# Patient Record
Sex: Female | Born: 1935 | Race: White | Hispanic: No | Smoking: Former smoker
Health system: Southern US, Community
[De-identification: ages and names within clinical notes are randomized; demographics above are authoritative.]

## PROBLEM LIST (undated history)

## (undated) DIAGNOSIS — K219 Gastro-esophageal reflux disease without esophagitis: Secondary | ICD-10-CM

## (undated) DIAGNOSIS — R269 Unspecified abnormalities of gait and mobility: Secondary | ICD-10-CM

## (undated) DIAGNOSIS — S8263XA Displaced fracture of lateral malleolus of unspecified fibula, initial encounter for closed fracture: Secondary | ICD-10-CM

## (undated) DIAGNOSIS — E538 Deficiency of other specified B group vitamins: Secondary | ICD-10-CM

## (undated) DIAGNOSIS — C4490 Unspecified malignant neoplasm of skin, unspecified: Secondary | ICD-10-CM

## (undated) DIAGNOSIS — E079 Disorder of thyroid, unspecified: Secondary | ICD-10-CM

## (undated) DIAGNOSIS — G3184 Mild cognitive impairment, so stated: Secondary | ICD-10-CM

## (undated) DIAGNOSIS — K602 Anal fissure, unspecified: Secondary | ICD-10-CM

## (undated) DIAGNOSIS — F329 Major depressive disorder, single episode, unspecified: Secondary | ICD-10-CM

## (undated) DIAGNOSIS — E785 Hyperlipidemia, unspecified: Secondary | ICD-10-CM

## (undated) DIAGNOSIS — E119 Type 2 diabetes mellitus without complications: Secondary | ICD-10-CM

## (undated) DIAGNOSIS — H269 Unspecified cataract: Secondary | ICD-10-CM

## (undated) DIAGNOSIS — K439 Ventral hernia without obstruction or gangrene: Secondary | ICD-10-CM

## (undated) DIAGNOSIS — R42 Dizziness and giddiness: Secondary | ICD-10-CM

## (undated) DIAGNOSIS — K5900 Constipation, unspecified: Secondary | ICD-10-CM

## (undated) DIAGNOSIS — L280 Lichen simplex chronicus: Secondary | ICD-10-CM

## (undated) DIAGNOSIS — I10 Essential (primary) hypertension: Secondary | ICD-10-CM

## (undated) DIAGNOSIS — F32A Depression, unspecified: Secondary | ICD-10-CM

## (undated) DIAGNOSIS — K5730 Diverticulosis of large intestine without perforation or abscess without bleeding: Secondary | ICD-10-CM

## (undated) DIAGNOSIS — M1990 Unspecified osteoarthritis, unspecified site: Secondary | ICD-10-CM

## (undated) DIAGNOSIS — R5383 Other fatigue: Secondary | ICD-10-CM

## (undated) DIAGNOSIS — L408 Other psoriasis: Secondary | ICD-10-CM

## (undated) DIAGNOSIS — R5381 Other malaise: Secondary | ICD-10-CM

## (undated) DIAGNOSIS — R1314 Dysphagia, pharyngoesophageal phase: Secondary | ICD-10-CM

## (undated) DIAGNOSIS — N6019 Diffuse cystic mastopathy of unspecified breast: Secondary | ICD-10-CM

## (undated) DIAGNOSIS — D7589 Other specified diseases of blood and blood-forming organs: Secondary | ICD-10-CM

## (undated) DIAGNOSIS — N824 Other female intestinal-genital tract fistulae: Secondary | ICD-10-CM

## (undated) DIAGNOSIS — M545 Low back pain: Secondary | ICD-10-CM

## (undated) DIAGNOSIS — G4700 Insomnia, unspecified: Secondary | ICD-10-CM

## (undated) DIAGNOSIS — L639 Alopecia areata, unspecified: Secondary | ICD-10-CM

## (undated) HISTORY — DX: Major depressive disorder, single episode, unspecified: F32.9

## (undated) HISTORY — DX: Ventral hernia without obstruction or gangrene: K43.9

## (undated) HISTORY — DX: Anal fissure, unspecified: K60.2

## (undated) HISTORY — DX: Alopecia areata, unspecified: L63.9

## (undated) HISTORY — DX: Other malaise: R53.81

## (undated) HISTORY — DX: Other fatigue: R53.83

## (undated) HISTORY — DX: Mild cognitive impairment, so stated: G31.84

## (undated) HISTORY — DX: Type 2 diabetes mellitus without complications: E11.9

## (undated) HISTORY — PX: HERNIA REPAIR: SHX51

## (undated) HISTORY — DX: Displaced fracture of lateral malleolus of unspecified fibula, initial encounter for closed fracture: S82.63XA

## (undated) HISTORY — DX: Insomnia, unspecified: G47.00

## (undated) HISTORY — DX: Depression, unspecified: F32.A

## (undated) HISTORY — DX: Lichen simplex chronicus: L28.0

## (undated) HISTORY — DX: Gastro-esophageal reflux disease without esophagitis: K21.9

## (undated) HISTORY — DX: Constipation, unspecified: K59.00

## (undated) HISTORY — DX: Dysphagia, pharyngoesophageal phase: R13.14

## (undated) HISTORY — DX: Diffuse cystic mastopathy of unspecified breast: N60.19

## (undated) HISTORY — DX: Other psoriasis: L40.8

## (undated) HISTORY — DX: Hyperlipidemia, unspecified: E78.5

## (undated) HISTORY — DX: Low back pain: M54.5

## (undated) HISTORY — DX: Dizziness and giddiness: R42

## (undated) HISTORY — DX: Other specified diseases of blood and blood-forming organs: D75.89

## (undated) HISTORY — DX: Other female intestinal-genital tract fistulae: N82.4

## (undated) HISTORY — DX: Deficiency of other specified B group vitamins: E53.8

## (undated) HISTORY — DX: Diverticulosis of large intestine without perforation or abscess without bleeding: K57.30

## (undated) HISTORY — DX: Unspecified abnormalities of gait and mobility: R26.9

## (undated) HISTORY — PX: OTHER SURGICAL HISTORY: SHX169

---
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
## 1973-09-03 HISTORY — PX: BUNIONECTOMY: SHX129

## 1988-09-03 HISTORY — PX: CATARACT EXTRACTION W/ INTRAOCULAR LENS  IMPLANT, BILATERAL: SHX1307

## 1995-09-04 DIAGNOSIS — N6019 Diffuse cystic mastopathy of unspecified breast: Secondary | ICD-10-CM

## 1995-09-04 HISTORY — DX: Diffuse cystic mastopathy of unspecified breast: N60.19

## 1995-09-04 HISTORY — PX: BREAST BIOPSY: SHX20

## 1995-09-04 HISTORY — PX: BREAST LUMPECTOMY: SHX2

## 1996-09-03 HISTORY — PX: VAGINAL HYSTERECTOMY: SUR661

## 1999-01-03 LAB — HM DEXA SCAN

## 1999-09-04 DIAGNOSIS — M545 Low back pain, unspecified: Secondary | ICD-10-CM

## 1999-09-04 HISTORY — DX: Low back pain, unspecified: M54.50

## 1999-11-17 ENCOUNTER — Encounter: Admission: RE | Admit: 1999-11-17 | Discharge: 1999-11-17 | Payer: Self-pay | Admitting: *Deleted

## 1999-11-17 ENCOUNTER — Encounter: Payer: Self-pay | Admitting: *Deleted

## 2000-03-14 ENCOUNTER — Encounter: Admission: RE | Admit: 2000-03-14 | Discharge: 2000-03-14 | Payer: Self-pay | Admitting: Internal Medicine

## 2000-03-14 ENCOUNTER — Encounter: Payer: Self-pay | Admitting: Internal Medicine

## 2000-11-18 ENCOUNTER — Encounter: Payer: Self-pay | Admitting: *Deleted

## 2000-11-18 ENCOUNTER — Encounter: Admission: RE | Admit: 2000-11-18 | Discharge: 2000-11-18 | Payer: Self-pay | Admitting: *Deleted

## 2001-02-28 ENCOUNTER — Encounter: Admission: RE | Admit: 2001-02-28 | Discharge: 2001-02-28 | Payer: Self-pay | Admitting: Internal Medicine

## 2001-02-28 ENCOUNTER — Encounter: Payer: Self-pay | Admitting: Internal Medicine

## 2001-11-21 ENCOUNTER — Encounter: Payer: Self-pay | Admitting: Internal Medicine

## 2001-11-21 ENCOUNTER — Encounter: Admission: RE | Admit: 2001-11-21 | Discharge: 2001-11-21 | Payer: Self-pay | Admitting: Internal Medicine

## 2002-11-23 ENCOUNTER — Encounter: Admission: RE | Admit: 2002-11-23 | Discharge: 2002-11-23 | Payer: Self-pay | Admitting: Obstetrics and Gynecology

## 2002-11-23 ENCOUNTER — Encounter: Payer: Self-pay | Admitting: Obstetrics and Gynecology

## 2003-11-29 ENCOUNTER — Encounter: Admission: RE | Admit: 2003-11-29 | Discharge: 2003-11-29 | Payer: Self-pay | Admitting: Surgery

## 2004-08-14 ENCOUNTER — Encounter (INDEPENDENT_AMBULATORY_CARE_PROVIDER_SITE_OTHER): Payer: Self-pay | Admitting: *Deleted

## 2004-08-14 ENCOUNTER — Ambulatory Visit (HOSPITAL_COMMUNITY): Admission: RE | Admit: 2004-08-14 | Discharge: 2004-08-14 | Payer: Self-pay | Admitting: Gastroenterology

## 2004-12-05 ENCOUNTER — Encounter: Admission: RE | Admit: 2004-12-05 | Discharge: 2004-12-05 | Payer: Self-pay | Admitting: Surgery

## 2005-12-10 ENCOUNTER — Ambulatory Visit (HOSPITAL_COMMUNITY): Admission: RE | Admit: 2005-12-10 | Discharge: 2005-12-10 | Payer: Self-pay | Admitting: Internal Medicine

## 2005-12-11 ENCOUNTER — Encounter: Admission: RE | Admit: 2005-12-11 | Discharge: 2005-12-11 | Payer: Self-pay | Admitting: Surgery

## 2006-09-03 DIAGNOSIS — R269 Unspecified abnormalities of gait and mobility: Secondary | ICD-10-CM

## 2006-09-03 HISTORY — DX: Unspecified abnormalities of gait and mobility: R26.9

## 2007-01-28 ENCOUNTER — Encounter: Admission: RE | Admit: 2007-01-28 | Discharge: 2007-01-28 | Payer: Self-pay | Admitting: Surgery

## 2007-08-14 ENCOUNTER — Ambulatory Visit (HOSPITAL_COMMUNITY): Admission: RE | Admit: 2007-08-14 | Discharge: 2007-08-14 | Payer: Self-pay | Admitting: Internal Medicine

## 2007-09-09 ENCOUNTER — Encounter: Admission: RE | Admit: 2007-09-09 | Discharge: 2007-09-09 | Payer: Self-pay | Admitting: Gastroenterology

## 2007-09-23 ENCOUNTER — Ambulatory Visit (HOSPITAL_COMMUNITY): Admission: RE | Admit: 2007-09-23 | Discharge: 2007-09-23 | Payer: Self-pay | Admitting: Obstetrics and Gynecology

## 2007-10-02 ENCOUNTER — Ambulatory Visit (HOSPITAL_COMMUNITY): Admission: RE | Admit: 2007-10-02 | Discharge: 2007-10-02 | Payer: Self-pay | Admitting: Obstetrics and Gynecology

## 2007-10-15 ENCOUNTER — Ambulatory Visit (HOSPITAL_COMMUNITY): Admission: RE | Admit: 2007-10-15 | Discharge: 2007-10-15 | Payer: Self-pay | Admitting: Gastroenterology

## 2007-11-02 HISTORY — PX: COLECTOMY: SHX59

## 2007-11-06 ENCOUNTER — Inpatient Hospital Stay (HOSPITAL_COMMUNITY): Admission: RE | Admit: 2007-11-06 | Discharge: 2007-11-11 | Payer: Self-pay | Admitting: Surgery

## 2007-11-06 ENCOUNTER — Encounter (INDEPENDENT_AMBULATORY_CARE_PROVIDER_SITE_OTHER): Payer: Self-pay | Admitting: Surgery

## 2008-02-02 ENCOUNTER — Encounter: Admission: RE | Admit: 2008-02-02 | Discharge: 2008-02-02 | Payer: Self-pay | Admitting: Internal Medicine

## 2008-09-03 DIAGNOSIS — L280 Lichen simplex chronicus: Secondary | ICD-10-CM

## 2008-09-03 HISTORY — DX: Lichen simplex chronicus: L28.0

## 2009-02-03 ENCOUNTER — Encounter: Admission: RE | Admit: 2009-02-03 | Discharge: 2009-02-03 | Payer: Self-pay | Admitting: Surgery

## 2009-09-01 IMAGING — CT CT PELVIS W/ CM
3 of 5 series · 13 of 32 positions shown, 18 images · IV contrast ([ID] READICAT & [ID] OMNIP 300%)
Comparison: Noncontrast pelvic CT 09/23/07.

CLINICAL DATA: Colovaginal fistula.  Diverticulitis.  Follow-up.
ABDOMEN CT WITH CONTRAST:
TECHNIQUE: Multidetector CT imaging of the abdomen was performed following the standard protocol during bolus administration of intravenous contrast.
Contrast:  100 cc Omnipaque 300.  Oral contrast was given.
TECHNIQUE: Multidetector CT imaging of the pelvis was performed following the standard protocol during bolus administration of intravenous contrast.

[Series 2: abd pelvis · axial · 0.70mm/px · z∈[-369,-109]mm · 4 of 88 slices shown, 9 images]
[im 18/88  soft-tissue]
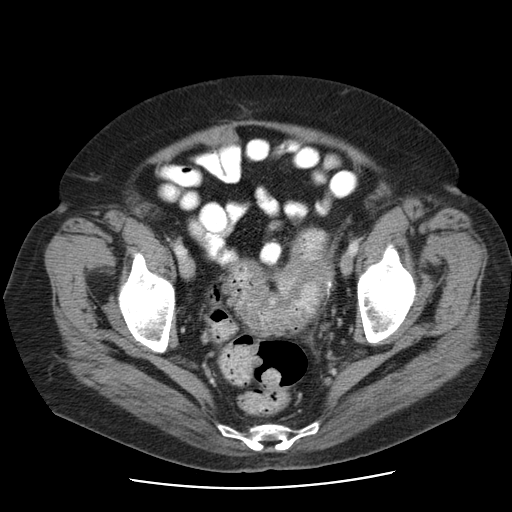
[im 18/88  lung]
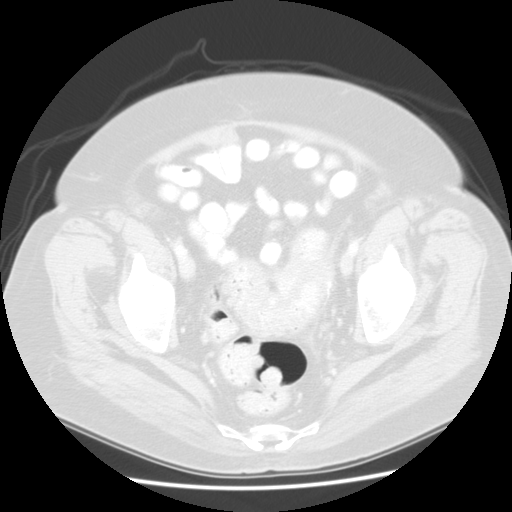
[im 18/88  bone]
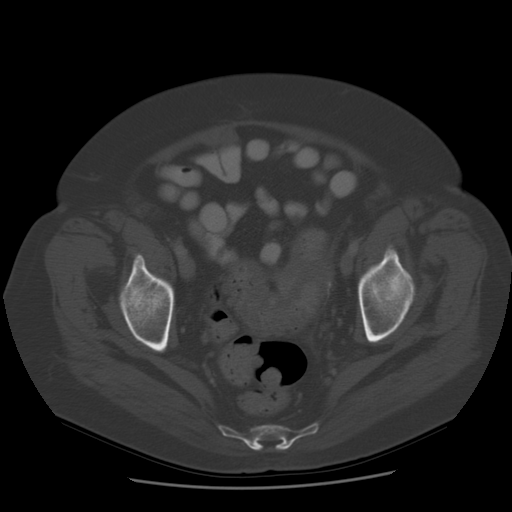
[im 35/88  soft-tissue]
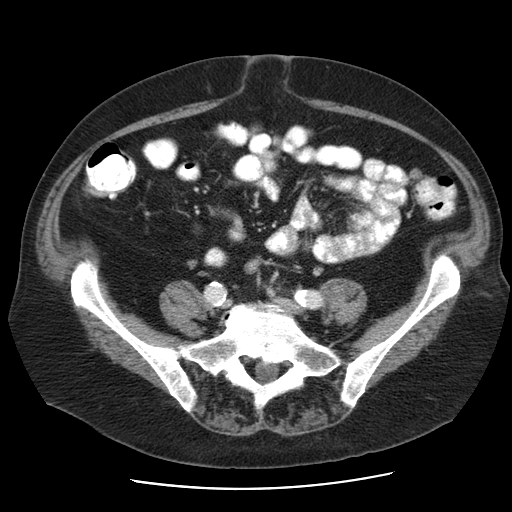
[im 35/88  lung]
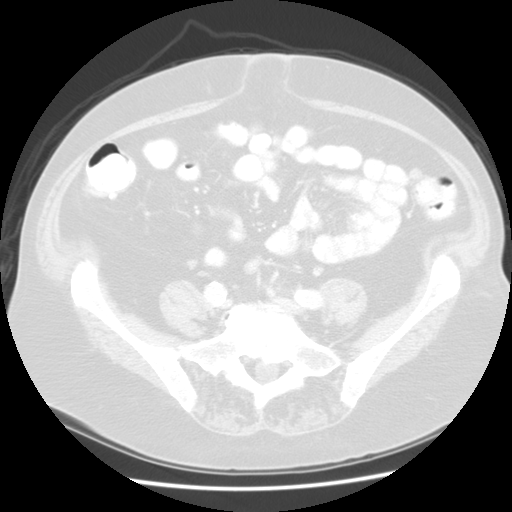
[im 53/88  soft-tissue]
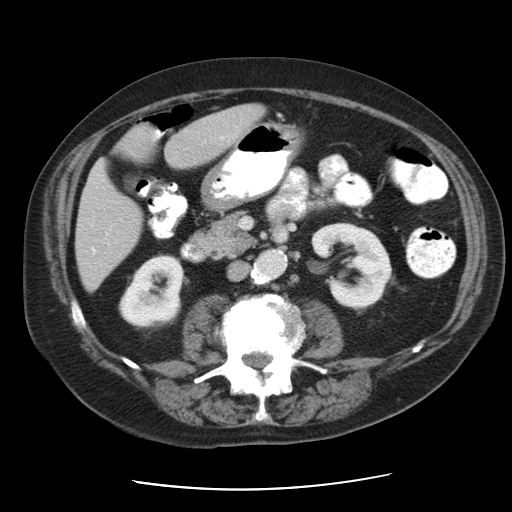
[im 53/88  lung]
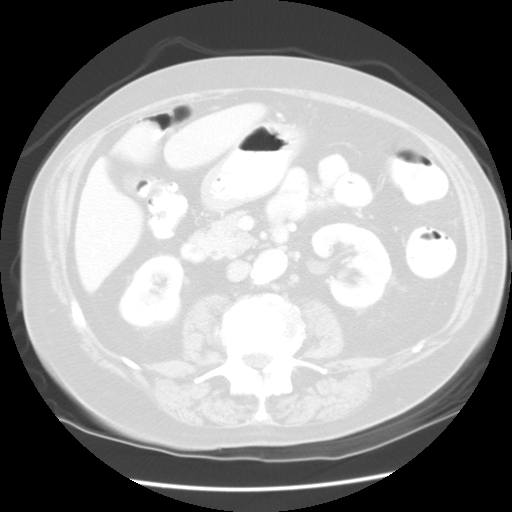
[im 70/88  soft-tissue]
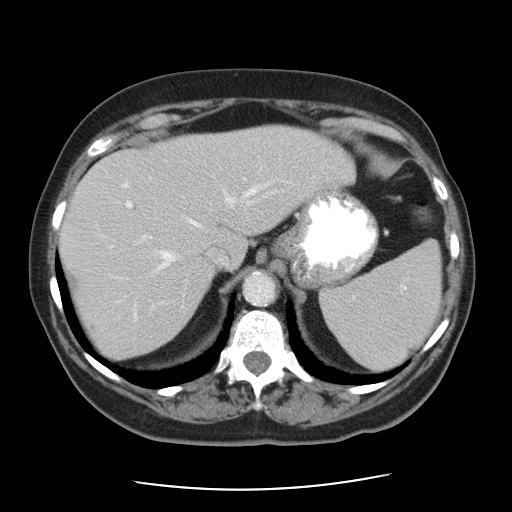
[im 70/88  lung]
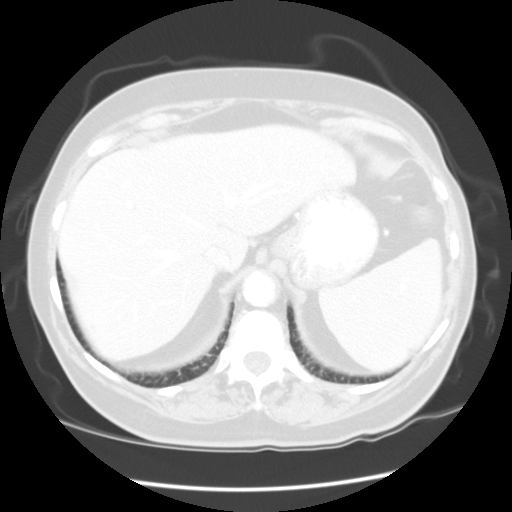

[Series 5: renal delay · axial · delayed · 0.70mm/px · z∈[-344,-234]mm · 2 of 68 slices shown]
[im 23/68  soft-tissue]
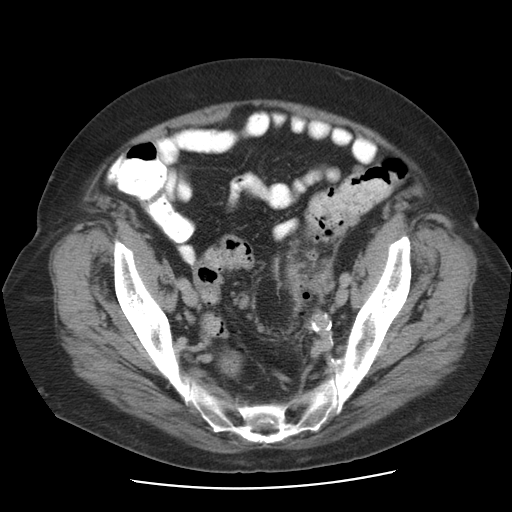
[im 45/68  soft-tissue]
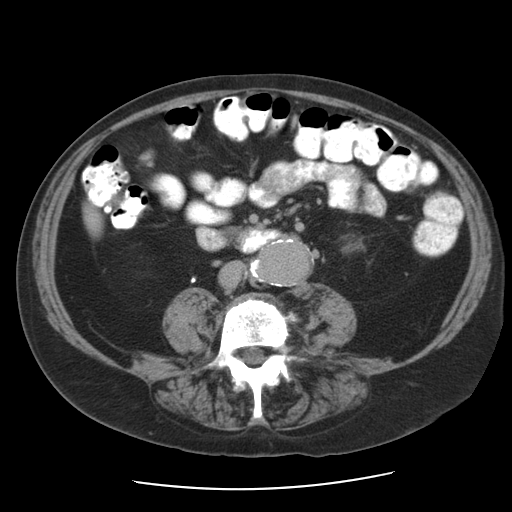

[Series 401: reformatted · sagittal · 0.87mm/px · 7 of 162 slices shown]
[im 17/162  soft-tissue]
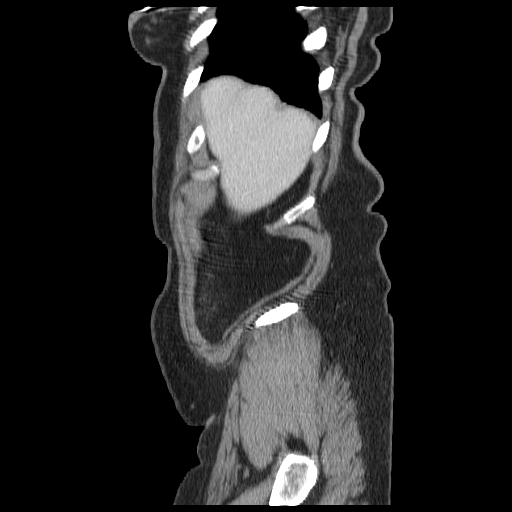
[im 33/162  soft-tissue]
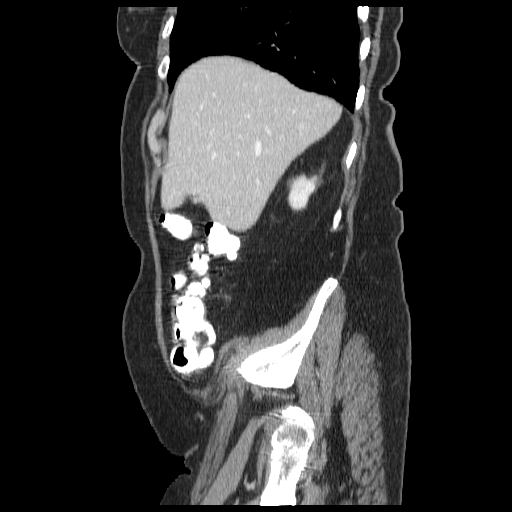
[im 49/162  soft-tissue]
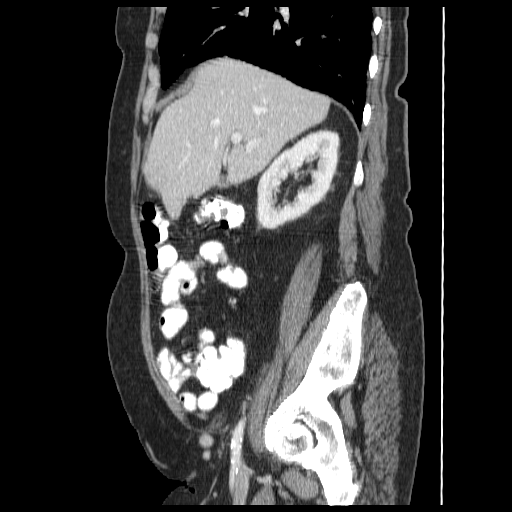
[im 65/162  soft-tissue]
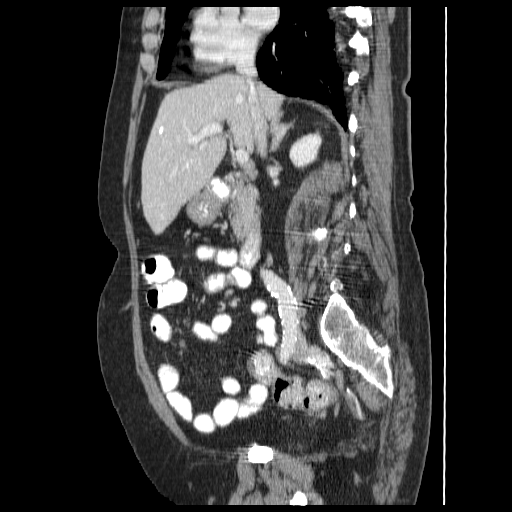
[im 97/162  soft-tissue]
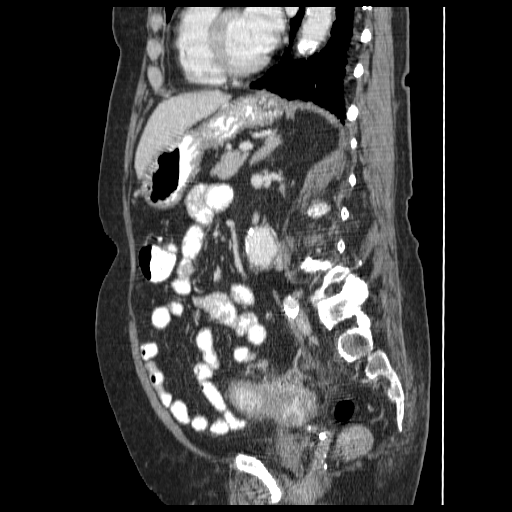
[im 113/162  soft-tissue]
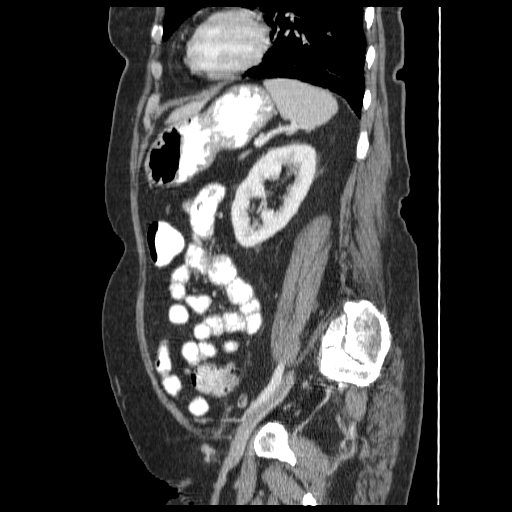
[im 129/162  soft-tissue]
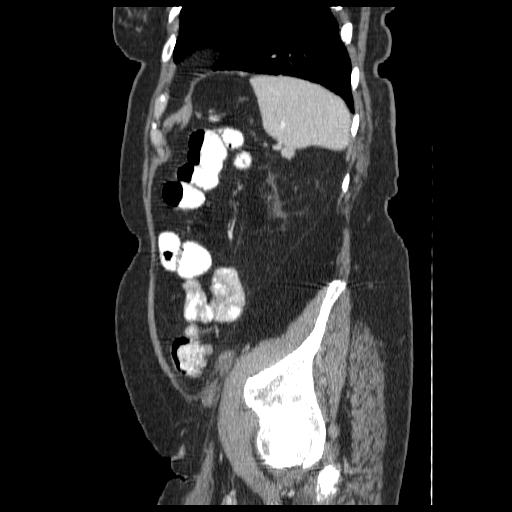

[13 of 32 positions shown; findings below may reference images not displayed]

FINDINGS: Mild fibrotic changes are present at the lung bases, asymmetric to the right.  No mass, infiltrate or pleural effusion is demonstrated.  There is diffuse atherosclerosis.  The distal aorta demonstrates fusiform dilatation with a distal aneurysm measuring up to 3.2 cm AP and 3.7 cm transverse.  
There is a subcentimeter cyst posteriorly in the right hepatic lobe on image 20.  The liver otherwise appears unremarkable.  The gallbladder is contracted with otherwise appears normal.  The spleen, pancreas, kidneys and adrenal glands appear normal.  No intraabdominal inflammatory changes or extraluminal fluid collections are present.
IMPRESSION: 1.  No acute abdominal findings demonstrated.  There is no evidence of intraabdominal inflammation or fluid collection.
2.  Aortic atherosclerosis with infrarenal abdominal aortic aneurysm.
3.  Small right hepatic cysts and basilar pulmonary fibrosis are noted incidentally.
PELVIS CT WITH CONTRAST:
FINDINGS: Diffuse sigmoid colon diverticular changes are again noted, not grossly changed from the prior examination of 9 days ago.  No extraluminal air or fluid collection is demonstrated in the surrounding fat.  There is still some air within the vagina but no contrast material is demonstrated on the current examination.  There is no air in the bladder.  Multiple pelvic phleboliths are present.  The uterus is surgically absent.  There is a small periumbilical hernia containing only fat and diffuse facet degenerative changes of the lumbar spine.  There is also extensive multilevel degenerative disc disease of the lumbar spine.
IMPRESSION: 1.  Stable to mildly improved sigmoid diverticulitis.  There is no evidence of peridiverticular abscess.
2.  Contrast material is not noted in the vagina on the current examination.  Comparison with the prior study is limited, as rectal contrast was administered previously and not for the current examination.  A persistent colovaginal fistula is not excluded, but persistent air in the vagina is not specific.

## 2010-03-02 ENCOUNTER — Encounter: Admission: RE | Admit: 2010-03-02 | Discharge: 2010-03-02 | Payer: Self-pay | Admitting: Surgery

## 2010-09-24 ENCOUNTER — Encounter: Payer: Self-pay | Admitting: Surgery

## 2011-01-16 NOTE — Op Note (Signed)
[name], [name]                 ACCOUNT NO.:  [id number]   MEDICAL RECORD NO.:  [id number]          PATIENT TYPE:  INP   LOCATION:  0004                         FACILITY:  [facility]   PHYSICIAN:  Currie Paris, M.D.DATE OF BIRTH:  Apr 07, 1927   DATE OF PROCEDURE:  11/06/2007  DATE OF DISCHARGE:                               OPERATIVE REPORT   OFFICE MEDICAL RECORD NUMBER [id number].   PREOPERATIVE DIAGNOSES:  1. Chronic sigmoid diverticulitis with colovaginal fistula.  2. Umbilical hernia.   POSTOPERATIVE DIAGNOSES:  1. Chronic sigmoid diverticulitis with colovaginal fistula.  2. Umbilical hernia.   OPERATION:  1. Sigmoid colectomy with closure of colovaginal fistula.  2. Repair, umbilical hernia.   SURGEON:  Currie Paris, M.D.   ASSISTANT:  Blima Rich, RNFA.   ANESTHESIA:  General.   CLINICAL HISTORY:  This is a 77 year old lady who has recently presented  with a colovaginal fistula that appeared to be diverticular disease.  After discussion with the patient and her daughter both in the office  and again today, we proceeded to surgery.  All their questions have been  answered.  She understood the risks and complications including  infection, bleeding, anastomotic leaks, breakdowns and needs for  colostomies.   The patient was taken to the operating room and after satisfactory  general anesthesia had been obtained, a catheter was placed and the  patient was placed in lithotomy.  The abdomen and perineal areas were  prepped and draped as a sterile field.  The time-out was performed.   I made a midline incision from the umbilicus to the distal of the  symphysis, entering the 2.5-3-cm umbilical defect.  There were virtually  no intra-abdominal abnormalities with the exception of an inflammatory  mass stuck to the left side of the vaginal cuff that appeared to be mid  sigmoid.   A wound protector and self-retaining retractors were placed and the  small bowel packed  out of the way.  I opened the peritoneum from the  descending colon and mobilized that and then came down along the  descending colon to free this up until I could get to where the  inflammatory mass was.  It appeared to really be in the mid sigmoid with  the distal sigmoid and rectum being free and mobile.  This was a very  dense inflammatory area that covered an area of probably 10 cm.  Using a  combination of cautery and blunt dissection over a fair length of time,  I was then able to free this off of the vaginal cuff, taking care to  avoid the bladder and staying really right on the colon.  I could  identify the ureter fairly far posteriorly.  As I started to this, I  would be well away from it and it did not appear to be tethered up into  any of this inflammatory tissue, which was fairly anterior.  Once I had  this completely freed up, I inspected the area of the vaginal cuff and  could see where the fistula had been, although this was all still  chronic inflammatory tissue.   I took some 2-0 Vicryls and put figure-of-eights in here to close over  the vaginal cuff area and then put some Tisseel on this to see if we  could seal this.   Attention was then turned back to the colon.  I had the distal colon  freed up somewhat and divided the colon at the very proximal end of the  sigmoid colon so that we had good, healthy colon.  I divided the  mesentery straight down to the base and then came along that so that I  could stay well away from the ureter but get the blood supply fairly  close to the base until I got beyond the area of inflammatory tissue,  then came back up to the colon.  Bowel clamps were placed here and that  was divided and the specimen handed off.   I made sure everything was dry.  I then did an anastomosis, putting a  back row of 3-0 silk Lembert sutures in followed by running chromic with  a lock in the back row and Connell for the front row, followed by an   anterior row of Lembert sutures.  I had a nice, widely patent  anastomosis that appeared to be completely healthy with no tension and  good blood supply.  This lay nicely on the left side of the pelvis.  The  small defect in the mesentery was closed with some silks as well.   I changed gloves, instruments, and removed the wound protector.  I spent  few minutes irrigating to make sure everything was dry.  I pulled the  transverse colon down and left the omentum so that it hung down to the  pelvis and tried get that so that it would seal off between where the  fistula was and the descending colon, but the anastomosis appeared to be  well away from the area of the closure of the fistula.   The abdomen was then closed with a running 0 PDS on the posterior sheath  and then running #1 looped PDS on the anterior sheath and midline at the  umbilicus, closing the defect.  The wound was then irrigated and the  skin closed with staples.   The patient tolerated the procedure well and there were no  complications.  All counts were correct.      Currie Paris, M.D.  Electronically Signed     CJS/MEDQ  D:  11/06/2007  T:  11/06/2007  Job:  [id number]   cc:   Lenon Curt. Chilton Si, M.D.  Fax: [phone]   S. Kyra Manges, M.D.  Fax: [phone]   Danise Edge, M.D.  Fax: [phone]

## 2011-01-16 NOTE — Op Note (Signed)
NAMEELISABEL, HANOVER NO.:  [id number]   MEDICAL RECORD NO.:  [id number]          PATIENT TYPE:  AMB   LOCATION:  ENDO                         FACILITY:  [facility]   PHYSICIAN:  Danise Edge, M.D.   DATE OF BIRTH:  1927/05/11   DATE OF PROCEDURE:  10/15/2007  DATE OF DISCHARGE:                               OPERATIVE REPORT   PROCEDURE:  Esophagogastroduodenoscopy   INDICATIONS:  Ms. Kripa Foskey is a 77 year old female born June 29, 1927.  Ms. Panning underwent a barium swallow x-ray which suggested high-  grade obstruction at the distal esophagus.  Ms. Fedorko reports absolutely  no difficulty swallowing.   CHRONIC MEDICATIONS:  Inderal, Claritin, 81 mg aspirin, multivitamin,  MiraLax, Silvadene external cream, hydrochlorothiazide, Climara  transdermal patch, Synthroid, Pravachol, Nexium, Caltrate, vitamin B12  injections and  potassium chloride.   PAST MEDICAL/SURGICAL HISTORY:  1. Hypothyroidism.  2. Type 2 diabetes mellitus.  3. Vitamin B12 deficiency.  4. Hyperlipidemia.  5. Hearing loss.  6. Hypertension.  7. Gastroesophageal reflux disease.  8. Left colonic diverticulosis complicated by a colovaginal fistula.  9. Fibrocystic breast disease.  10.Psoriasis.  11.Alopecia areata.  12.Lumbar radiculopathy.  13.Benign positional vertigo.  14.Insomnia.  15.Gait disorder.  16.Tonsillectomy.  17.Brachial cleft cystectomy.  18.Ovarian cystectomy.  19.Bunionectomy.  20.Bilateral cataract surgery.  21.Right breast biopsy, benign.  22.Hysterectomy.   HABITS:  Ms. Fromer smoked cigarettes for 50 years.  She stopped smoking  cigarettes in 1986.  She consumes alcohol in moderation.   ALLERGIES:  ERYTHROMYCIN.   ENDOSCOPIST:  Danise Edge, M.D.   PREMEDICATION:  Fentanyl 25 mcg and Versed 4 mg.   PROCEDURE:  After obtaining informed consent, Ms. Bleecker was placed in  the left lateral decubitus position.  I administered intravenous  fentanyl and  intravenous Versed to achieve conscious sedation for the  procedure.  The patient's blood pressure, oxygen saturation and cardiac  rhythm were monitored throughout the procedure and documented in the  medical record.   The Pentax gastroscope was passed through the posterior hypopharynx into  the proximal esophagus without difficulty.  The hypopharynx and larynx  appeared normal.  I did not visualize the vocal cords.   Esophagoscopy:  The proximal mid and lower segments of the esophageal  mucosa appeared normal.  The esophagogastric junction and distal  esophagus are widely patent.  There may be a hint of mucosal scar tissue  but certainly no high-grade obstruction.  The esophagogastric junction  and squamocolumnar junction are noted at approximately 40 cm from the  incisor teeth.  There is no endoscopic evidence for the presence of  Barrett's esophagus, erosive esophagitis, esophageal obstruction or  esophageal tumor.   Gastroscopy:  Retroflex view of the gastric cardia and fundus was  normal.  The gastric body, antrum and pylorus appeared normal.   Duodenoscopy:  The duodenal bulb and descending duodenum appeared  normal.   ASSESSMENT:  Normal esophagogastroduodenoscopy.  Savary dilation was not  performed because the patient was not experiencing any difficulty  swallowing and I did not detect any significant obstruction of the  esophagus.  ______________________________  Danise Edge, M.D.     MJ/MEDQ  D:  10/15/2007  T:  10/16/2007  Job:  [id number]

## 2011-01-16 NOTE — Discharge Summary (Signed)
Lorraine [name], Lorraine [name]                 ACCOUNT NO.:  [id number]   MEDICAL RECORD NO.:  [id number]          PATIENT TYPE:  INP   LOCATION:  1523                         FACILITY:  [facility]   PHYSICIAN:  Currie Paris, M.D.DATE OF BIRTH:  1926-09-29   DATE OF ADMISSION:  11/06/2007  DATE OF DISCHARGE:  11/11/2007                               DISCHARGE SUMMARY   FINAL DIAGNOSES:  1. Sigmoid diverticulitis with perforation and peri-intestinal abscess      formation.  2. Colovaginal fistula secondary to #1.  3. Abdominal aortic aneurysm.  4. History of vitamin B12 deficiency.  5. Hypertension.  6. Umbilical hernia.   HOSPITAL COURSE:  The patient was admitted for an elective surgical  intervention for her colovesical fistula.   The patient was taken to the operating room on March 5 where sigmoid  colectomy with closure of her fistula was performed.  She had an  umbilical hernia which was repaired during the closure of the abdomen.  A primary anastomosis was performed.   The patient tolerated the procedure well.  She had a very benign  postoperative course.  She was able to begin on some liquids a couple of  days after surgery and advance to solids.  By March 9, she was gaining  strength and by March 10 ready to be discharged to rehab at [facility].  At that point, she had bowel movements.  She was having no pain, taking  only a little bit of Advil for some minor discomfort.  Her incision  appeared to be healing nicely with no evidence of infection.  She was  tolerating solid diet.  Vital Signs had been stable, and she had no  fever.   Pathology report confirmed diverticulitis as noted in the final  diagnosis.   Laboratory studies this admission included hemoglobin 13 with a white  count of 4,700.  Urinalysis was essentially normal.   The patient is discharged to resume all of her usual home medications as  noted on the med reconciliation sheet.  She is to be followed in my  office next week.  She is okay to shower.  The patient's staples should  be removed next week when she is in the office.      Currie Paris, M.D.  Electronically Signed     CJS/MEDQ  D:  11/11/2007  T:  11/11/2007  Job:  [id number]   cc:   S. Kyra Manges, M.D.  Fax: [phone]   Lenon Curt. Chilton Si, M.D.  Fax: [phone]   Danise Edge, M.D.  Fax: [phone]

## 2011-01-19 NOTE — Op Note (Signed)
NAMEYUNUEN, Lorraine [name]                 ACCOUNT NO.:  [id number]   MEDICAL RECORD NO.:  [id number]          PATIENT TYPE:  AMB   LOCATION:  ENDO                         FACILITY:  [facility]   PHYSICIAN:  Danise Edge, M.D.   DATE OF BIRTH:  01/13/1927   DATE OF PROCEDURE:  DATE OF DISCHARGE:  08/14/2004                                 OPERATIVE REPORT   OPERATION/PROCEDURE:  Incomplete colonoscopy with sigmoid polypectomy.   INDICATIONS:  Mrs. Lorraine [name] is a 77 year old female born June 29, 1927.  In 48 Lorraine [name] underwent a colonoscopy.  Three small adenomatous  polyps were removed from her sigmoid colon.  On February 25, 1991, she underwent  a flexible proctosigmoidoscopy followed by air contrast barium enema which  did not reveal polyps.  May 31, 1999, Lorraine [name] underwent  flexible  sigmoidoscopy followed by air contrast barium enema which revealed left  colonic diverticulosis but no colorectal neoplasia.  Lorraine [name] is due for a  surveillance colonoscopy with polypectomy to prevent colon cancer.  I have  prepped her for a colonoscopy in hopes of performing a complete colonoscopy.  Due to sigmoid colonic loop formation and extensive colonic diverticulosis,  I was again unable to complete a full colonoscopy and was only able to  perform a flexible proctosigmoidoscopy to 45 cm.   ENDOSCOPIST:  Danise Edge, M.D.   PREMEDICATION:  Demerol 70 mg, Versed 7.5 mg.   DESCRIPTION OF PROCEDURE:  After obtaining informed consent, Lorraine [name] was  placed in the left lateral decubitus position.  I administered intravenous  Demerol and intravenous Versed to achieve conscious sedation for the  procedure.  The patient's blood pressure, oxygen saturation and cardiac  rhythm were monitored throughout the procedure and documented in the medical  record.   Anal inspection and digital rectal exam were normal.  The Olympus adjustable  pediatric colonoscope was introduced into the rectum and  advanced only to 45  cm from the anal verge. Despite applying external abdominal pressure and  repositioning the patient from the left lateral decubitus position to the  supine position, and finally the right lateral decubitus position, I was  unable to advance the colonoscope more proximally.  Colonic preparation for  the exam today was excellent.   Endoscopic appearance of the rectum and sigmoid colon to 45 cm from the anal  verge revealed extensive left colonic diverticulosis.  At 30 cm from the  anal verge, a 1 mm sessile polyp was removed with cold biopsy forceps.   RECOMMENDATIONS:  If Lorraine [name]'s polyp is non-neoplastic, I will not  schedule her for a further exam of her colon.  If the polyp is neoplastic,  she will require either an air contrast barium enema or virtual colonoscopy.      MJ/MEDQ  D:  08/14/2004  T:  08/14/2004  Job:  [id number]   cc:   Lenon Curt. Chilton Si, M.D.  [address]  [place]  [place] [address]  Fax: [phone]

## 2011-04-02 ENCOUNTER — Other Ambulatory Visit (INDEPENDENT_AMBULATORY_CARE_PROVIDER_SITE_OTHER): Payer: Self-pay | Admitting: Surgery

## 2011-04-02 DIAGNOSIS — Z1231 Encounter for screening mammogram for malignant neoplasm of breast: Secondary | ICD-10-CM

## 2011-04-10 ENCOUNTER — Ambulatory Visit: Payer: Self-pay

## 2011-04-18 ENCOUNTER — Ambulatory Visit
Admission: RE | Admit: 2011-04-18 | Discharge: 2011-04-18 | Disposition: A | Discharge status: Home / Self Care | Payer: Medicare Other | Source: Ambulatory Visit | Attending: Surgery | Admitting: Surgery

## 2011-04-18 DIAGNOSIS — Z1231 Encounter for screening mammogram for malignant neoplasm of breast: Secondary | ICD-10-CM

## 2011-05-28 LAB — DIFFERENTIAL
Basophils Relative: 1
Eosinophils Absolute: 0.1
Eosinophils Relative: 3
Lymphocytes Relative: 19
Monocytes Absolute: 0.3
Monocytes Relative: 7
Neutro Abs: 3.4
Neutrophils Relative %: 71

## 2011-05-28 LAB — CBC
HCT: 29.7 — ABNORMAL LOW
Hemoglobin: 10.4 — ABNORMAL LOW
MCHC: 35
MCHC: 35.5
MCV: 100.5 — ABNORMAL HIGH
MCV: 101 — ABNORMAL HIGH
MCV: 99.7
Platelets: 130 — ABNORMAL LOW
Platelets: 134 — ABNORMAL LOW
RBC: 2.94 — ABNORMAL LOW
RBC: 3.74 — ABNORMAL LOW
RDW: 13.3
RDW: 13.8
WBC: 4.7
WBC: 5.4

## 2011-05-28 LAB — COMPREHENSIVE METABOLIC PANEL
AST: 93 — ABNORMAL HIGH
Albumin: 3.5
Alkaline Phosphatase: 61
Chloride: 96
Creatinine, Ser: 0.73
GFR calc Af Amer: >60
GFR calc non Af Amer: >60
Total Bilirubin: 1
Total Protein: 7.2

## 2011-05-28 LAB — IRON: Iron: 55

## 2011-05-28 LAB — BASIC METABOLIC PANEL
CO2: 27
Chloride: 103
Sodium: 134 — ABNORMAL LOW

## 2011-05-28 LAB — URINALYSIS, ROUTINE W REFLEX MICROSCOPIC
Bilirubin Urine: NEGATIVE
Urobilinogen, UA: 1
pH: 6

## 2011-05-28 LAB — URINE MICROSCOPIC-ADD ON

## 2011-05-28 LAB — CREATININE, SERUM: GFR calc Af Amer: >60

## 2011-09-13 DIAGNOSIS — Z79899 Other long term (current) drug therapy: Secondary | ICD-10-CM | POA: Diagnosis not present

## 2011-09-13 DIAGNOSIS — E039 Hypothyroidism, unspecified: Secondary | ICD-10-CM | POA: Diagnosis not present

## 2011-09-13 DIAGNOSIS — E785 Hyperlipidemia, unspecified: Secondary | ICD-10-CM | POA: Diagnosis not present

## 2011-09-13 DIAGNOSIS — I10 Essential (primary) hypertension: Secondary | ICD-10-CM | POA: Diagnosis not present

## 2011-09-19 DIAGNOSIS — M545 Low back pain: Secondary | ICD-10-CM | POA: Diagnosis not present

## 2011-09-19 DIAGNOSIS — E785 Hyperlipidemia, unspecified: Secondary | ICD-10-CM | POA: Diagnosis not present

## 2011-09-19 DIAGNOSIS — I10 Essential (primary) hypertension: Secondary | ICD-10-CM | POA: Diagnosis not present

## 2011-09-19 DIAGNOSIS — E538 Deficiency of other specified B group vitamins: Secondary | ICD-10-CM | POA: Diagnosis not present

## 2011-09-19 DIAGNOSIS — D649 Anemia, unspecified: Secondary | ICD-10-CM | POA: Diagnosis not present

## 2011-09-20 ENCOUNTER — Emergency Department (HOSPITAL_COMMUNITY): Payer: Medicare Other

## 2011-09-20 ENCOUNTER — Emergency Department (HOSPITAL_COMMUNITY)
Admission: EM | Admit: 2011-09-20 | Discharge: 2011-09-21 | Disposition: A | Discharge status: Rehabilitation Facility | Payer: Medicare Other | Attending: Emergency Medicine | Admitting: Emergency Medicine

## 2011-09-20 ENCOUNTER — Encounter (HOSPITAL_COMMUNITY): Payer: Self-pay | Admitting: Family Medicine

## 2011-09-20 DIAGNOSIS — R404 Transient alteration of awareness: Secondary | ICD-10-CM | POA: Diagnosis not present

## 2011-09-20 DIAGNOSIS — S0003XA Contusion of scalp, initial encounter: Secondary | ICD-10-CM | POA: Diagnosis not present

## 2011-09-20 DIAGNOSIS — R51 Headache: Secondary | ICD-10-CM | POA: Diagnosis not present

## 2011-09-20 DIAGNOSIS — M129 Arthropathy, unspecified: Secondary | ICD-10-CM | POA: Diagnosis not present

## 2011-09-20 DIAGNOSIS — T1490XA Injury, unspecified, initial encounter: Secondary | ICD-10-CM | POA: Diagnosis not present

## 2011-09-20 DIAGNOSIS — Z85828 Personal history of other malignant neoplasm of skin: Secondary | ICD-10-CM | POA: Insufficient documentation

## 2011-09-20 DIAGNOSIS — M545 Low back pain: Secondary | ICD-10-CM | POA: Diagnosis not present

## 2011-09-20 DIAGNOSIS — E079 Disorder of thyroid, unspecified: Secondary | ICD-10-CM | POA: Insufficient documentation

## 2011-09-20 DIAGNOSIS — H269 Unspecified cataract: Secondary | ICD-10-CM | POA: Insufficient documentation

## 2011-09-20 DIAGNOSIS — R079 Chest pain, unspecified: Secondary | ICD-10-CM | POA: Diagnosis not present

## 2011-09-20 DIAGNOSIS — I10 Essential (primary) hypertension: Secondary | ICD-10-CM | POA: Diagnosis not present

## 2011-09-20 DIAGNOSIS — T148XXA Other injury of unspecified body region, initial encounter: Secondary | ICD-10-CM

## 2011-09-20 DIAGNOSIS — W19XXXA Unspecified fall, initial encounter: Secondary | ICD-10-CM | POA: Insufficient documentation

## 2011-09-20 DIAGNOSIS — S0083XA Contusion of other part of head, initial encounter: Secondary | ICD-10-CM | POA: Diagnosis not present

## 2011-09-20 DIAGNOSIS — S1093XA Contusion of unspecified part of neck, initial encounter: Secondary | ICD-10-CM | POA: Insufficient documentation

## 2011-09-20 HISTORY — DX: Essential (primary) hypertension: I10

## 2011-09-20 HISTORY — DX: Unspecified cataract: H26.9

## 2011-09-20 HISTORY — DX: Unspecified malignant neoplasm of skin, unspecified: C44.90

## 2011-09-20 HISTORY — DX: Unspecified osteoarthritis, unspecified site: M19.90

## 2011-09-20 HISTORY — DX: Disorder of thyroid, unspecified: E07.9

## 2011-09-20 NOTE — ED Notes (Signed)
Palumbo, MD at bedside. 

## 2011-09-20 NOTE — ED Notes (Signed)
Patient transported to CT 

## 2011-09-20 NOTE — ED Provider Notes (Signed)
History     CSN: [id number]  Arrival date & time 09/20/11  2231   First MD Initiated Contact with Patient 09/20/11 2259      Chief Complaint  Patient presents with  . Fall    (Consider location/radiation/quality/duration/timing/severity/associated sxs/prior treatment) Patient is a 78 y.o. female presenting with fall. The history is limited by the condition of the patient. No language interpreter was used.  Fall The accident occurred 3 to 5 hours ago. The fall occurred while walking. She fell from a height of 1 to 2 ft. She landed on a hard floor. There was no blood loss. The point of impact was the head. The pain is present in the head. The pain is at a severity of 4/10. The pain is moderate. She was ambulatory at the scene. There was alcohol use involved in the accident. Pertinent negatives include no visual change. Exacerbated by: nothing. Prehospitalization: none. She has tried nothing for the symptoms. The treatment provided no relief.    Past Medical History  Diagnosis Date  . Hypertension   . Cataracts, bilateral   . Pneumonia   . Thyroid disease   . Arthritis   . Skin cancer     Past Surgical History  Procedure Date  . Appendectomy   . Breast lumpectomy   . Hernia repair   . Colectomy   . Abdominal hysterectomy   . Tonsillectomy and adenoidectomy     No family history on file.  History  Substance Use Topics  . Smoking status: Former Games developer  . Smokeless tobacco: Never Used  . Alcohol Use: Yes     2 drinks/night    OB History    Grav Para Term Preterm Abortions TAB SAB Ect Mult Living                  Review of Systems  Unable to perform ROS   Allergies  Review of patient's allergies indicates not on file.  Home Medications  No current outpatient prescriptions on file.  BP 114/49  Pulse 80  Temp(Src) 98.5 F (36.9 C) (Oral)  Resp 16  SpO2 97%  Physical Exam  Constitutional: She appears well-developed and well-nourished.  HENT:  Right  Ear: No hemotympanum.  Left Ear: No hemotympanum.  Mouth/Throat: Oropharynx is clear and moist. No oropharyngeal exudate.  Eyes: Conjunctivae are normal. Pupils are equal, round, and reactive to light.  Neck: Normal range of motion. Neck supple. No tracheal deviation present.  Cardiovascular: Normal rate and regular rhythm.   Pulmonary/Chest: Effort normal and breath sounds normal. She has no wheezes.  Abdominal: Soft. Bowel sounds are normal.  Musculoskeletal: Normal range of motion. She exhibits no edema and no tenderness.  Neurological: She is alert. She has normal reflexes.       L5/s1 intact intact perineal sensation  Skin: Skin is warm and dry.  Psychiatric: Thought content normal.    ED Course  Procedures (including critical care time)  Labs Reviewed - No data to display No results found.   No diagnosis found.    MDM  Return for worsening symptoms        Zacheriah Stumpe K Kemya Shed-Rasch, MD 09/21/11 [id number]

## 2011-09-20 NOTE — ED Notes (Signed)
JYN:[id number]<AO> Expected date:09/20/11<BR> Expected time:10:13 PM<BR> Means of arrival:Ambulance<BR> Comments:<BR> EMS 15 GC, 84 yof fall

## 2011-09-20 NOTE — ED Notes (Signed)
Per EMS, patient from [facility]. Sent for evaluation of fall with hematoma to back of head. Staff at facility reported that patient did not remember the fall and had a change in ambulating after the fall. EMS further reports that patient had a few mixed drinks prior to fall.

## 2011-09-21 DIAGNOSIS — I119 Hypertensive heart disease without heart failure: Secondary | ICD-10-CM | POA: Diagnosis not present

## 2011-09-21 DIAGNOSIS — G40909 Epilepsy, unspecified, not intractable, without status epilepticus: Secondary | ICD-10-CM | POA: Diagnosis not present

## 2011-09-21 DIAGNOSIS — M25559 Pain in unspecified hip: Secondary | ICD-10-CM | POA: Diagnosis not present

## 2011-09-21 NOTE — ED Notes (Signed)
Communications contacted for transportation back to [facility].

## 2011-10-01 DIAGNOSIS — H43819 Vitreous degeneration, unspecified eye: Secondary | ICD-10-CM | POA: Diagnosis not present

## 2011-10-01 DIAGNOSIS — H4720 Unspecified optic atrophy: Secondary | ICD-10-CM | POA: Diagnosis not present

## 2011-10-01 DIAGNOSIS — H538 Other visual disturbances: Secondary | ICD-10-CM | POA: Diagnosis not present

## 2011-10-01 DIAGNOSIS — H04129 Dry eye syndrome of unspecified lacrimal gland: Secondary | ICD-10-CM | POA: Diagnosis not present

## 2011-10-03 DIAGNOSIS — M25559 Pain in unspecified hip: Secondary | ICD-10-CM | POA: Diagnosis not present

## 2011-10-03 DIAGNOSIS — I10 Essential (primary) hypertension: Secondary | ICD-10-CM | POA: Diagnosis not present

## 2011-10-03 DIAGNOSIS — R269 Unspecified abnormalities of gait and mobility: Secondary | ICD-10-CM | POA: Diagnosis not present

## 2011-11-21 DIAGNOSIS — L940 Localized scleroderma [morphea]: Secondary | ICD-10-CM | POA: Diagnosis not present

## 2012-03-13 DIAGNOSIS — D649 Anemia, unspecified: Secondary | ICD-10-CM | POA: Diagnosis not present

## 2012-04-14 ENCOUNTER — Other Ambulatory Visit (INDEPENDENT_AMBULATORY_CARE_PROVIDER_SITE_OTHER): Payer: Self-pay | Admitting: Surgery

## 2012-04-14 DIAGNOSIS — Z1231 Encounter for screening mammogram for malignant neoplasm of breast: Secondary | ICD-10-CM

## 2012-05-06 ENCOUNTER — Ambulatory Visit: Payer: Medicare Other

## 2012-05-12 ENCOUNTER — Ambulatory Visit
Admission: RE | Admit: 2012-05-12 | Discharge: 2012-05-12 | Disposition: A | Discharge status: Home / Self Care | Payer: Medicare Other | Source: Ambulatory Visit | Attending: Surgery | Admitting: Surgery

## 2012-05-12 DIAGNOSIS — Z1231 Encounter for screening mammogram for malignant neoplasm of breast: Secondary | ICD-10-CM | POA: Diagnosis not present

## 2012-05-20 DIAGNOSIS — L940 Localized scleroderma [morphea]: Secondary | ICD-10-CM | POA: Diagnosis not present

## 2012-06-20 DIAGNOSIS — Z23 Encounter for immunization: Secondary | ICD-10-CM | POA: Diagnosis not present

## 2012-07-08 ENCOUNTER — Emergency Department (HOSPITAL_COMMUNITY): Payer: Medicare Other

## 2012-07-08 ENCOUNTER — Inpatient Hospital Stay (HOSPITAL_COMMUNITY)
Admission: EM | Admit: 2012-07-08 | Discharge: 2012-07-09 | DRG: 069 | Disposition: A | Discharge status: Home / Self Care | Payer: Medicare Other | Attending: Internal Medicine | Admitting: Internal Medicine

## 2012-07-08 ENCOUNTER — Other Ambulatory Visit: Payer: Self-pay

## 2012-07-08 ENCOUNTER — Encounter (HOSPITAL_COMMUNITY): Payer: Self-pay | Admitting: Emergency Medicine

## 2012-07-08 DIAGNOSIS — R079 Chest pain, unspecified: Secondary | ICD-10-CM | POA: Diagnosis not present

## 2012-07-08 DIAGNOSIS — J9600 Acute respiratory failure, unspecified whether with hypoxia or hypercapnia: Secondary | ICD-10-CM | POA: Diagnosis not present

## 2012-07-08 DIAGNOSIS — N179 Acute kidney failure, unspecified: Secondary | ICD-10-CM | POA: Diagnosis present

## 2012-07-08 DIAGNOSIS — R4701 Aphasia: Secondary | ICD-10-CM | POA: Diagnosis not present

## 2012-07-08 DIAGNOSIS — I359 Nonrheumatic aortic valve disorder, unspecified: Secondary | ICD-10-CM | POA: Diagnosis not present

## 2012-07-08 DIAGNOSIS — Z66 Do not resuscitate: Secondary | ICD-10-CM | POA: Diagnosis present

## 2012-07-08 DIAGNOSIS — R4789 Other speech disturbances: Secondary | ICD-10-CM | POA: Diagnosis not present

## 2012-07-08 DIAGNOSIS — G459 Transient cerebral ischemic attack, unspecified: Principal | ICD-10-CM | POA: Diagnosis present

## 2012-07-08 DIAGNOSIS — N390 Urinary tract infection, site not specified: Secondary | ICD-10-CM

## 2012-07-08 DIAGNOSIS — I10 Essential (primary) hypertension: Secondary | ICD-10-CM | POA: Diagnosis present

## 2012-07-08 DIAGNOSIS — G40909 Epilepsy, unspecified, not intractable, without status epilepticus: Secondary | ICD-10-CM | POA: Diagnosis not present

## 2012-07-08 DIAGNOSIS — E860 Dehydration: Secondary | ICD-10-CM | POA: Diagnosis not present

## 2012-07-08 DIAGNOSIS — F29 Unspecified psychosis not due to a substance or known physiological condition: Secondary | ICD-10-CM | POA: Diagnosis not present

## 2012-07-08 DIAGNOSIS — R634 Abnormal weight loss: Secondary | ICD-10-CM | POA: Diagnosis present

## 2012-07-08 DIAGNOSIS — D649 Anemia, unspecified: Secondary | ICD-10-CM | POA: Diagnosis not present

## 2012-07-08 DIAGNOSIS — E039 Hypothyroidism, unspecified: Secondary | ICD-10-CM | POA: Diagnosis present

## 2012-07-08 LAB — LACTIC ACID, PLASMA: Lactic Acid, Venous: 0.9 mmol/L (ref 0.5–2.2)

## 2012-07-08 LAB — BASIC METABOLIC PANEL
BUN: 36 mg/dL — ABNORMAL HIGH (ref 6–23)
CO2: 20 mEq/L (ref 19–32)
Glucose, Bld: 139 mg/dL — ABNORMAL HIGH (ref 70–99)
Potassium: 3.8 mEq/L (ref 3.5–5.1)
Sodium: 135 mEq/L (ref 135–145)

## 2012-07-08 LAB — URINALYSIS, ROUTINE W REFLEX MICROSCOPIC
Ketones, ur: NEGATIVE mg/dL
Nitrite: NEGATIVE
Protein, ur: NEGATIVE mg/dL
Urobilinogen, UA: 1 mg/dL (ref 0.0–1.0)

## 2012-07-08 LAB — CBC
HCT: 34.3 % — ABNORMAL LOW (ref 36.0–46.0)
Hemoglobin: 11.8 g/dL — ABNORMAL LOW (ref 12.0–15.0)
MCHC: 34.4 g/dL (ref 30.0–36.0)
RBC: 3.48 MIL/uL — ABNORMAL LOW (ref 3.87–5.11)

## 2012-07-08 LAB — URINE MICROSCOPIC-ADD ON

## 2012-07-08 LAB — GLUCOSE, CAPILLARY: Glucose-Capillary: 150 mg/dL — ABNORMAL HIGH (ref 70–99)

## 2012-07-08 MED ORDER — SODIUM CHLORIDE 0.9 % IV SOLN
Freq: Once | INTRAVENOUS | Status: AC
  Administered 2012-07-08 (×2): via INTRAVENOUS

## 2012-07-08 MED ORDER — SODIUM CHLORIDE 0.9 % IV SOLN: Freq: Once | INTRAVENOUS | Status: DC

## 2012-07-08 MED ORDER — SODIUM CHLORIDE 0.9 % IV SOLN
INTRAVENOUS | Status: AC
  Administered 2012-07-08: via INTRAVENOUS

## 2012-07-08 MED ORDER — ASPIRIN 81 MG PO CHEW
324.0000 mg | CHEWABLE_TABLET | Freq: Once | ORAL | Status: AC
  Administered 2012-07-08: 324 mg via ORAL
  Filled 2012-07-08: qty 4

## 2012-07-08 NOTE — ED Provider Notes (Signed)
History     CSN: [id number]  Arrival date & time 07/08/12  2028   First MD Initiated Contact with Patient 07/08/12 2049      Chief Complaint  Patient presents with  . Visual Field Change  . Aphasia    HPI  The patient presents after an episode of garbled speech, visual field deficiency.  She notes that she is eating dinner, when her symptoms began suddenly.  Symptoms lasted approximately 5 minutes.  On resolution there's nothing residual.  On arrival the patient has no complaints.  She states that prior to the onset of symptoms she was in her usual state of health.  She notes only one similar episode, though of shorter duration of less than the symptoms.  This occurs days ago.  Past Medical History  Diagnosis Date  . Hypertension   . Cataracts, bilateral   . Pneumonia   . Thyroid disease   . Arthritis   . Skin cancer     Past Surgical History  Procedure Date  . Appendectomy   . Breast lumpectomy   . Hernia repair   . Colectomy   . Abdominal hysterectomy   . Tonsillectomy and adenoidectomy     History reviewed. No pertinent family history.  History  Substance Use Topics  . Smoking status: Former Games developer  . Smokeless tobacco: Never Used  . Alcohol Use: Yes     Comment: 2 drinks/night    OB History    Grav Para Term Preterm Abortions TAB SAB Ect Mult Living                  Review of Systems  Constitutional:       Per HPI, otherwise negative  HENT:       Per HPI, otherwise negative  Eyes: Negative.   Respiratory:       Per HPI, otherwise negative  Cardiovascular:       Per HPI, otherwise negative  Gastrointestinal: Negative for nausea and vomiting.  Genitourinary: Negative.   Musculoskeletal:       Per HPI, otherwise negative  Skin: Negative.   Neurological:       Hpi     Allergies  Codeine and Erythromycin  Home Medications   Current Outpatient Rx  Name  Route  Sig  Dispense  Refill  . ASPIRIN EC 81 MG PO TBEC   Oral   Take 81 mg by  mouth daily.         Marland Kitchen CALCIUM CARBONATE-VITAMIN D 600-400 MG-UNIT PO TABS   Oral   Take 1 tablet by mouth daily.         Marland Kitchen CLOBETASOL PROPIONATE 0.05 % EX OINT   Topical   Apply 1 application topically 3 (three) times a week.         Marland Kitchen VITAMIN B-12 IJ   Injection   Inject 1,000 mg as directed every 30 (thirty) days.         Marland Kitchen ESTRADIOL 0.075 MG/24HR TD PTWK   Transdermal   Place 1 patch onto the skin once a week.         Marland Kitchen LEVOTHYROXINE SODIUM 50 MCG PO TABS   Oral   Take 50 mcg by mouth daily.         Marland Kitchen LORATADINE 10 MG PO TABS   Oral   Take 10 mg by mouth daily.         Marland Kitchen LOSARTAN POTASSIUM 50 MG PO TABS   Oral   Take 50  mg by mouth daily.         Marland Kitchen METOPROLOL SUCCINATE ER 25 MG PO TB24   Oral   Take 25 mg by mouth daily.         . CENTRUM PO CHEW   Oral   Chew 1 tablet by mouth daily.         Marland Kitchen OMEPRAZOLE 20 MG PO CPDR   Oral   Take 20 mg by mouth daily.         Marland Kitchen POLYETHYLENE GLYCOL 3350 PO POWD   Oral   Take 17 g by mouth daily.           BP 111/45  Temp 97.8 F (36.6 C) (Oral)  Resp 14  SpO2 94%  Physical Exam  Nursing note and vitals reviewed. Constitutional: She is oriented to person, place, and time. She appears well-developed and well-nourished. No distress.  HENT:  Head: Normocephalic and atraumatic.  Eyes: Conjunctivae normal and EOM are normal.  Cardiovascular: Normal rate and regular rhythm.   Pulmonary/Chest: Effort normal and breath sounds normal. No stridor. No respiratory distress.  Abdominal: She exhibits no distension.  Musculoskeletal: She exhibits no edema.  Neurological: She is alert and oriented to person, place, and time. She has normal strength. She displays no atrophy and no tremor. No cranial nerve deficit or sensory deficit. She exhibits normal muscle tone. She displays no seizure activity. Coordination normal.       +dismetria w l hand, no drift.  Symmetric strength  Skin: Skin is warm and dry.    Psychiatric: She has a normal mood and affect.    ED Course  Procedures (including critical care time)  Labs Reviewed  CBC - Abnormal; Notable for the following:    RBC 3.48 (*)     Hemoglobin 11.8 (*)     HCT 34.3 (*)     All other components within normal limits  BASIC METABOLIC PANEL - Abnormal; Notable for the following:    Glucose, Bld 139 (*)     BUN 36 (*)     Creatinine, Ser 1.56 (*)     Calcium 11.7 (*)     GFR calc non Af Amer 30 (*)     GFR calc Af Amer 34 (*)     All other components within normal limits  GLUCOSE, CAPILLARY - Abnormal; Notable for the following:    Glucose-Capillary 150 (*)     All other components within normal limits  TROPONIN I  LACTIC ACID, PLASMA  URINALYSIS, ROUTINE W REFLEX MICROSCOPIC   Ct Head Wo Contrast  07/08/2012  *RADIOLOGY REPORT*  Clinical Data: Visual field changes; aphasia.  CT HEAD WITHOUT CONTRAST  Technique:  Contiguous axial images were obtained from the base of the skull through the vertex without contrast.  Comparison: CT of the head performed 09/20/2011  Findings: There is no evidence of acute infarction, mass lesion, or intra- or extra-axial hemorrhage on CT.  Prominence of the ventricles and sulci reflects moderate cortical volume loss.  Cerebellar atrophy is noted.  Scattered periventricular and subcortical white matter change likely reflects small vessel ischemic microangiopathy.  The brainstem and fourth ventricle are within normal limits.  The basal ganglia are unremarkable in appearance.  The cerebral hemispheres demonstrate grossly normal gray-white differentiation. No mass effect or midline shift is seen.  There is no evidence of fracture; visualized osseous structures are unremarkable in appearance.  The visualized portions of the orbits are within normal limits.  There is minimal  partial opacification of the right maxillary sinus; the remaining paranasal sinuses and mastoid air cells are well-aerated.  No significant soft  tissue abnormalities are seen.  IMPRESSION:  1.  No acute intracranial pathology seen on CT. 2.  Moderate cortical volume loss and scattered small vessel ischemic microangiopathy. 3.  Minimal partial opacification of the right maxillary sinus.   Original Report Authenticated By: Tonia Ghent, M.D.      No diagnosis found.  O2- 99%ra, normal  Cardiac: 73sr, ivcd, abnormal   Date: 07/08/2012  Rate: 723  Rhythm: normal sinus rhythm  QRS Axis: left  Intervals: normal  ST/T Wave abnormalities: normal  Conduction Disutrbances:nonspecific intraventricular conduction delay  Narrative Interpretation:   Old EKG Reviewed: changes noted Increased ivcd, abnormal  11:10 PM I discussed the patient's case with our neurologist, states that the patient can stay at this facility for the remainder of her evaluation for her altered mental status. MDM  This pleasant elderly female presents after an episode of transient neurologic changes, now asymptomatic.  On exam she has full recall of the event, and no notable neurologic deficiencies.  Vital signs are unremarkable.  Given her description of to that event, as well as her endorsement of one similar episode, there suspicion of TIA.  Patient's labs are notable for elevated creatinine, though this is in comparison to labs from several years ago, and may simply reflect dehydration.  I discussed the patient's case with our neurologist, as well as the hospitalist, who will admit the patient for further evaluation and management      Gerhard Munch, MD 07/08/12 2311

## 2012-07-08 NOTE — ED Notes (Signed)
Pt was not able to urinate at the time

## 2012-07-08 NOTE — ED Notes (Signed)
Brought in by EMS from [facility] from her independent living apartment with c/o sudden difficulty speaking and sudden visual change with "head discomfort"-- symptoms lasted for approximately 5 minutes.  Per EMS, pt was within her norms upon their arrival--- no head discomfort, speech clear, vision okay.

## 2012-07-09 ENCOUNTER — Observation Stay (HOSPITAL_COMMUNITY): Payer: Medicare Other

## 2012-07-09 DIAGNOSIS — N179 Acute kidney failure, unspecified: Secondary | ICD-10-CM | POA: Diagnosis not present

## 2012-07-09 DIAGNOSIS — E039 Hypothyroidism, unspecified: Secondary | ICD-10-CM | POA: Diagnosis present

## 2012-07-09 DIAGNOSIS — F29 Unspecified psychosis not due to a substance or known physiological condition: Secondary | ICD-10-CM | POA: Diagnosis not present

## 2012-07-09 DIAGNOSIS — D649 Anemia, unspecified: Secondary | ICD-10-CM | POA: Diagnosis present

## 2012-07-09 DIAGNOSIS — I359 Nonrheumatic aortic valve disorder, unspecified: Secondary | ICD-10-CM

## 2012-07-09 DIAGNOSIS — E860 Dehydration: Secondary | ICD-10-CM | POA: Diagnosis not present

## 2012-07-09 DIAGNOSIS — G459 Transient cerebral ischemic attack, unspecified: Secondary | ICD-10-CM | POA: Diagnosis not present

## 2012-07-09 DIAGNOSIS — I10 Essential (primary) hypertension: Secondary | ICD-10-CM | POA: Diagnosis present

## 2012-07-09 DIAGNOSIS — R634 Abnormal weight loss: Secondary | ICD-10-CM | POA: Diagnosis present

## 2012-07-09 LAB — RAPID URINE DRUG SCREEN, HOSP PERFORMED
Benzodiazepines: NOT DETECTED
Cocaine: NOT DETECTED

## 2012-07-09 LAB — BASIC METABOLIC PANEL
BUN: 29 mg/dL — ABNORMAL HIGH (ref 6–23)
Chloride: 107 mEq/L (ref 96–112)
Creatinine, Ser: 1.37 mg/dL — ABNORMAL HIGH (ref 0.50–1.10)
GFR calc Af Amer: 40 mL/min — ABNORMAL LOW (ref >90)
Glucose, Bld: 136 mg/dL — ABNORMAL HIGH (ref 70–99)

## 2012-07-09 LAB — LIPID PANEL
Cholesterol: 161 mg/dL (ref 0–200)
HDL: 46 mg/dL (ref >39)
Total CHOL/HDL Ratio: 3.5 RATIO
VLDL: 32 mg/dL (ref 0–40)

## 2012-07-09 LAB — HEMOGLOBIN A1C
Hgb A1c MFr Bld: 5.1 % (ref <5.7)
Mean Plasma Glucose: 100 mg/dL (ref <117)

## 2012-07-09 MED ORDER — ASPIRIN 325 MG PO TABS: 325.0000 mg | ORAL_TABLET | Freq: Every day | ORAL | Status: DC

## 2012-07-09 MED ORDER — CLOBETASOL PROPIONATE 0.05 % EX OINT
1.0000 "application " | TOPICAL_OINTMENT | CUTANEOUS | Status: DC
  Filled 2012-07-09: qty 15

## 2012-07-09 MED ORDER — FOLIC ACID 1 MG PO TABS
1.0000 mg | ORAL_TABLET | Freq: Every day | ORAL | Status: DC
  Administered 2012-07-09: 1 mg via ORAL
  Filled 2012-07-09: qty 1

## 2012-07-09 MED ORDER — THIAMINE HCL 100 MG/ML IJ SOLN
100.0000 mg | Freq: Every day | INTRAMUSCULAR | Status: DC
  Filled 2012-07-09: qty 1

## 2012-07-09 MED ORDER — ADULT MULTIVITAMIN W/MINERALS CH
1.0000 | ORAL_TABLET | Freq: Every day | ORAL | Status: DC
  Administered 2012-07-09: 1 via ORAL
  Filled 2012-07-09: qty 1

## 2012-07-09 MED ORDER — METOPROLOL SUCCINATE ER 25 MG PO TB24
25.0000 mg | ORAL_TABLET | Freq: Every day | ORAL | Status: DC
  Administered 2012-07-09: 25 mg via ORAL
  Filled 2012-07-09: qty 1

## 2012-07-09 MED ORDER — LEVOTHYROXINE SODIUM 50 MCG PO TABS
50.0000 ug | ORAL_TABLET | Freq: Every day | ORAL | Status: DC
  Administered 2012-07-09: 50 ug via ORAL
  Filled 2012-07-09: qty 1

## 2012-07-09 MED ORDER — LORATADINE 10 MG PO TABS
10.0000 mg | ORAL_TABLET | Freq: Every day | ORAL | Status: DC
  Administered 2012-07-09: 10 mg via ORAL
  Filled 2012-07-09: qty 1

## 2012-07-09 MED ORDER — ACETAMINOPHEN 325 MG PO TABS: 650.0000 mg | ORAL_TABLET | ORAL | Status: DC | PRN

## 2012-07-09 MED ORDER — ENOXAPARIN SODIUM 30 MG/0.3ML ~~LOC~~ SOLN
30.0000 mg | SUBCUTANEOUS | Status: DC
  Administered 2012-07-09: 30 mg via SUBCUTANEOUS
  Filled 2012-07-09: qty 0.3

## 2012-07-09 MED ORDER — LORAZEPAM 2 MG/ML IJ SOLN: 1.0000 mg | Freq: Four times a day (QID) | INTRAMUSCULAR | Status: DC | PRN

## 2012-07-09 MED ORDER — VITAMIN B-1 100 MG PO TABS
100.0000 mg | ORAL_TABLET | Freq: Every day | ORAL | Status: DC
  Administered 2012-07-09: 100 mg via ORAL
  Filled 2012-07-09: qty 1

## 2012-07-09 MED ORDER — CENTRUM PO CHEW: 1.0000 | CHEWABLE_TABLET | Freq: Every day | ORAL | Status: DC

## 2012-07-09 MED ORDER — POLYETHYLENE GLYCOL 3350 17 GM/SCOOP PO POWD
17.0000 g | Freq: Every day | ORAL | Status: DC
  Filled 2012-07-09: qty 255

## 2012-07-09 MED ORDER — PANTOPRAZOLE SODIUM 40 MG PO TBEC
40.0000 mg | DELAYED_RELEASE_TABLET | Freq: Every day | ORAL | Status: DC
  Administered 2012-07-09: 40 mg via ORAL
  Filled 2012-07-09: qty 1

## 2012-07-09 MED ORDER — LOSARTAN POTASSIUM 50 MG PO TABS
50.0000 mg | ORAL_TABLET | Freq: Every day | ORAL | Status: DC
  Administered 2012-07-09: 50 mg via ORAL
  Filled 2012-07-09: qty 1

## 2012-07-09 MED ORDER — LORAZEPAM 1 MG PO TABS: 1.0000 mg | ORAL_TABLET | Freq: Four times a day (QID) | ORAL | Status: DC | PRN

## 2012-07-09 MED ORDER — POLYETHYLENE GLYCOL 3350 17 G PO PACK
17.0000 g | PACK | Freq: Every day | ORAL | Status: DC
  Administered 2012-07-09: 17 g via ORAL
  Filled 2012-07-09: qty 1

## 2012-07-09 MED ORDER — ESTRADIOL 0.075 MG/24HR TD PTWK
0.0750 mg | MEDICATED_PATCH | TRANSDERMAL | Status: DC
  Administered 2012-07-09: 0.075 mg via TRANSDERMAL
  Filled 2012-07-09: qty 1

## 2012-07-09 NOTE — Progress Notes (Signed)
   CARE MANAGEMENT NOTE 07/09/2012  Patient:  Lorraine [name], Lorraine [name]   Account Number:  [id number]  Date Initiated:  07/09/2012  Documentation initiated by:  Jiles Crocker  Subjective/Objective Assessment:   ADMITTED WITH TIA     Action/Plan:   PATIENT RESIDES IN AN INDEPENDENT LIVING FACILITY; SOC WORKER REFERRAL PLACED   Anticipated DC Date:  07/10/2012   Anticipated DC Plan:  HOME/SELF CARE  In-house referral  Clinical Social Worker      DC Planning Services  CM consult       Status of service:  In process, will continue to follow Medicare Important Message given?  NA - LOS <3 / Initial given by admissions (If response is "NO", the following Medicare IM given date fields will be blank)  Per UR Regulation:  Reviewed for med. necessity/level of care/duration of stay  Comments:  07/09/2012- B Iley Breeden RN,BSN,MHA

## 2012-07-09 NOTE — Progress Notes (Signed)
*  PRELIMINARY RESULTS* Vascular Ultrasound Carotid Duplex (Doppler) has been completed.  Preliminary findings: Bilateral:  No evidence of hemodynamically significant internal carotid artery stenosis.   Vertebral artery flow is antegrade.      Farrel Demark, RDMS, RVT 07/09/2012, 9:12 AM

## 2012-07-09 NOTE — H&P (Addendum)
[facility] History and Physical  Lorraine [name] AVW:[id number] DOB: 1927-03-17 DOA: 07/08/2012  Referring physician: Dr. Gerhard Munch. PCP: Kimber Relic, MD  Specialists: None  Chief Complaint: Transient speech difficulty.  HPI: Lorraine [name] is a 78 y.o. female with history of HTN, hypothyroidism, a resident of independent living at [facility], presents to the emergency department secondary to transient speech difficulty. She was in her usual state of health until approximately 6:30 PM on 11/5. She was having dinner with her girlfriend. She suddenly felt "funny in her head" and noticed speech abnormality-garbled speech. Her vision felt different but she denied any deficits, diplopia or blurred vision. She indicates that her friends couldn't understand her speech. Denies headache, facial numbness or asymmetry, numbness or weakness of her limbs. No loss of consciousness. No chest pain or palpitations. Symptoms apparently lasted less than 5 minutes and resolved spontaneously. In the ED CT head was negative for acute findings, creatinine was elevated at 1.5. EDP and discussed with no hospitalist on-call and was advised to admit the [facility] for further evaluation of presumed TIA. Currently patient denies any symptoms.   Review of Systems: All systems reviewed and apart from history of presenting illness is pertinent for weight loss. Patient indicates that she has lost about 20 pounds since January this year. Some of it she says is voluntary however she thinks that she has lost more weight than she expected. She complains of poor appetite but no dysphagia or change in bowel habits or rectal bleeding. She claims she had a colonoscopy within 2 years which was normal. She also does not drink enough fluids. Other than this review of systems are negative.  Past Medical History  Diagnosis Date  . Hypertension   . Cataracts, bilateral   . Pneumonia   . Thyroid disease   . Arthritis    . Skin cancer    Past Surgical History  Procedure Date  . Appendectomy   . Breast lumpectomy   . Hernia repair   . Colectomy   . Abdominal hysterectomy   . Tonsillectomy and adenoidectomy    Social History:  reports that she has quit smoking. She has never used smokeless tobacco. She reports that she drinks alcohol. She reports that she does not use illicit drugs. Patient is married and lives at independent living part of [facility] nursing facility. She ambulates with the help of a walker.   Allergies  Allergen Reactions  . Codeine     Unknown   . Erythromycin     Unknown     History reviewed. No pertinent family history. patient denies any family history.  Prior to Admission medications   Medication Sig Start Date End Date Taking? Authorizing Provider  aspirin EC 81 MG tablet Take 81 mg by mouth daily.   Yes Historical Provider, MD  Calcium Carbonate-Vitamin D (CALTRATE 600+D) 600-400 MG-UNIT per tablet Take 1 tablet by mouth daily.   Yes Historical Provider, MD  clobetasol ointment (TEMOVATE) 0.05 % Apply 1 application topically 3 (three) times a week.   Yes Historical Provider, MD  Cyanocobalamin (VITAMIN B-12 IJ) Inject 1,000 mg as directed every 30 (thirty) days.   Yes Historical Provider, MD  estradiol (CLIMARA - DOSED IN MG/24 HR) 0.075 mg/24hr Place 1 patch onto the skin once a week.   Yes Historical Provider, MD  levothyroxine (SYNTHROID, LEVOTHROID) 50 MCG tablet Take 50 mcg by mouth daily.   Yes Historical Provider, MD  loratadine (CLARITIN) 10 MG tablet Take  10 mg by mouth daily.   Yes Historical Provider, MD  losartan (COZAAR) 50 MG tablet Take 50 mg by mouth daily.   Yes Historical Provider, MD  metoprolol succinate (TOPROL-XL) 25 MG 24 hr tablet Take 25 mg by mouth daily.   Yes Historical Provider, MD  multivitamin-iron-minerals-folic acid (CENTRUM) chewable tablet Chew 1 tablet by mouth daily.   Yes Historical Provider, MD  omeprazole (PRILOSEC) 20 MG capsule  Take 20 mg by mouth daily.   Yes Historical Provider, MD  polyethylene glycol powder (GLYCOLAX/MIRALAX) powder Take 17 g by mouth daily.   Yes Historical Provider, MD   Physical Exam: Filed Vitals:   07/08/12 2046 07/08/12 2242 07/08/12 2311  BP: 111/45 145/68   Temp: 97.8 F (36.6 C)  97.7 F (36.5 C)  TempSrc: Oral    Resp: 14 17   SpO2: 94% 98%     General exam: Moderately built and nourished female patient, lying comfortably on the gurney and is in no obvious distress.  Head, eyes and ENT: Nontraumatic and normocephalic. Pupils equally reacting to light and accommodation. Oral mucosa is mildly dry.  Neck: Supple. No JVD, carotid bruit or thyromegaly.  Lymphatics: No lymphadenopathy.  Respiratory system: Clear to auscultation. No increased work of breathing.  Cardiovascular system: S1 and S2 heard, regular rate and rhythm. No JVD, murmurs or gallops. No pedal edema.  Gastrointestinal system: Abdomen is nondistended, soft and nontender. No organomegaly or masses appreciated. Normal bowel sounds heard. Small, reducible and uncomplicated umbilical hernia.  Central nervous system: Alert and oriented. No focal neurological deficits. No pronator drift. Extremities: Symmetric 5 x 5 power. Peripheral pulses symmetrically felt.  Skin: Without any acute findings.  Musculoskeletal system: Exam is negative.  Psychiatric: Pleasant and cooperative.  Labs on Admission:  Basic Metabolic Panel:  Lab 07/08/12 1610  NA 135  K 3.8  CL 103  CO2 20  GLUCOSE 139*  BUN 36*  CREATININE 1.56*  CALCIUM 11.7*  MG --  PHOS --   Liver Function Tests: No results found for this basename: AST:5,ALT:5,ALKPHOS:5,BILITOT:5,PROT:5,ALBUMIN:5 in the last 168 hours No results found for this basename: LIPASE:5,AMYLASE:5 in the last 168 hours No results found for this basename: AMMONIA:5 in the last 168 hours CBC:  Lab 07/08/12 2045  WBC 5.3  NEUTROABS --  HGB 11.8*  HCT 34.3*  MCV 98.6  PLT 158    Cardiac Enzymes:  Lab 07/08/12 2045  CKTOTAL --  CKMB --  CKMBINDEX --  TROPONINI <0.30    BNP (last 3 results) No results found for this basename: PROBNP:3 in the last 8760 hours CBG:  Lab 07/08/12 2045  GLUCAP 150*    Radiological Exams on Admission: Dg Chest 2 View  07/08/2012  *RADIOLOGY REPORT*  Clinical Data: Chest pain.  Generalized weakness.  CHEST - 2 VIEW  Comparison: Two-view chest x-ray 09/20/2011, 11/03/2007, 12/24/1006.  Findings: Cardiac silhouette upper normal in size, accentuated by the AP technique and the degree of inspiration.  Thoracic aorta tortuous atherosclerotic, unchanged.  Hilar and mediastinal contours otherwise unremarkable.  Lungs clear.  Bronchovascular markings normal.  Pulmonary vascularity normal.  No pneumothorax. No pleural effusions.  Degenerative changes involving the thoracic spine.  IMPRESSION: No acute cardiopulmonary disease.   Original Report Authenticated By: Hulan Saas, M.D.    Ct Head Wo Contrast  07/08/2012  *RADIOLOGY REPORT*  Clinical Data: Visual field changes; aphasia.  CT HEAD WITHOUT CONTRAST  Technique:  Contiguous axial images were obtained from the base of the skull through  the vertex without contrast.  Comparison: CT of the head performed 09/20/2011  Findings: There is no evidence of acute infarction, mass lesion, or intra- or extra-axial hemorrhage on CT.  Prominence of the ventricles and sulci reflects moderate cortical volume loss.  Cerebellar atrophy is noted.  Scattered periventricular and subcortical white matter change likely reflects small vessel ischemic microangiopathy.  The brainstem and fourth ventricle are within normal limits.  The basal ganglia are unremarkable in appearance.  The cerebral hemispheres demonstrate grossly normal gray-white differentiation. No mass effect or midline shift is seen.  There is no evidence of fracture; visualized osseous structures are unremarkable in appearance.  The visualized portions  of the orbits are within normal limits.  There is minimal partial opacification of the right maxillary sinus; the remaining paranasal sinuses and mastoid air cells are well-aerated.  No significant soft tissue abnormalities are seen.  IMPRESSION:  1.  No acute intracranial pathology seen on CT. 2.  Moderate cortical volume loss and scattered small vessel ischemic microangiopathy. 3.  Minimal partial opacification of the right maxillary sinus.   Original Report Authenticated By: Tonia Ghent, M.D.     EKG: Independently reviewed. Normal sinus rhythm, incomplete right bundle branch block. No acute changes.  Assessment/Plan Principal Problem:  *TIA (transient ischemic attack) Active Problems:  Renal failure, acute  Dehydration  Hypercalcemia  Anemia  HTN (hypertension)  Hypothyroid  Excessive weight loss  UTI (lower urinary tract infection)   1. Possible TIA: Resolved symptoms. Admit for 23 hour observation. Complete TIA workup-MRI and MRA brain, 2-D echo, carotid Dopplers, hemoglobin A1c and fasting lipids. Change aspirin to 325 mg daily. PT evaluation given that patient ambulates with the help of a walker. 2. Dehydration: Due to poor oral intake. Brief IV fluids. Encourage by mouth fluid intake. 3. Acute renal failure: Possibly secondary to dehydration and ARB's. Brief IV fluids. Followup BMP in a.m. 4. Mild hypercalcemia: Secondary to problem #2. Hold calcium supplements. Consider further workup if not resolved after hydration. 5. Hypertension: Continue beta blockers and ARB's. 6. Hypothyroid: Continue Synthroid. 7. Anemia: Probably chronic. Followup CBC in a.m. 8. Profound weight loss: Unclear etiology. Will need further evaluation as outpatient-defer to PCP. 9. Alcohol use/? Abuse: Monitor for withdrawal-place on Ativan protocol.  Although UA shows large WBC and many bacteria. Nitrites are negative and there are many squamous epithelial cells. Patient does not give history of dysuria.  No fever or leukocytosis. Will await urine culture results but antibiotics were not started at this time.    Code Status: DO NOT RESUSCITATE Family Communication: Discussed directly with patient. Disposition Plan: Return to independent living at [facility] nursing facility, possibly on 11/6.  Time spent: 50 minutes  [facility] Pager [phone]  If 7PM-7AM, please contact night-coverage [email or web address] Password [email or web address] 07/09/2012, 12:54 AM

## 2012-07-09 NOTE — Progress Notes (Signed)
  Echocardiogram 2D Echocardiogram has been performed.  Cathie Beams 07/09/2012, 12:58 PM

## 2012-07-09 NOTE — Evaluation (Signed)
Physical Therapy Evaluation Patient Details Name: Lorraine [name] MRN: [id number] DOB: [birth year] Today's Date: 07/09/2012 Time: 5621-3086 PT Time Calculation (min): 17 min  PT Assessment / Plan / Recommendation Clinical Impression  Pt presents with TIA, initially with some slurred speech, however this has cleared up.  Tolerated OOB and ambulation in hallway with RW very well with no c/o pain.  She states that her mobiltiy is very close to baseline, with some generalized weakness from being in hospital. Pt will benefit from skilled PT in acute venue to address defictis.  PT recommends HHPT at independent living to maximize pts function.  She states that she has a life alert also, therefore feel she is safe to return to [facility].      PT Assessment  Patient needs continued PT services    Follow Up Recommendations  Home health PT;Supervision - Intermittent    Does the patient have the potential to tolerate intense rehabilitation      Barriers to Discharge None Has aide come in for bathing.     Equipment Recommendations       Recommendations for Other Services     Frequency Min 3X/week    Precautions / Restrictions Precautions Precautions: Fall Restrictions Weight Bearing Restrictions: No   Pertinent Vitals/Pain No pain      Mobility  Bed Mobility Bed Mobility: Supine to Sit;Sitting - Scoot to Edge of Bed Supine to Sit: 5: Supervision Sitting - Scoot to Edge of Bed: 5: Supervision Details for Bed Mobility Assistance: Supervision for safety with cues for technique.  Transfers Transfers: Sit to Stand;Stand to Sit Sit to Stand: 5: Supervision;From elevated surface;With upper extremity assist;From bed Stand to Sit: 5: Supervision;With upper extremity assist;With armrests;To chair/3-in-1 Details for Transfer Assistance: Supervision for safety with min cues for hand placement due to pt wanting to pull up with RW.  Ambulation/Gait Ambulation/Gait Assistance: 4: Min  guard Ambulation Distance (Feet): 160 Feet Assistive device: Rolling walker Ambulation/Gait Assistance Details: Min cues for maintaining position inside of RW.  Pt uses rollator at home and is used to it. Pt with slow steady gait pattern and demos no LOB during ambulation.  Gait Pattern: Step-through pattern;Decreased stride length;Trunk flexed Stairs: No Wheelchair Mobility Wheelchair Mobility: No    Shoulder Instructions     Exercises     PT Diagnosis: Difficulty walking;Generalized weakness  PT Problem List: Decreased strength;Decreased mobility;Decreased knowledge of use of DME PT Treatment Interventions: DME instruction;Gait training;Functional mobility training;Therapeutic activities;Therapeutic exercise;Balance training;Patient/family education   PT Goals Acute Rehab PT Goals PT Goal Formulation: With patient Time For Goal Achievement: 07/16/12 Potential to Achieve Goals: Good Pt will go Sit to Supine/Side: with modified independence PT Goal: Sit to Supine/Side - Progress: Goal set today Pt will go Sit to Stand: with modified independence PT Goal: Sit to Stand - Progress: Goal set today Pt will Ambulate: >150 feet;with modified independence;with least restrictive assistive device PT Goal: Ambulate - Progress: Goal set today Pt will Perform Home Exercise Program: with supervision, verbal cues required/provided PT Goal: Perform Home Exercise Program - Progress: Goal set today  Visit Information  Last PT Received On: 07/09/12 Assistance Needed: +1    Subjective Data  Subjective: I would like to go back to [facility] today.  Patient Stated Goal: to return home.    Prior Functioning  Home Living Lives With: Alone Available Help at Discharge: Personal care attendant (in Independent living at [facility]. ) Type of Home: Independent living facility Home Access: Level entry  Home Layout: One level Bathroom Shower/Tub:  (sponge baths with assist. ) Home Adaptive  Equipment: Dan Humphreys - four wheeled;Walker - rolling Additional Comments: Pt states that she was getting help with bathing from an aide that comes in .  Prior Function Level of Independence: Needs assistance Needs Assistance: Bathing Bath: Minimal Able to Take Stairs?: No Driving: No Vocation: Retired Musician: No difficulties    Cognition  Overall Cognitive Status: Appears within functional limits for tasks assessed/performed Arousal/Alertness: Awake/alert Orientation Level: Appears intact for tasks assessed Behavior During Session: [facility] for tasks performed    Extremity/Trunk Assessment Right Lower Extremity Assessment RLE ROM/Strength/Tone: WFL for tasks assessed RLE Sensation: WFL - Light Touch RLE Coordination: WFL - gross/fine motor Left Lower Extremity Assessment LLE ROM/Strength/Tone: WFL for tasks assessed LLE Sensation: WFL - Light Touch LLE Coordination: WFL - gross/fine motor Trunk Assessment Trunk Assessment: Normal   Balance    End of Session PT - End of Session Activity Tolerance: Patient tolerated treatment well Patient left: in chair;with call bell/phone within reach;with chair alarm set Nurse Communication: Mobility status  GP Functional Assessment Tool Used: Clinical judgement Functional Limitation: Mobility: Walking and moving around Mobility: Walking and Moving Around Current Status ([id number]): At least 1 percent but less than 20 percent impaired, limited or restricted Mobility: Walking and Moving Around Goal Status [phone]): At least 1 percent but less than 20 percent impaired, limited or restricted   Lessie Dings 07/09/2012, 12:23 PM

## 2012-07-09 NOTE — Progress Notes (Signed)
Per ER nurse, patient passed bedside swallow evaluation. Can not find this documented anywhere but will proceed with starting diet per that RN's report. Ginny Forth

## 2012-07-09 NOTE — Discharge Summary (Signed)
Physician Discharge Summary  Patient ID: Lorraine [name] MRN: [id number] DOB/AGE: 05-31-1927 78 y.o.  Admit date: 07/08/2012 Discharge date: 07/09/2012  Primary Care Physician:  Kimber Relic, MD   Discharge Diagnoses:    Principal Problem:  *TIA (transient ischemic attack) Active Problems:  Renal failure, acute  Dehydration  Hypercalcemia  Anemia  HTN (hypertension)  Hypothyroid  Excessive weight loss      Medication List     As of 07/09/2012  3:11 PM    STOP taking these medications         aspirin EC 81 MG tablet      TAKE these medications         aspirin 325 MG tablet   Take 1 tablet (325 mg total) by mouth daily.      CALTRATE 600+D 600-400 MG-UNIT per tablet   Generic drug: Calcium Carbonate-Vitamin D   Take 1 tablet by mouth daily.      clobetasol ointment 0.05 %   Commonly known as: TEMOVATE   Apply 1 application topically 3 (three) times a week.      estradiol 0.075 mg/24hr   Commonly known as: CLIMARA - Dosed in mg/24 hr   Place 1 patch onto the skin once a week.      levothyroxine 50 MCG tablet   Commonly known as: SYNTHROID, LEVOTHROID   Take 50 mcg by mouth daily.      loratadine 10 MG tablet   Commonly known as: CLARITIN   Take 10 mg by mouth daily.      losartan 50 MG tablet   Commonly known as: COZAAR   Take 50 mg by mouth daily.      metoprolol succinate 25 MG 24 hr tablet   Commonly known as: TOPROL-XL   Take 25 mg by mouth daily.      multivitamin-iron-minerals-folic acid chewable tablet   Chew 1 tablet by mouth daily.      omeprazole 20 MG capsule   Commonly known as: PRILOSEC   Take 20 mg by mouth daily.      polyethylene glycol powder powder   Commonly known as: GLYCOLAX/MIRALAX   Take 17 g by mouth daily.      VITAMIN B-12 IJ   Inject 1,000 mg as directed every 30 (thirty) days.         Disposition and Follow-up:  Will be discharged home today in stable condition. Needs to follow up with her PCP in no more  than 2 weeks for results of 2D ECHO and a repeat BMET for follow up on her acute renal failure.  Consults:  None   Significant Diagnostic Studies:  Dg Chest 2 View  07/08/2012  *RADIOLOGY REPORT*  Clinical Data: Chest pain.  Generalized weakness.  CHEST - 2 VIEW  Comparison: Two-view chest x-ray 09/20/2011, 11/03/2007, 12/24/1006.  Findings: Cardiac silhouette upper normal in size, accentuated by the AP technique and the degree of inspiration.  Thoracic aorta tortuous atherosclerotic, unchanged.  Hilar and mediastinal contours otherwise unremarkable.  Lungs clear.  Bronchovascular markings normal.  Pulmonary vascularity normal.  No pneumothorax. No pleural effusions.  Degenerative changes involving the thoracic spine.  IMPRESSION: No acute cardiopulmonary disease.   Original Report Authenticated By: Hulan Saas, M.D.    Ct Head Wo Contrast  07/08/2012  *RADIOLOGY REPORT*  Clinical Data: Visual field changes; aphasia.  CT HEAD WITHOUT CONTRAST  Technique:  Contiguous axial images were obtained from the base of the skull through the vertex without contrast.  Comparison:  CT of the head performed 09/20/2011  Findings: There is no evidence of acute infarction, mass lesion, or intra- or extra-axial hemorrhage on CT.  Prominence of the ventricles and sulci reflects moderate cortical volume loss.  Cerebellar atrophy is noted.  Scattered periventricular and subcortical white matter change likely reflects small vessel ischemic microangiopathy.  The brainstem and fourth ventricle are within normal limits.  The basal ganglia are unremarkable in appearance.  The cerebral hemispheres demonstrate grossly normal gray-white differentiation. No mass effect or midline shift is seen.  There is no evidence of fracture; visualized osseous structures are unremarkable in appearance.  The visualized portions of the orbits are within normal limits.  There is minimal partial opacification of the right maxillary sinus; the  remaining paranasal sinuses and mastoid air cells are well-aerated.  No significant soft tissue abnormalities are seen.  IMPRESSION:  1.  No acute intracranial pathology seen on CT. 2.  Moderate cortical volume loss and scattered small vessel ischemic microangiopathy. 3.  Minimal partial opacification of the right maxillary sinus.   Original Report Authenticated By: Tonia Ghent, M.D.    Mri Brain Without Contrast  07/09/2012  *RADIOLOGY REPORT*  Clinical Data: Possible CVA.  TIA like symptoms.  Confusion and visual field changes.  Aphasia.  MRI HEAD WITHOUT CONTRAST  Technique:  Multiplanar, multiecho pulse sequences of the brain and surrounding structures were obtained according to standard protocol without intravenous contrast.  Comparison: CT head 07/08/2012  Findings: The patient had difficulty remaining motionless for the study.  Images are suboptimal.  Small or subtle lesions could be overlooked.  There is no evidence for acute infarction, intracranial hemorrhage, mass lesion, hydrocephalus, or extra-axial fluid.  There is moderate atrophy with predominately periventricular white matter signal abnormality representing chronic microvascular ischemic change.  No foci of chronic hemorrhage.  The major intracranial vascular structures widely patent.  Left vertebral ends in P ICA. Bilateral cataract extraction.  Suspected optic atrophy.  No worrisome osseous lesions.  Empty sella.  Negative  cerebellar tonsils.  IMPRESSION: Chronic changes as described.  No acute intracranial findings.   Original Report Authenticated By: Davonna Belling, M.D.     Brief H and P: For complete details please refer to admission H and P, but in brief patient is an  78 y.o. female with history of HTN, hypothyroidism, a resident of independent living at [facility], presents to the emergency department secondary to transient speech difficulty. She was in her usual state of health until approximately 6:30 PM on 11/5. She was having  dinner with her girlfriend. She suddenly felt "funny in her head" and noticed speech abnormality-garbled speech. Her vision felt different but she denied any deficits, diplopia or blurred vision. She indicates that her friends couldn't understand her speech. Denies headache, facial numbness or asymmetry, numbness or weakness of her limbs. No loss of consciousness. No chest pain or palpitations. Symptoms apparently lasted less than 5 minutes and resolved spontaneously. In the ED CT head was negative for acute findings, creatinine was elevated at 1.5.  We were asked to admit her for further evaluation and management.     Hospital Course:  Principal Problem:  *TIA (transient ischemic attack) Active Problems:  Renal failure, acute  Dehydration  Hypercalcemia  Anemia  HTN (hypertension)  Hypothyroid  Excessive weight loss   TIA -Symptoms resolved even prior to admission. -MRI negative for acute CVA. -Carotid dopplers without ICA stenosis. -2D ECHO performed but results not available at time of DC. -ASA has been increased to  325 mg. -LDL 83, so no statin has been prescribed. -Advised to follow up with her PCP for results of her 2D ECHO.  Hypercalcemia, mild -Resolved with IVF hydration.  ARF -Cr down to 1.3 from 1.57 on admission. -Suspect this is all prerenal given patient admits to low fluid intake. -Recheck renal function at time of follow up with PCP.    Time spent on Discharge: Greater than 30 minutes.  SignedChaya Jan [facility] Pager: [phone] 07/09/2012, 3:11 PM

## 2012-07-09 NOTE — ED Notes (Signed)
Pt ambulated with writer to the bathroom w/ no dizziness,

## 2012-07-10 LAB — URINE CULTURE: Colony Count: 25000

## 2012-07-23 DIAGNOSIS — R269 Unspecified abnormalities of gait and mobility: Secondary | ICD-10-CM | POA: Diagnosis not present

## 2012-07-23 DIAGNOSIS — F329 Major depressive disorder, single episode, unspecified: Secondary | ICD-10-CM | POA: Diagnosis not present

## 2012-07-23 DIAGNOSIS — I10 Essential (primary) hypertension: Secondary | ICD-10-CM | POA: Diagnosis not present

## 2012-07-23 DIAGNOSIS — M545 Low back pain: Secondary | ICD-10-CM | POA: Diagnosis not present

## 2012-07-23 DIAGNOSIS — G459 Transient cerebral ischemic attack, unspecified: Secondary | ICD-10-CM | POA: Diagnosis not present

## 2012-07-28 ENCOUNTER — Emergency Department (HOSPITAL_COMMUNITY): Payer: Medicare Other

## 2012-07-28 ENCOUNTER — Emergency Department (HOSPITAL_COMMUNITY)
Admission: EM | Admit: 2012-07-28 | Discharge: 2012-07-28 | Disposition: A | Discharge status: Home / Self Care | Payer: Medicare Other | Attending: Emergency Medicine | Admitting: Emergency Medicine

## 2012-07-28 DIAGNOSIS — R5381 Other malaise: Secondary | ICD-10-CM | POA: Diagnosis not present

## 2012-07-28 DIAGNOSIS — Z8673 Personal history of transient ischemic attack (TIA), and cerebral infarction without residual deficits: Secondary | ICD-10-CM | POA: Diagnosis not present

## 2012-07-28 DIAGNOSIS — I739 Peripheral vascular disease, unspecified: Secondary | ICD-10-CM | POA: Diagnosis not present

## 2012-07-28 DIAGNOSIS — Z8739 Personal history of other diseases of the musculoskeletal system and connective tissue: Secondary | ICD-10-CM | POA: Insufficient documentation

## 2012-07-28 DIAGNOSIS — E079 Disorder of thyroid, unspecified: Secondary | ICD-10-CM | POA: Insufficient documentation

## 2012-07-28 DIAGNOSIS — Z7982 Long term (current) use of aspirin: Secondary | ICD-10-CM | POA: Diagnosis not present

## 2012-07-28 DIAGNOSIS — I6789 Other cerebrovascular disease: Secondary | ICD-10-CM | POA: Diagnosis not present

## 2012-07-28 DIAGNOSIS — Z8701 Personal history of pneumonia (recurrent): Secondary | ICD-10-CM | POA: Insufficient documentation

## 2012-07-28 DIAGNOSIS — Z79899 Other long term (current) drug therapy: Secondary | ICD-10-CM | POA: Diagnosis not present

## 2012-07-28 DIAGNOSIS — Z85820 Personal history of malignant melanoma of skin: Secondary | ICD-10-CM | POA: Insufficient documentation

## 2012-07-28 DIAGNOSIS — Z87891 Personal history of nicotine dependence: Secondary | ICD-10-CM | POA: Insufficient documentation

## 2012-07-28 DIAGNOSIS — R531 Weakness: Secondary | ICD-10-CM

## 2012-07-28 DIAGNOSIS — H259 Unspecified age-related cataract: Secondary | ICD-10-CM | POA: Diagnosis not present

## 2012-07-28 DIAGNOSIS — I10 Essential (primary) hypertension: Secondary | ICD-10-CM | POA: Diagnosis not present

## 2012-07-28 DIAGNOSIS — R404 Transient alteration of awareness: Secondary | ICD-10-CM | POA: Diagnosis not present

## 2012-07-28 DIAGNOSIS — R5383 Other fatigue: Secondary | ICD-10-CM | POA: Diagnosis not present

## 2012-07-28 DIAGNOSIS — R42 Dizziness and giddiness: Secondary | ICD-10-CM | POA: Diagnosis not present

## 2012-07-28 LAB — URINALYSIS, ROUTINE W REFLEX MICROSCOPIC
Glucose, UA: NEGATIVE mg/dL
Hgb urine dipstick: NEGATIVE
Ketones, ur: NEGATIVE mg/dL
Protein, ur: 30 mg/dL — AB
pH: 5.5 (ref 5.0–8.0)

## 2012-07-28 LAB — BASIC METABOLIC PANEL
BUN: 29 mg/dL — ABNORMAL HIGH (ref 6–23)
Calcium: 11.3 mg/dL — ABNORMAL HIGH (ref 8.4–10.5)
Creatinine, Ser: 1.49 mg/dL — ABNORMAL HIGH (ref 0.50–1.10)
GFR calc Af Amer: 36 mL/min — ABNORMAL LOW (ref >90)
GFR calc non Af Amer: 31 mL/min — ABNORMAL LOW (ref >90)
Potassium: 3.7 mEq/L (ref 3.5–5.1)

## 2012-07-28 LAB — CBC
HCT: 31.5 % — ABNORMAL LOW (ref 36.0–46.0)
MCH: 34.5 pg — ABNORMAL HIGH (ref 26.0–34.0)
MCHC: 34.6 g/dL (ref 30.0–36.0)
MCV: 99.7 fL (ref 78.0–100.0)
RDW: 13.7 % (ref 11.5–15.5)

## 2012-07-28 LAB — URINE MICROSCOPIC-ADD ON

## 2012-07-28 NOTE — ED Notes (Signed)
Patient transported to CT 

## 2012-07-28 NOTE — ED Notes (Addendum)
As per EMS pt was having dinner pt had sharp pain to back of head and placed head on table. Pain had gone away when EMS arrived.pt has 20g PIV in left forearm.VSS.PWD

## 2012-07-28 NOTE — ED Provider Notes (Signed)
History     CSN: [id number]  Arrival date & time 07/28/12  2009   First MD Initiated Contact with Patient 07/28/12 2102      Chief Complaint  Patient presents with  . Dizziness    (Consider location/radiation/quality/duration/timing/severity/associated sxs/prior treatment) HPI Comments: Lorraine [name] is an 78 year old female, who lives in an assisted living independently, who is a dinner tonight, with another facility, and she suddenly felt shaky and had a pain in the back of her neck, radiating up to her head.  EMS was called for transport.  She was recently hospitalized and diagnosed with TIAs.  URIs and she was seen by her primary care physician on Wednesday.  No medications were changed.  She is unsure, if she is taking Coumadin or not, although in her discharge summary it states, that she was placed on Coumadin to to her TIAs.  She states she is back to her baseline of longer feeling shaky.  She states she did eat lunch, and she did eat a salad.  For dinner before her symptoms started  The history is provided by the patient.    Past Medical History  Diagnosis Date  . Hypertension   . Cataracts, bilateral   . Pneumonia   . Thyroid disease   . Arthritis   . Skin cancer     Past Surgical History  Procedure Date  . Appendectomy   . Breast lumpectomy   . Hernia repair   . Colectomy   . Abdominal hysterectomy   . Tonsillectomy and adenoidectomy     No family history on file.  History  Substance Use Topics  . Smoking status: Former Games developer  . Smokeless tobacco: Never Used  . Alcohol Use: Yes     Comment: 2 drinks/night    OB History    Grav Para Term Preterm Abortions TAB SAB Ect Mult Living                  Review of Systems  Constitutional: Negative for fever and chills.  HENT: Negative for congestion and rhinorrhea.   Eyes: Negative for visual disturbance.  Cardiovascular: Negative for palpitations.  Genitourinary: Negative for dysuria and frequency.    Musculoskeletal: Negative for back pain.  Neurological: Negative for dizziness, syncope, weakness and light-headedness.    Allergies  Codeine and Erythromycin  Home Medications   Current Outpatient Rx  Name  Route  Sig  Dispense  Refill  . ASPIRIN 325 MG PO TABS   Oral   Take 1 tablet (325 mg total) by mouth daily.         Marland Kitchen CALCIUM CARBONATE-VITAMIN D 600-400 MG-UNIT PO TABS   Oral   Take 1 tablet by mouth daily.         Marland Kitchen CLOBETASOL PROPIONATE 0.05 % EX OINT   Topical   Apply 1 application topically 3 (three) times a week.         Marland Kitchen VITAMIN B-12 IJ   Injection   Inject 1,000 mg as directed every 30 (thirty) days.         Marland Kitchen ESTRADIOL 0.075 MG/24HR TD PTWK   Transdermal   Place 1 patch onto the skin once a week.         Marland Kitchen LEVOTHYROXINE SODIUM 50 MCG PO TABS   Oral   Take 50 mcg by mouth daily.         Marland Kitchen LOSARTAN POTASSIUM 50 MG PO TABS   Oral   Take 50 mg by mouth daily.         Marland Kitchen  METOPROLOL SUCCINATE ER 25 MG PO TB24   Oral   Take 25 mg by mouth daily.         . CENTRUM PO CHEW   Oral   Chew 1 tablet by mouth daily.         Marland Kitchen OMEPRAZOLE 20 MG PO CPDR   Oral   Take 20 mg by mouth daily.         Marland Kitchen POLYETHYLENE GLYCOL 3350 PO POWD   Oral   Take 17 g by mouth daily.           BP 134/66  Pulse 72  Temp 98.1 F (36.7 C) (Oral)  Resp 20  SpO2 95%  Physical Exam  Constitutional: She appears well-developed and well-nourished.  HENT:  Head: Normocephalic.  Eyes: Pupils are equal, round, and reactive to light.  Neck: Normal range of motion.  Cardiovascular: Normal rate.   Pulmonary/Chest: Effort normal.  Abdominal: Soft.  Musculoskeletal: Normal range of motion. She exhibits no edema and no tenderness.  Neurological: She is alert.  Skin: Skin is warm. No pallor.    ED Course  Procedures (including critical care time)  Labs Reviewed  CBC - Abnormal; Notable for the following:    RBC 3.16 (*)     Hemoglobin 10.9 (*)      HCT 31.5 (*)     MCH 34.5 (*)     Platelets 134 (*)     All other components within normal limits  BASIC METABOLIC PANEL - Abnormal; Notable for the following:    Glucose, Bld 107 (*)     BUN 29 (*)     Creatinine, Ser 1.49 (*)     Calcium 11.3 (*)     GFR calc non Af Amer 31 (*)     GFR calc Af Amer 36 (*)     All other components within normal limits  URINALYSIS, ROUTINE W REFLEX MICROSCOPIC - Abnormal; Notable for the following:    Specific Gravity, Urine 1.031 (*)     Bilirubin Urine SMALL (*)     Protein, ur 30 (*)     All other components within normal limits  URINE MICROSCOPIC-ADD ON - Abnormal; Notable for the following:    Squamous Epithelial / LPF FEW (*)     Bacteria, UA FEW (*)     Casts HYALINE CASTS (*)     All other components within normal limits  PROTIME-INR   Ct Head Wo Contrast  07/28/2012  *RADIOLOGY REPORT*  Clinical Data: Transient difficulty with speech; change in vision.  CT HEAD WITHOUT CONTRAST  Technique:  Contiguous axial images were obtained from the base of the skull through the vertex without contrast.  Comparison: CT of the head performed 07/08/2012, and MRI of the brain performed 07/09/2012  Findings: There is no evidence of acute infarction, mass lesion, or intra- or extra-axial hemorrhage on CT.  Prominence of the ventricles and sulci reflects moderate cortical volume loss.  Scattered periventricular and subcortical white matter change likely reflects small vessel ischemic microangiopathy.  Cerebellar atrophy is noted.  The brainstem and fourth ventricle are within normal limits.  The basal ganglia are unremarkable in appearance.  The cerebral hemispheres demonstrate grossly normal gray-white differentiation. No mass effect or midline shift is seen.  There is no evidence of fracture; visualized osseous structures are unremarkable in appearance.  The visualized portions of the orbits are within normal limits.  The paranasal sinuses and mastoid air cells are  well-aerated.  No significant  soft tissue abnormalities are seen.  IMPRESSION:  1.  No acute intracranial pathology seen on CT. 2.  Moderate cortical volume loss and scattered small vessel ischemic microangiopathy.   Original Report Authenticated By: Tonia Ghent, M.D.      1. Weakness     ED ECG REPORT   Date: 07/28/2012  EKG Time: 11:15 PM  Rate: 74  Rhythm: normal sinus rhythm,  unchanged from previous tracings  Axis: normal  Intervals:RSR" or RVH  ST&T Change: none  Narrative Interpretation: unchanged             MDM  Patient is slightly confused as to all history and medications.  Will obtain head CT.  Baseline labs, including INR, catheterized urine  Patient remained at her baseline with no further episodes of weakness or "weird feeling"       Arman Filter, NP 07/28/12 2312  Arman Filter, NP 07/28/12 2315  Arman Filter, NP 07/28/12 2316

## 2012-07-28 NOTE — ED Provider Notes (Signed)
Medical screening examination/treatment/procedure(s) were conducted as a shared visit with non-physician practitioner(s) and myself.  I personally evaluated the patient during the encounter  Lorraine [name] is a 78 y.o. female hx of HTN, here with feeling shaky around noon. She then felt weak. She has hx of TIAs. Exam nl, patient comfortable. Alert and oriented. Labs nl, UA contaminated (no obvious UTI), CT head nl. She is back to baseline. Will d/c home with outpatient f/u.   Richardean Canal, MD 07/28/12 [phone]

## 2012-07-31 NOTE — Progress Notes (Addendum)
This M.D. was called by the radiologist Dr. Lyda Kalata to provide results of MRA which was done during recent hospitalization but was delayed in reporting. MRA report is as follows.  MRA HEAD WITHOUT CONTRAST   Technique: Angiographic images of the Circle of Willis were  obtained using MRA technique without intravenous contrast.  Comparison: MR brain 07/09/2012.  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  Anterior circulation without medium or large size vessel  significant stenosis or occlusion.  Mild narrowing and irregularity of middle cerebral artery branches  bilaterally.  The left vertebral artery is not well delineated throughout its  course. It is possible the left vertebral artery predominately  ends in a left PICA distribution as a small portion of the left  PICA is visualized with tiny vessel traversing to formation of the  left basilar artery. If further delineation of the left vertebral  artery is clinically desired, MR angiogram or CT angiogram of the  neck may be considered.  No significant narrowing distal right vertebral artery. Full  extent of the right PICA not imaged.  No high-grade stenosis of the basilar artery. Poor delineation of  the AICAs.  Mild superior cerebellar artery branch vessel irregularity.  Mild irregularity of the distal branches of the posterior cerebral  artery bilaterally. Fetal type origin of the right posterior  cerebral artery.   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  The left vertebral artery is not well delineated throughout its  course. Please see above discussion.  Branch vessel irregularity as detailed above   Dr. Constance Goltz advised followup as deemed necessary, of the right periophthalmic aneurysm and consider further evaluation of left vertebral artery which was not well delineated on this exam.  Patient had been admitted from 07/08/12-07/09/12 and assessed to have TIA. On discharge her echo results were pending and  have been included below:  2-D echo Study Conclusions  - Procedure narrative: Transthoracic echocardiography. Image quality was adequate. The study was technically difficult. - Left ventricle: The cavity size was normal. There was mild concentric hypertrophy. Systolic function was normal. The estimated ejection fraction was in the range of 60% to 65%. - Aortic valve: Calcified non-coronary cusp Mild regurgitation. - Mitral valve: Calcified annulus.  Attempted to contact patient's primary care physician Dr. Murray Hodgkins on 07/18/2012 and left a message on his office voicemail but haven't yet heard back. Will forward this note to Dr. Chilton Si.   Lorraine [name] 4:55 PM

## 2012-08-06 DIAGNOSIS — G458 Other transient cerebral ischemic attacks and related syndromes: Secondary | ICD-10-CM | POA: Diagnosis not present

## 2012-08-06 DIAGNOSIS — M6281 Muscle weakness (generalized): Secondary | ICD-10-CM | POA: Diagnosis not present

## 2012-08-06 DIAGNOSIS — R269 Unspecified abnormalities of gait and mobility: Secondary | ICD-10-CM | POA: Diagnosis not present

## 2012-08-06 DIAGNOSIS — R279 Unspecified lack of coordination: Secondary | ICD-10-CM | POA: Diagnosis not present

## 2012-08-11 DIAGNOSIS — M6281 Muscle weakness (generalized): Secondary | ICD-10-CM | POA: Diagnosis not present

## 2012-08-11 DIAGNOSIS — R269 Unspecified abnormalities of gait and mobility: Secondary | ICD-10-CM | POA: Diagnosis not present

## 2012-08-11 DIAGNOSIS — R279 Unspecified lack of coordination: Secondary | ICD-10-CM | POA: Diagnosis not present

## 2012-08-11 DIAGNOSIS — G458 Other transient cerebral ischemic attacks and related syndromes: Secondary | ICD-10-CM | POA: Diagnosis not present

## 2012-08-12 DIAGNOSIS — B351 Tinea unguium: Secondary | ICD-10-CM | POA: Diagnosis not present

## 2012-08-12 DIAGNOSIS — L97509 Non-pressure chronic ulcer of other part of unspecified foot with unspecified severity: Secondary | ICD-10-CM | POA: Diagnosis not present

## 2012-08-12 DIAGNOSIS — M79609 Pain in unspecified limb: Secondary | ICD-10-CM | POA: Diagnosis not present

## 2012-08-13 DIAGNOSIS — R279 Unspecified lack of coordination: Secondary | ICD-10-CM | POA: Diagnosis not present

## 2012-08-13 DIAGNOSIS — G458 Other transient cerebral ischemic attacks and related syndromes: Secondary | ICD-10-CM | POA: Diagnosis not present

## 2012-08-13 DIAGNOSIS — M6281 Muscle weakness (generalized): Secondary | ICD-10-CM | POA: Diagnosis not present

## 2012-08-13 DIAGNOSIS — L940 Localized scleroderma [morphea]: Secondary | ICD-10-CM | POA: Diagnosis not present

## 2012-08-13 DIAGNOSIS — R269 Unspecified abnormalities of gait and mobility: Secondary | ICD-10-CM | POA: Diagnosis not present

## 2012-08-21 DIAGNOSIS — R279 Unspecified lack of coordination: Secondary | ICD-10-CM | POA: Diagnosis not present

## 2012-08-21 DIAGNOSIS — R269 Unspecified abnormalities of gait and mobility: Secondary | ICD-10-CM | POA: Diagnosis not present

## 2012-08-21 DIAGNOSIS — G458 Other transient cerebral ischemic attacks and related syndromes: Secondary | ICD-10-CM | POA: Diagnosis not present

## 2012-08-21 DIAGNOSIS — M6281 Muscle weakness (generalized): Secondary | ICD-10-CM | POA: Diagnosis not present

## 2012-09-04 DIAGNOSIS — I10 Essential (primary) hypertension: Secondary | ICD-10-CM | POA: Diagnosis not present

## 2012-09-04 DIAGNOSIS — E039 Hypothyroidism, unspecified: Secondary | ICD-10-CM | POA: Diagnosis not present

## 2012-09-04 DIAGNOSIS — D649 Anemia, unspecified: Secondary | ICD-10-CM | POA: Diagnosis not present

## 2012-09-04 DIAGNOSIS — E119 Type 2 diabetes mellitus without complications: Secondary | ICD-10-CM | POA: Diagnosis not present

## 2012-09-10 DIAGNOSIS — E785 Hyperlipidemia, unspecified: Secondary | ICD-10-CM | POA: Diagnosis not present

## 2012-09-10 DIAGNOSIS — F329 Major depressive disorder, single episode, unspecified: Secondary | ICD-10-CM | POA: Diagnosis not present

## 2012-09-10 DIAGNOSIS — D649 Anemia, unspecified: Secondary | ICD-10-CM | POA: Diagnosis not present

## 2012-09-10 DIAGNOSIS — I10 Essential (primary) hypertension: Secondary | ICD-10-CM | POA: Diagnosis not present

## 2012-09-10 DIAGNOSIS — M545 Low back pain: Secondary | ICD-10-CM | POA: Diagnosis not present

## 2012-10-14 DIAGNOSIS — M79609 Pain in unspecified limb: Secondary | ICD-10-CM | POA: Diagnosis not present

## 2012-10-14 DIAGNOSIS — L97509 Non-pressure chronic ulcer of other part of unspecified foot with unspecified severity: Secondary | ICD-10-CM | POA: Diagnosis not present

## 2012-10-14 DIAGNOSIS — B351 Tinea unguium: Secondary | ICD-10-CM | POA: Diagnosis not present

## 2012-10-22 DIAGNOSIS — K602 Anal fissure, unspecified: Secondary | ICD-10-CM | POA: Diagnosis not present

## 2012-10-22 DIAGNOSIS — K5900 Constipation, unspecified: Secondary | ICD-10-CM | POA: Diagnosis not present

## 2012-10-29 DIAGNOSIS — R41841 Cognitive communication deficit: Secondary | ICD-10-CM | POA: Diagnosis not present

## 2012-10-29 DIAGNOSIS — R159 Full incontinence of feces: Secondary | ICD-10-CM | POA: Diagnosis not present

## 2012-10-29 DIAGNOSIS — F0390 Unspecified dementia without behavioral disturbance: Secondary | ICD-10-CM | POA: Diagnosis not present

## 2012-10-29 DIAGNOSIS — R41840 Attention and concentration deficit: Secondary | ICD-10-CM | POA: Diagnosis not present

## 2012-10-29 DIAGNOSIS — R234 Changes in skin texture: Secondary | ICD-10-CM | POA: Diagnosis not present

## 2012-10-30 DIAGNOSIS — F0390 Unspecified dementia without behavioral disturbance: Secondary | ICD-10-CM | POA: Diagnosis not present

## 2012-10-30 DIAGNOSIS — R234 Changes in skin texture: Secondary | ICD-10-CM | POA: Diagnosis not present

## 2012-10-30 DIAGNOSIS — R159 Full incontinence of feces: Secondary | ICD-10-CM | POA: Diagnosis not present

## 2012-10-30 DIAGNOSIS — R41841 Cognitive communication deficit: Secondary | ICD-10-CM | POA: Diagnosis not present

## 2012-10-30 DIAGNOSIS — R41840 Attention and concentration deficit: Secondary | ICD-10-CM | POA: Diagnosis not present

## 2012-11-01 DIAGNOSIS — R41840 Attention and concentration deficit: Secondary | ICD-10-CM | POA: Diagnosis not present

## 2012-11-01 DIAGNOSIS — R159 Full incontinence of feces: Secondary | ICD-10-CM | POA: Diagnosis not present

## 2012-11-01 DIAGNOSIS — R41841 Cognitive communication deficit: Secondary | ICD-10-CM | POA: Diagnosis not present

## 2012-11-01 DIAGNOSIS — R234 Changes in skin texture: Secondary | ICD-10-CM | POA: Diagnosis not present

## 2012-11-01 DIAGNOSIS — F0390 Unspecified dementia without behavioral disturbance: Secondary | ICD-10-CM | POA: Diagnosis not present

## 2012-11-03 DIAGNOSIS — R234 Changes in skin texture: Secondary | ICD-10-CM | POA: Diagnosis not present

## 2012-11-03 DIAGNOSIS — R159 Full incontinence of feces: Secondary | ICD-10-CM | POA: Diagnosis not present

## 2012-11-03 DIAGNOSIS — R41841 Cognitive communication deficit: Secondary | ICD-10-CM | POA: Diagnosis not present

## 2012-11-03 DIAGNOSIS — R41840 Attention and concentration deficit: Secondary | ICD-10-CM | POA: Diagnosis not present

## 2012-11-03 DIAGNOSIS — F0390 Unspecified dementia without behavioral disturbance: Secondary | ICD-10-CM | POA: Diagnosis not present

## 2012-11-04 DIAGNOSIS — R41840 Attention and concentration deficit: Secondary | ICD-10-CM | POA: Diagnosis not present

## 2012-11-04 DIAGNOSIS — R41841 Cognitive communication deficit: Secondary | ICD-10-CM | POA: Diagnosis not present

## 2012-11-04 DIAGNOSIS — R159 Full incontinence of feces: Secondary | ICD-10-CM | POA: Diagnosis not present

## 2012-11-04 DIAGNOSIS — R234 Changes in skin texture: Secondary | ICD-10-CM | POA: Diagnosis not present

## 2012-11-04 DIAGNOSIS — F0390 Unspecified dementia without behavioral disturbance: Secondary | ICD-10-CM | POA: Diagnosis not present

## 2012-11-05 DIAGNOSIS — R234 Changes in skin texture: Secondary | ICD-10-CM | POA: Diagnosis not present

## 2012-11-05 DIAGNOSIS — R41841 Cognitive communication deficit: Secondary | ICD-10-CM | POA: Diagnosis not present

## 2012-11-05 DIAGNOSIS — F0390 Unspecified dementia without behavioral disturbance: Secondary | ICD-10-CM | POA: Diagnosis not present

## 2012-11-05 DIAGNOSIS — R41840 Attention and concentration deficit: Secondary | ICD-10-CM | POA: Diagnosis not present

## 2012-11-05 DIAGNOSIS — R159 Full incontinence of feces: Secondary | ICD-10-CM | POA: Diagnosis not present

## 2012-11-06 DIAGNOSIS — R159 Full incontinence of feces: Secondary | ICD-10-CM | POA: Diagnosis not present

## 2012-11-06 DIAGNOSIS — R41840 Attention and concentration deficit: Secondary | ICD-10-CM | POA: Diagnosis not present

## 2012-11-06 DIAGNOSIS — R41841 Cognitive communication deficit: Secondary | ICD-10-CM | POA: Diagnosis not present

## 2012-11-06 DIAGNOSIS — F0390 Unspecified dementia without behavioral disturbance: Secondary | ICD-10-CM | POA: Diagnosis not present

## 2012-11-06 DIAGNOSIS — R234 Changes in skin texture: Secondary | ICD-10-CM | POA: Diagnosis not present

## 2012-11-07 DIAGNOSIS — R41840 Attention and concentration deficit: Secondary | ICD-10-CM | POA: Diagnosis not present

## 2012-11-07 DIAGNOSIS — F0390 Unspecified dementia without behavioral disturbance: Secondary | ICD-10-CM | POA: Diagnosis not present

## 2012-11-07 DIAGNOSIS — R159 Full incontinence of feces: Secondary | ICD-10-CM | POA: Diagnosis not present

## 2012-11-07 DIAGNOSIS — R234 Changes in skin texture: Secondary | ICD-10-CM | POA: Diagnosis not present

## 2012-11-07 DIAGNOSIS — R41841 Cognitive communication deficit: Secondary | ICD-10-CM | POA: Diagnosis not present

## 2012-11-10 DIAGNOSIS — R159 Full incontinence of feces: Secondary | ICD-10-CM | POA: Diagnosis not present

## 2012-11-10 DIAGNOSIS — R41841 Cognitive communication deficit: Secondary | ICD-10-CM | POA: Diagnosis not present

## 2012-11-10 DIAGNOSIS — R41840 Attention and concentration deficit: Secondary | ICD-10-CM | POA: Diagnosis not present

## 2012-11-10 DIAGNOSIS — R234 Changes in skin texture: Secondary | ICD-10-CM | POA: Diagnosis not present

## 2012-11-10 DIAGNOSIS — F0390 Unspecified dementia without behavioral disturbance: Secondary | ICD-10-CM | POA: Diagnosis not present

## 2012-11-12 DIAGNOSIS — R234 Changes in skin texture: Secondary | ICD-10-CM | POA: Diagnosis not present

## 2012-11-12 DIAGNOSIS — R41841 Cognitive communication deficit: Secondary | ICD-10-CM | POA: Diagnosis not present

## 2012-11-12 DIAGNOSIS — F0390 Unspecified dementia without behavioral disturbance: Secondary | ICD-10-CM | POA: Diagnosis not present

## 2012-11-12 DIAGNOSIS — R159 Full incontinence of feces: Secondary | ICD-10-CM | POA: Diagnosis not present

## 2012-11-12 DIAGNOSIS — R41840 Attention and concentration deficit: Secondary | ICD-10-CM | POA: Diagnosis not present

## 2012-11-13 DIAGNOSIS — R159 Full incontinence of feces: Secondary | ICD-10-CM | POA: Diagnosis not present

## 2012-11-13 DIAGNOSIS — R41840 Attention and concentration deficit: Secondary | ICD-10-CM | POA: Diagnosis not present

## 2012-11-13 DIAGNOSIS — F0390 Unspecified dementia without behavioral disturbance: Secondary | ICD-10-CM | POA: Diagnosis not present

## 2012-11-13 DIAGNOSIS — R234 Changes in skin texture: Secondary | ICD-10-CM | POA: Diagnosis not present

## 2012-11-13 DIAGNOSIS — R41841 Cognitive communication deficit: Secondary | ICD-10-CM | POA: Diagnosis not present

## 2012-11-14 DIAGNOSIS — R41840 Attention and concentration deficit: Secondary | ICD-10-CM | POA: Diagnosis not present

## 2012-11-14 DIAGNOSIS — F0390 Unspecified dementia without behavioral disturbance: Secondary | ICD-10-CM | POA: Diagnosis not present

## 2012-11-14 DIAGNOSIS — R159 Full incontinence of feces: Secondary | ICD-10-CM | POA: Diagnosis not present

## 2012-11-14 DIAGNOSIS — R41841 Cognitive communication deficit: Secondary | ICD-10-CM | POA: Diagnosis not present

## 2012-11-14 DIAGNOSIS — R234 Changes in skin texture: Secondary | ICD-10-CM | POA: Diagnosis not present

## 2012-11-20 DIAGNOSIS — R41841 Cognitive communication deficit: Secondary | ICD-10-CM | POA: Diagnosis not present

## 2012-11-20 DIAGNOSIS — F0390 Unspecified dementia without behavioral disturbance: Secondary | ICD-10-CM | POA: Diagnosis not present

## 2012-11-20 DIAGNOSIS — R159 Full incontinence of feces: Secondary | ICD-10-CM | POA: Diagnosis not present

## 2012-11-20 DIAGNOSIS — R41840 Attention and concentration deficit: Secondary | ICD-10-CM | POA: Diagnosis not present

## 2012-11-20 DIAGNOSIS — R234 Changes in skin texture: Secondary | ICD-10-CM | POA: Diagnosis not present

## 2012-12-03 DIAGNOSIS — R159 Full incontinence of feces: Secondary | ICD-10-CM | POA: Diagnosis not present

## 2012-12-03 DIAGNOSIS — R234 Changes in skin texture: Secondary | ICD-10-CM | POA: Diagnosis not present

## 2012-12-03 DIAGNOSIS — R41840 Attention and concentration deficit: Secondary | ICD-10-CM | POA: Diagnosis not present

## 2012-12-04 DIAGNOSIS — R159 Full incontinence of feces: Secondary | ICD-10-CM | POA: Diagnosis not present

## 2012-12-04 DIAGNOSIS — R41840 Attention and concentration deficit: Secondary | ICD-10-CM | POA: Diagnosis not present

## 2012-12-04 DIAGNOSIS — R234 Changes in skin texture: Secondary | ICD-10-CM | POA: Diagnosis not present

## 2012-12-05 DIAGNOSIS — R234 Changes in skin texture: Secondary | ICD-10-CM | POA: Diagnosis not present

## 2012-12-05 DIAGNOSIS — R41840 Attention and concentration deficit: Secondary | ICD-10-CM | POA: Diagnosis not present

## 2012-12-05 DIAGNOSIS — R159 Full incontinence of feces: Secondary | ICD-10-CM | POA: Diagnosis not present

## 2012-12-09 DIAGNOSIS — E785 Hyperlipidemia, unspecified: Secondary | ICD-10-CM | POA: Diagnosis not present

## 2012-12-09 LAB — CBC AND DIFFERENTIAL
HEMATOCRIT: 32 % — AB (ref 36–46)
Hemoglobin: 11 g/dL — AB (ref 12.0–16.0)
PLATELETS: 150 10*3/uL (ref 150–399)
WBC: 3.5 10^3/mL

## 2012-12-09 LAB — LIPID PANEL
Cholesterol: 158 mg/dL (ref 0–200)
HDL: 39 mg/dL (ref 35–70)
LDL CALC: 69 mg/dL
TRIGLYCERIDES: 391 mg/dL — AB (ref 40–160)

## 2012-12-09 LAB — BASIC METABOLIC PANEL
BUN: 21 mg/dL (ref 4–21)
Creatinine: 1.2 mg/dL — AB (ref 0.5–1.1)
GLUCOSE: 70 mg/dL
Potassium: 4.4 mmol/L (ref 3.4–5.3)
Sodium: 139 mmol/L (ref 137–147)

## 2012-12-10 ENCOUNTER — Encounter: Payer: Self-pay | Admitting: Geriatric Medicine

## 2012-12-10 ENCOUNTER — Non-Acute Institutional Stay: Payer: Medicare Other | Admitting: Geriatric Medicine

## 2012-12-10 VITALS — BP 128/62 | HR 73 | Temp 97.0°F | Resp 17 | Ht 64.0 in | Wt 156.0 lb

## 2012-12-10 DIAGNOSIS — E039 Hypothyroidism, unspecified: Secondary | ICD-10-CM

## 2012-12-10 DIAGNOSIS — K602 Anal fissure, unspecified: Secondary | ICD-10-CM | POA: Insufficient documentation

## 2012-12-10 DIAGNOSIS — E785 Hyperlipidemia, unspecified: Secondary | ICD-10-CM | POA: Diagnosis not present

## 2012-12-10 DIAGNOSIS — I10 Essential (primary) hypertension: Secondary | ICD-10-CM

## 2012-12-10 DIAGNOSIS — E538 Deficiency of other specified B group vitamins: Secondary | ICD-10-CM

## 2012-12-10 DIAGNOSIS — F32A Depression, unspecified: Secondary | ICD-10-CM | POA: Insufficient documentation

## 2012-12-10 DIAGNOSIS — F329 Major depressive disorder, single episode, unspecified: Secondary | ICD-10-CM | POA: Insufficient documentation

## 2012-12-10 DIAGNOSIS — L900 Lichen sclerosus et atrophicus: Secondary | ICD-10-CM | POA: Insufficient documentation

## 2012-12-10 DIAGNOSIS — K5900 Constipation, unspecified: Secondary | ICD-10-CM

## 2012-12-10 DIAGNOSIS — K219 Gastro-esophageal reflux disease without esophagitis: Secondary | ICD-10-CM

## 2012-12-10 NOTE — Assessment & Plan Note (Signed)
No c/o gastric reflux, nausea , poor appetite. Continues with Prilosec daily

## 2012-12-10 NOTE — Assessment & Plan Note (Signed)
Pt's symptoms are much better controlled with daily dose of Miralax.

## 2012-12-10 NOTE — Assessment & Plan Note (Signed)
Weekly BP readings stable per AL chart report. Pt continues with metoprolol, recent lab satisfactory

## 2012-12-10 NOTE — Assessment & Plan Note (Signed)
Long standing condition, takes thyroid supplement. Most recent TSH satisfactory. No symptoms of hypothyroidism

## 2012-12-10 NOTE — Assessment & Plan Note (Signed)
Pt. receives B12 supplement monthly. Most recent level improved, recheck level next week

## 2012-12-10 NOTE — Assessment & Plan Note (Signed)
Anal fissure is healed

## 2012-12-14 ENCOUNTER — Encounter: Payer: Self-pay | Admitting: Geriatric Medicine

## 2012-12-14 NOTE — Progress Notes (Signed)
Patient ID: Lorraine [name], female   DOB: 06/07/1927, 79 y.o.   MRN: [id number] Chief Complaint  Patient presents with  . Annual Exam  . Medical Managment of Chronic Issues    blood pressure, depression, constipation, weight loss   HPI  This 79 year old female resident of [facility] presents to clinic today in routine followup of her blood pressure, depression, constipation, anal fissure and recent weight loss. Patient tells me she's been feeling well in general, continues to adjust to the assisted living setting. is participating with some AL activities, continues weekly bridge game. Reports constipation is much better managed with daily MiraLax dosing. Anal fissure is less painful with regular bowel movements. Review of assisted-living records shows that patient's weekly blood pressure readings have been satisfactory, and she has also gained back some of the lost weight. There is no documentation re: mood.  Allergies Reviewed   Medications Reviewed   Data Reviewed      Lab,[facility], external 09/04/2012: CBC: Wbc 3.5, Rbc 3.17, Hgb 10.9, Hct 32.9. MCH 101.9, Plt 134 CMP: Sodium 135, Potassium 3.8, glucose 89, BUN 19, Creatinine 1.27 Lipid: cholesterol 186, triglyceride 436, HDL 44, LDL not calculated due to triglyceride >400 TSH 1.243  Review of Systems   DATA OBTAINED: from patient,  medical record. GENERAL: Feels well. No fevers, fatigue, change in activity status. No change in appetite, weight  SKIN: No itch, rash or open wounds EYES: No eye pain, dryness or itching. No change in vision EARS: No earache, tinnitus, change in hearing NOSE: No congestion, drainage or bleeding MOUTH/THROAT: No mouth or tooth pain. No difficulty chewing or swallowing. RESPIRATORY: No cough, wheezing, SOB CARDIAC: No chest pain, palpitations. No edema. GI: No abdominal pain. No N/V/D or constipation. No heartburn or reflux.  GU: No dysuria, frequency or urgency.  No change in urine volume  or character MUSCULOSKELETAL: No joint pain, swelling or stiffness. No back pain. No muscle ache, pain, weakness. Gait is unsteady. No recent falls.  NEUROLOGIC: No dizziness, fainting, headache. No change in mental status.  PSYCHIATRIC: No anxiety, depression, behavior issue. Sleeps well.   Physical Exam  Filed Vitals:   12/10/12 1518  BP: 128/62  Pulse: 73  Temp: 97 F (36.1 C)  Resp: 17   Filed Weights   12/10/12 1518  Weight: 156 lb (70.761 kg)   Body mass index is 26.76 kg/(m^2).  GENERAL APPEARANCE: No acute distress, appropriately groomed, normal body habitus. Alert, pleasant, conversant. HEAD: Normocephalic, atraumatic EYES: Conjunctiva/lids clear. Pupils round, reactive.  EARS: Hearing grossly normal. NOSE: No deformity or discharge. MOUTH/THROAT: Lips w/o lesions. Oral mucosa, tongue moist, w/o lesion. Oropharynx w/o redness or lesions.  NECK: Supple, full ROM. No thyroid tenderness, enlargement or nodule LYMPHATICS: No head, neck or supraclavicular adenopathy RESPIRATORY: Breathing is even, unlabored. Lung sounds are clear and full.  CARDIOVASCULAR: Heart RRR. No murmur or extra heart sounds   EDEMA: No peripheral edema GASTROINTESTINAL: Abdomen is soft, non-tender, not distended w/ normal bowel sounds.  GENITOURINARY: Bladder non tender, not distended.  FEMALE: No urethral discharge.  Evidence of chronic lichen sclerosis present, skin is intact. No anal fissure evident MUSCULOSKELETAL: Moves all extremities with full ROM, strength and tone, movement is slow /stiff. Gait is unsteady NEUROLOGIC: Oriented to time, place, person. Speech clear, no tremor.  PSYCHIATRIC: Mood and affect appropriate to situation  ASSESSMENT/PLAN  HTN (hypertension) Weekly BP readings stable per AL chart report. Pt continues with metoprolol, recent lab satisfactory  Anal fissure Anal fissure is  healed  B12 deficiency Pt. receives B12 supplement monthly. Most recent level improved,  recheck level next week  GERD (gastroesophageal reflux disease) No c/o gastric reflux, nausea , poor appetite. Continues with Prilosec daily  Unspecified constipation Pt's symptoms are much better controlled with daily dose of Miralax.   Hypothyroid Long standing condition, takes thyroid supplement. Most recent TSH satisfactory. No symptoms of hypothyroidism    Follow up: 3 months  Yohann Curl T.Jeania Nater, NP-C 12/10/2012

## 2012-12-16 DIAGNOSIS — L97509 Non-pressure chronic ulcer of other part of unspecified foot with unspecified severity: Secondary | ICD-10-CM | POA: Diagnosis not present

## 2012-12-16 DIAGNOSIS — D518 Other vitamin B12 deficiency anemias: Secondary | ICD-10-CM | POA: Diagnosis not present

## 2012-12-16 DIAGNOSIS — B351 Tinea unguium: Secondary | ICD-10-CM | POA: Diagnosis not present

## 2012-12-16 DIAGNOSIS — M79609 Pain in unspecified limb: Secondary | ICD-10-CM | POA: Diagnosis not present

## 2013-02-17 DIAGNOSIS — L97509 Non-pressure chronic ulcer of other part of unspecified foot with unspecified severity: Secondary | ICD-10-CM | POA: Diagnosis not present

## 2013-02-17 DIAGNOSIS — B351 Tinea unguium: Secondary | ICD-10-CM | POA: Diagnosis not present

## 2013-02-17 DIAGNOSIS — M79609 Pain in unspecified limb: Secondary | ICD-10-CM | POA: Diagnosis not present

## 2013-03-02 DIAGNOSIS — H04129 Dry eye syndrome of unspecified lacrimal gland: Secondary | ICD-10-CM | POA: Diagnosis not present

## 2013-03-02 DIAGNOSIS — Z961 Presence of intraocular lens: Secondary | ICD-10-CM | POA: Diagnosis not present

## 2013-03-02 DIAGNOSIS — H43819 Vitreous degeneration, unspecified eye: Secondary | ICD-10-CM | POA: Diagnosis not present

## 2013-03-09 ENCOUNTER — Ambulatory Visit: Payer: Medicare Other | Admitting: Internal Medicine

## 2013-03-16 ENCOUNTER — Encounter: Payer: Self-pay | Admitting: Internal Medicine

## 2013-03-16 ENCOUNTER — Non-Acute Institutional Stay: Payer: Medicare Other | Admitting: Internal Medicine

## 2013-03-16 VITALS — BP 110/72 | HR 80 | Ht 64.0 in | Wt 158.0 lb

## 2013-03-16 DIAGNOSIS — I10 Essential (primary) hypertension: Secondary | ICD-10-CM

## 2013-03-16 DIAGNOSIS — E538 Deficiency of other specified B group vitamins: Secondary | ICD-10-CM

## 2013-03-16 DIAGNOSIS — E039 Hypothyroidism, unspecified: Secondary | ICD-10-CM | POA: Diagnosis not present

## 2013-03-16 DIAGNOSIS — E119 Type 2 diabetes mellitus without complications: Secondary | ICD-10-CM | POA: Insufficient documentation

## 2013-03-16 DIAGNOSIS — K219 Gastro-esophageal reflux disease without esophagitis: Secondary | ICD-10-CM

## 2013-03-16 DIAGNOSIS — D649 Anemia, unspecified: Secondary | ICD-10-CM | POA: Diagnosis not present

## 2013-03-16 DIAGNOSIS — E785 Hyperlipidemia, unspecified: Secondary | ICD-10-CM

## 2013-03-16 NOTE — Progress Notes (Signed)
Subjective:    Patient ID: Lorraine [name], female    DOB: [birth year], 79 y.o.   MRN: [id number]  [facility] room [address]  HPI  Having problems with constipation and loose stools.  Type II or unspecified type diabetes mellitus without mention of complication, not stated as uncontrolled: controlled on diet  Anemia: improved  HTN (hypertension): controlled  Hypothyroid: controlled  Hyperlipidemia: high trig, but LDL is controlled  GERD (gastroesophageal reflux disease): asymptomatic  B12 deficiency: gets montly injections   Current Outpatient Prescriptions on File Prior to Visit  Medication Sig Dispense Refill  . acetaminophen (TYLENOL) 325 MG tablet Take 650 mg by mouth. Take two tablets twice daily to reduce back pain      . atorvastatin (LIPITOR) 20 MG tablet 10 mg daily.       . Calcium Carbonate-Vitamin D (CALTRATE 600+D) 600-400 MG-UNIT per tablet Take 1 tablet by mouth daily.      . clobetasol ointment (TEMOVATE) 0.05 % Apply 1 application topically 3 (three) times a week.      . Cyanocobalamin (VITAMIN B-12 IJ) Inject 1,000 mg as directed every 30 (thirty) days.      Marland Kitchen estradiol (CLIMARA - DOSED IN MG/24 HR) 0.05 mg/24hr Place 1 patch onto the skin once a week.      . fluconazole (DIFLUCAN) 100 MG tablet 100 mg daily.       Marland Kitchen levothyroxine (SYNTHROID, LEVOTHROID) 50 MCG tablet Take 50 mcg by mouth daily.      Marland Kitchen loratadine (CLARITIN) 10 MG tablet Take 10 mg by mouth daily.      . metoprolol succinate (TOPROL-XL) 25 MG 24 hr tablet Take 25 mg by mouth daily.      . mirtazapine (REMERON) 15 MG tablet 15 mg. Take one tablet at bedtime      . multivitamin-iron-minerals-folic acid (CENTRUM) chewable tablet Chew 1 tablet by mouth daily.      Marland Kitchen omeprazole (PRILOSEC) 20 MG capsule Take 20 mg by mouth daily.      . polyethylene glycol powder (GLYCOLAX/MIRALAX) powder Take 17 g by mouth daily.            Review of Systems DATA OBTAINED: from patient,  medical record. GENERAL:  Feels well. No fevers, fatigue, change in activity status. No change in appetite, weight   SKIN: No itch, rash or open wounds EYES: No eye pain, dryness or itching. No change in vision EARS: No earache, tinnitus, change in hearing NOSE: No congestion, drainage or bleeding MOUTH/THROAT: No mouth or tooth pain. No difficulty chewing or swallowing. RESPIRATORY: No cough, wheezing, SOB CARDIAC: No chest pain, palpitations. No edema. GI: No abdominal pain. No N/V/D or constipation. No heartburn or reflux.   GU: No dysuria, frequency or urgency.  No change in urine volume or character MUSCULOSKELETAL: No joint pain, swelling or stiffness. No back pain. No muscle ache, pain, weakness. Gait is unsteady. No recent falls.   NEUROLOGIC: No dizziness, fainting, headache. No change in mental status.   PSYCHIATRIC: No anxiety, depression, behavior issue. Sleeps well.      Objective:BP 110/72  Pulse 80  Ht 5\' 4"  (1.626 m)  Wt 158 lb (71.668 kg)  BMI 27.11 kg/m2    Physical Exam GENERAL APPEARANCE: No acute distress, appropriately groomed, normal body habitus. Alert, pleasant, conversant. HEAD: Normocephalic, atraumatic EYES: Conjunctiva/lids clear. Pupils round, reactive. Corrective lenses. EARS: Hearing grossly normal. NOSE: No deformity or discharge. MOUTH/THROAT: Lips w/o lesions. Oral mucosa, tongue moist, w/o lesion. Oropharynx w/o redness or  lesions.   NECK: Supple, full ROM. No thyroid tenderness, enlargement or nodule LYMPHATICS: No head, neck or supraclavicular adenopathy RESPIRATORY: Breathing is even, unlabored. Lung sounds are clear and full.   CARDIOVASCULAR: Heart RRR. No murmur or extra heart sounds                         EDEMA: No peripheral edema GASTROINTESTINAL: Abdomen is soft, non-tender, not distended w/ normal bowel sounds.  MUSCULOSKELETAL: Moves all extremities with full ROM, strength and tone, movement is slow /stiff. Gait is unsteady. Using 4 wheel walker. NEUROLOGIC:  Oriented to time, place, person. Speech clear, no tremor.   PSYCHIATRIC: Mood and affect appropriate to situation    Lab reports 12/09/12 CBC: Hgb 11.0, MCV 99.7  BMP : nl  Lipids: TC 158, trig 391, HDL 39, LDL69 12/16/12 B12: 421     Assessment & Plan:  Type II or unspecified type diabetes mellitus without mention of complication, not stated as uncontrolled: diet controlled  Anemia: improved  HTN (hypertension): stable  Hypothyroid: controlled  Hyperlipidemia: adequate control  GERD (gastroesophageal reflux disease): asymptomatic  B12 deficiency: adequately supplemented  Discontinued Diflucan

## 2013-03-16 NOTE — Patient Instructions (Signed)
Diflucan was stopped. Continue other medications.

## 2013-05-26 DIAGNOSIS — B351 Tinea unguium: Secondary | ICD-10-CM | POA: Diagnosis not present

## 2013-05-26 DIAGNOSIS — L97509 Non-pressure chronic ulcer of other part of unspecified foot with unspecified severity: Secondary | ICD-10-CM | POA: Diagnosis not present

## 2013-05-26 DIAGNOSIS — M79609 Pain in unspecified limb: Secondary | ICD-10-CM | POA: Diagnosis not present

## 2013-06-04 DIAGNOSIS — E785 Hyperlipidemia, unspecified: Secondary | ICD-10-CM | POA: Diagnosis not present

## 2013-06-10 ENCOUNTER — Non-Acute Institutional Stay: Payer: Medicare Other | Admitting: Geriatric Medicine

## 2013-06-10 ENCOUNTER — Encounter: Payer: Self-pay | Admitting: Geriatric Medicine

## 2013-06-10 VITALS — BP 100/62 | HR 84 | Ht 64.0 in | Wt 165.0 lb

## 2013-06-10 DIAGNOSIS — E785 Hyperlipidemia, unspecified: Secondary | ICD-10-CM | POA: Diagnosis not present

## 2013-06-10 DIAGNOSIS — I10 Essential (primary) hypertension: Secondary | ICD-10-CM | POA: Diagnosis not present

## 2013-06-10 DIAGNOSIS — L280 Lichen simplex chronicus: Secondary | ICD-10-CM | POA: Diagnosis not present

## 2013-06-10 NOTE — Progress Notes (Signed)
Patient ID: Lorraine [name], female   DOB: 11-10-1926, 79 y.o.   MRN: [id number] Chief Complaint  Patient presents with  . Medical Managment of Chronic Issues    blood sugar, cholesterol, blood pressure   HPI  This is a 79 y.o. female resident of [facility], Assisted Living  section evaluated today for management of ongoing medical issues.  Review of record shows weekly blood pressure readings are satisfactory, patient's weight has improved, she is eating well. Record shows there is some mild decline in her functional status related to hygiene bathing. Patient Reports she is very happy with the Assisted Living setting. She does continue to require assistance with some ADLs. Patient's only complaint today is some dryness and irritation in her peri-vaginal area. She continues to use a bidet for hygiene related to lichen sclerosis/monilia. But does report that she has not been receiving clobetasol ointment.  Allergies Allergies  Allergen Reactions  . Codeine     Unknown   . Erythromycin     Unknown    Medications Reviewed   Data Reviewed   Laboratory Studies    [facility] 09/04/2012: CBC: Wbc 3.5, Rbc 3.17, Hgb 10.9, Hct 32.9. MCH 101.9, Plt 134 CMP: Sodium 135, Potassium 3.8, glucose 89, BUN 19, Creatinine 1.27 Lipid: cholesterol 186, triglyceride 436, HDL 44, LDL not calculated due to triglyceride >400 TSH 1.243  12/09/2012 WBC 3.5, hemoglobin 11.0, hematocrit 32.1, platelets 150  Glucose 70, BUN 21, creatinine 1.21, sodium 139, potassium 4.4  Total cholesterol 158, triglyceride 291, HDL 39,LDL 69  06/04/2013  Total cholesterol 136, triglyceride 258, HDL 35, LDL 49  Review of Systems   DATA OBTAINED: from patient,  medical record. GENERAL: Feels well. No fevers, fatigue, change in activity status. Increased appetite, weight  SKIN: No itch, rash or open wounds EYES: No eye pain, dryness or itching. No change in vision EARS: No earache, tinnitus, change in  hearing NOSE: No congestion, drainage or bleeding MOUTH/THROAT: No mouth or tooth pain. No difficulty chewing or swallowing. RESPIRATORY: No cough, wheezing, SOB CARDIAC: No chest pain, palpitations. No edema. GI: No abdominal pain. No N/V/D or constipation. No heartburn or reflux.  GU: No dysuria, frequency or urgency.  No change in urine volume or character  Dryness, irritation MUSCULOSKELETAL: No joint pain, swelling or stiffness. No back pain. No muscle ache, pain, weakness. Gait is unsteady. No recent falls.  NEUROLOGIC: No dizziness, fainting, headache. No change in mental status.  PSYCHIATRIC: No anxiety, depression, behavior issue. Sleeps well.   Physical Exam  Filed Vitals:   06/10/13 1356  BP: 100/62  Pulse: 84   Filed Weights   06/10/13 1356  Weight: 165 lb (74.844 kg)   Body mass index is 28.31 kg/(m^2).  GENERAL APPEARANCE: No acute distress, appropriately groomed, normal body habitus. Alert, pleasant, conversant. HEAD: Normocephalic, atraumatic EYES: Conjunctiva/lids clear. Pupils round, reactive.  EARS: Hearing grossly normal. NOSE: No deformity or discharge. MOUTH/THROAT: Lips w/o lesions. Oral mucosa, tongue moist, w/o lesion. Oropharynx w/o redness or lesions.  NECK: Supple, full ROM. No thyroid tenderness, enlargement or nodule LYMPHATICS: No head, neck or supraclavicular adenopathy RESPIRATORY: Breathing is even, unlabored. Lung sounds are clear and full.  CARDIOVASCULAR: Heart RRR. No murmur or extra heart sounds   EDEMA: No peripheral edema GASTROINTESTINAL: Abdomen is soft, non-tender, not distended w/ normal bowel sounds.  GENITOURINARY: Bladder non tender, not distended.  FEMALE: No urethral discharge.  Bilateral labia very irritated and covered with thick white plaques. Inner buttocks are  red, dry, skin is intact MUSCULOSKELETAL: Moves all extremities with full ROM, strength and tone, movement is slow /stiff. Gait is unsteady NEUROLOGIC: Oriented to  time, place, person. Speech clear, no tremor.  PSYCHIATRIC: Mood and affect appropriate to situation  ASSESSMENT/PLAN  HTN (hypertension) Blood pressure well controlled current medications, recent lab studies satisfactory  Other lichen, not elsewhere classified Unable to assess  condition of lichen sclerosis do to significant monilia infection today. PO Diflucan was stopped several months ago, will resume this medication.  Will follow up with nursing staff to be sure they're assisting this patient appropriately with hygiene and topical medication application  Hyperlipidemia Triglycerides much improved since starting atorvastatin, continue this medication, monitor lipid levels at intervals    Follow up: 3 months  Mayleigh Tetrault T.Yakira Duquette, NP-C 06/10/2013

## 2013-06-11 NOTE — Assessment & Plan Note (Signed)
Unable to assess  condition of lichen sclerosis do to significant monilia infection today. PO Diflucan was stopped several months ago, will resume this medication.  Will follow up with nursing staff to be sure they're assisting this patient appropriately with hygiene and topical medication application

## 2013-06-11 NOTE — Assessment & Plan Note (Signed)
Triglycerides much improved since starting atorvastatin, continue this medication, monitor lipid levels at intervals

## 2013-06-11 NOTE — Assessment & Plan Note (Signed)
Blood pressure well controlled current medications, recent lab studies satisfactory

## 2013-06-15 DIAGNOSIS — R279 Unspecified lack of coordination: Secondary | ICD-10-CM | POA: Diagnosis not present

## 2013-06-15 DIAGNOSIS — M6281 Muscle weakness (generalized): Secondary | ICD-10-CM | POA: Diagnosis not present

## 2013-06-15 DIAGNOSIS — Z9181 History of falling: Secondary | ICD-10-CM | POA: Diagnosis not present

## 2013-06-15 LAB — LIPID PANEL
Cholesterol: 136 mg/dL (ref 0–200)
HDL: 35 mg/dL (ref 35–70)
LDL Cholesterol: 49 mg/dL
Triglycerides: 258 mg/dL — AB (ref 40–160)

## 2013-06-16 DIAGNOSIS — M6281 Muscle weakness (generalized): Secondary | ICD-10-CM | POA: Diagnosis not present

## 2013-06-16 DIAGNOSIS — Z9181 History of falling: Secondary | ICD-10-CM | POA: Diagnosis not present

## 2013-06-16 DIAGNOSIS — R279 Unspecified lack of coordination: Secondary | ICD-10-CM | POA: Diagnosis not present

## 2013-06-17 DIAGNOSIS — Z9181 History of falling: Secondary | ICD-10-CM | POA: Diagnosis not present

## 2013-06-17 DIAGNOSIS — Z23 Encounter for immunization: Secondary | ICD-10-CM | POA: Diagnosis not present

## 2013-06-17 DIAGNOSIS — R279 Unspecified lack of coordination: Secondary | ICD-10-CM | POA: Diagnosis not present

## 2013-06-17 DIAGNOSIS — M6281 Muscle weakness (generalized): Secondary | ICD-10-CM | POA: Diagnosis not present

## 2013-06-19 ENCOUNTER — Other Ambulatory Visit: Payer: Self-pay

## 2013-06-19 DIAGNOSIS — Z1231 Encounter for screening mammogram for malignant neoplasm of breast: Secondary | ICD-10-CM

## 2013-06-24 DIAGNOSIS — Z9181 History of falling: Secondary | ICD-10-CM | POA: Diagnosis not present

## 2013-06-24 DIAGNOSIS — R279 Unspecified lack of coordination: Secondary | ICD-10-CM | POA: Diagnosis not present

## 2013-06-24 DIAGNOSIS — M6281 Muscle weakness (generalized): Secondary | ICD-10-CM | POA: Diagnosis not present

## 2013-07-07 ENCOUNTER — Ambulatory Visit
Admission: RE | Admit: 2013-07-07 | Discharge: 2013-07-07 | Disposition: A | Discharge status: Home / Self Care | Payer: Medicare Other | Source: Ambulatory Visit

## 2013-07-07 DIAGNOSIS — Z1231 Encounter for screening mammogram for malignant neoplasm of breast: Secondary | ICD-10-CM | POA: Diagnosis not present

## 2013-07-08 ENCOUNTER — Non-Acute Institutional Stay: Payer: Medicare Other | Admitting: Geriatric Medicine

## 2013-07-08 ENCOUNTER — Encounter: Payer: Self-pay | Admitting: Geriatric Medicine

## 2013-07-08 VITALS — BP 118/62 | HR 72 | Ht 64.0 in | Wt 165.0 lb

## 2013-07-08 DIAGNOSIS — L280 Lichen simplex chronicus: Secondary | ICD-10-CM

## 2013-07-08 NOTE — Progress Notes (Signed)
Patient ID: Lorraine [name], female   DOB: 1927-04-28, 80 y.o.   MRN: [id number]  Code Status:  Contact Information   Name Relation Home Work Wendell Daughter [phone]  [phone]   Blake,Otis Other [phone]       Chief Complaint  Patient presents with  . Rash    in vaginal area for several weeks.   HPI  This is a 79 y.o. female resident of [facility], Assisted Living  section evaluated today in followup of perineal lichen sclerosus.    Last visit: Other lichen, not elsewhere classified Unable to assess  condition of lichen sclerosis do to significant monilia infection today. PO Diflucan was stopped several months ago, will resume this medication.  Will follow up with nursing staff to be sure they're assisting this patient appropriately with hygiene and topical medication application    Allergies Allergies  Allergen Reactions  . Codeine     Unknown   . Erythromycin     Unknown    Medications Reviewed   Data Reviewed   Laboratory Studies    [facility] 09/04/2012: CBC: Wbc 3.5, Rbc 3.17, Hgb 10.9, Hct 32.9. MCH 101.9, Plt 134 CMP: Sodium 135, Potassium 3.8, glucose 89, BUN 19, Creatinine 1.27 Lipid: cholesterol 186, triglyceride 436, HDL 44, LDL not calculated due to triglyceride >400 TSH 1.243  12/09/2012 WBC 3.5, hemoglobin 11.0, hematocrit 32.1, platelets 150  Glucose 70, BUN 21, creatinine 1.21, sodium 139, potassium 4.4  Total cholesterol 158, triglyceride 291, HDL 39,LDL 69  06/04/2013  Total cholesterol 136, triglyceride 258, HDL 35, LDL 49  Review of Systems   DATA OBTAINED: from patient,  medical record. GENERAL: Feels well. No fevers, fatigue, change in activity status. Increased appetite, weight  SKIN: No itch, rash or open wounds EYES: No eye pain, dryness or itching. No change in vision EARS: No earache, tinnitus, change in hearing NOSE: No congestion, drainage or bleeding MOUTH/THROAT:  No mouth or tooth pain. No difficulty chewing or swallowing. RESPIRATORY: No cough, wheezing, SOB CARDIAC: No chest pain, palpitations. No edema. GI: No abdominal pain. No N/V/D or constipation. No heartburn or reflux.  GU: No dysuria, frequency or urgency.  No change in urine volume or character  Dryness, irritation MUSCULOSKELETAL: No joint pain, swelling or stiffness. No back pain. No muscle ache, pain, weakness. Gait is unsteady. No recent falls.  NEUROLOGIC: No dizziness, fainting, headache. No change in mental status.  PSYCHIATRIC: No anxiety, depression, behavior issue. Sleeps well.   Physical Exam  Filed Vitals:   07/08/13 1010  BP: 118/62  Pulse: 72   Filed Weights   07/08/13 1010  Weight: 165 lb (74.844 kg)   Body mass index is 28.31 kg/(m^2).  GENERAL APPEARANCE: No acute distress, appropriately groomed, normal body habitus. Alert, pleasant, conversant. HEAD: Normocephalic, atraumatic EYES: Conjunctiva/lids clear. Pupils round, reactive.  EARS: Hearing grossly normal. NOSE: No deformity or discharge. MOUTH/THROAT: Lips w/o lesions. Oral mucosa, tongue moist, w/o lesion. Oropharynx w/o redness or lesions.  NECK: Supple, full ROM. No thyroid tenderness, enlargement or nodule LYMPHATICS: No head, neck or supraclavicular adenopathy RESPIRATORY: Breathing is even, unlabored. Lung sounds are clear and full.  CARDIOVASCULAR: Heart RRR. No murmur or extra heart sounds   EDEMA: No peripheral edema GASTROINTESTINAL: Abdomen is soft, non-tender, not distended w/ normal bowel sounds.  GENITOURINARY: Bladder non tender, not distended.  FEMALE: No urethral discharge.  Bilateral inner labia/perineal crease with white lesions c/w candida thick white plaques. Groin and buttock  skin is red,intact, not irritated MUSCULOSKELETAL: Moves all extremities with full ROM, strength and tone, movement is slow /stiff. Gait is unsteady NEUROLOGIC: Oriented to time, place, person. Speech clear, no  tremor.  PSYCHIATRIC: Mood and affect appropriate to situation  ASSESSMENT/PLAN  Other lichen, not elsewhere classified No significant improvement with once a week treatment of Diflucan, increased dosing to 150 mg every 72 hours for 3 doses then resume 100mg / week.    Follow up: 3 months  Keenan Dimitrov T.Evon Lopezperez, NP-C 07/08/2013

## 2013-07-10 NOTE — Assessment & Plan Note (Signed)
No significant improvement with once a week treatment of Diflucan, increased dosing to 150 mg every 72 hours for 3 doses then resume 100mg / week.

## 2013-07-18 LAB — HM MAMMOGRAPHY: HM Mammogram: NORMAL

## 2013-08-04 DIAGNOSIS — M79609 Pain in unspecified limb: Secondary | ICD-10-CM | POA: Diagnosis not present

## 2013-08-04 DIAGNOSIS — L97509 Non-pressure chronic ulcer of other part of unspecified foot with unspecified severity: Secondary | ICD-10-CM | POA: Diagnosis not present

## 2013-08-04 DIAGNOSIS — B351 Tinea unguium: Secondary | ICD-10-CM | POA: Diagnosis not present

## 2013-08-17 ENCOUNTER — Encounter: Payer: Self-pay | Admitting: Internal Medicine

## 2013-08-17 ENCOUNTER — Non-Acute Institutional Stay: Payer: Medicare Other | Admitting: Internal Medicine

## 2013-08-17 VITALS — BP 110/60 | HR 80 | Wt 164.0 lb

## 2013-08-17 DIAGNOSIS — B3731 Acute candidiasis of vulva and vagina: Secondary | ICD-10-CM | POA: Insufficient documentation

## 2013-08-17 DIAGNOSIS — I10 Essential (primary) hypertension: Secondary | ICD-10-CM

## 2013-08-17 DIAGNOSIS — B373 Candidiasis of vulva and vagina: Secondary | ICD-10-CM | POA: Insufficient documentation

## 2013-08-17 DIAGNOSIS — L280 Lichen simplex chronicus: Secondary | ICD-10-CM

## 2013-08-17 DIAGNOSIS — R21 Rash and other nonspecific skin eruption: Secondary | ICD-10-CM | POA: Insufficient documentation

## 2013-08-17 MED ORDER — NYSTATIN-TRIAMCINOLONE 100000-0.1 UNIT/GM-% EX CREA: TOPICAL_CREAM | CUTANEOUS | Status: DC

## 2013-08-17 MED ORDER — FLUCONAZOLE 100 MG PO TABS: ORAL_TABLET | ORAL | Status: DC

## 2013-08-17 NOTE — Progress Notes (Signed)
Patient ID: Lorraine [name], female   DOB: 07-04-1927, 79 y.o.   MRN: [id number]    Location:  [facility] [employer] [facility]: Clinic (12)   Code Status: living will  Allergies  Allergen Reactions  . Codeine     Unknown   . Erythromycin     Unknown     Chief Complaint  Patient presents with  . Rash    in perineal, groin, bullock, itchs some times    HPI:  Monilial vaginitis: worse over the last month  Rash and other nonspecific skin eruption: vaginal rash  Other lichen, not elsewhere classified: vaginal. Using steroid cream daily  HTN (hypertension): controlled     Medications: Patient's Medications  New Prescriptions   No medications on file  Previous Medications   ACETAMINOPHEN (TYLENOL) 325 MG TABLET    Take 650 mg by mouth. Take two tablets twice daily to reduce back pain   ASPIRIN 81 MG TABLET    Take 81 mg by mouth daily.   ATORVASTATIN (LIPITOR) 20 MG TABLET    10 mg. Take one tablet daily except none on Monday   CLOBETASOL OINTMENT (TEMOVATE) 0.05 %    Apply 1 application topically 3 (three) times a week.   CYANOCOBALAMIN 1000 MCG TABLET    Take 100 mcg by mouth daily.   ESTRADIOL (CLIMARA - DOSED IN MG/24 HR) 0.05 MG/24HR    Place 1 patch onto the skin once a week.   FLUCONAZOLE (DIFLUCAN) 100 MG TABLET    Take 100 mg by mouth. Take one tablet on Monday   LEVOTHYROXINE (SYNTHROID, LEVOTHROID) 50 MCG TABLET    Take 50 mcg by mouth daily.   LORATADINE (CLARITIN) 10 MG TABLET    Take 10 mg by mouth daily.   METOPROLOL SUCCINATE (TOPROL-XL) 25 MG 24 HR TABLET    Take 25 mg by mouth daily.   MIRTAZAPINE (REMERON) 15 MG TABLET    15 mg. Take one tablet at bedtime   MULTIVITAMIN-IRON-MINERALS-FOLIC ACID (CENTRUM) CHEWABLE TABLET    Chew 1 tablet by mouth daily.   NUTRITIONAL SUPPLEMENTS (ENSURE PO)    Take by mouth. Drink one can daily   OMEPRAZOLE (PRILOSEC) 20 MG CAPSULE    Take 20 mg by mouth daily.   POLYETHYLENE GLYCOL POWDER  (GLYCOLAX/MIRALAX) POWDER    Take 17 g by mouth daily.  Modified Medications   No medications on file  Discontinued Medications   No medications on file     Review of Systems  Constitutional: Negative for chills, weight loss and diaphoresis.  HENT: Positive for hearing loss.   Eyes: Negative for blurred vision, double vision, photophobia and pain.  Respiratory: Negative for cough, hemoptysis, sputum production, shortness of breath and wheezing.   Cardiovascular: Negative for chest pain, palpitations, orthopnea, claudication, leg swelling and PND.  Gastrointestinal: Negative for heartburn, nausea, vomiting, abdominal pain, constipation, blood in stool and melena.  Genitourinary: Negative for dysuria, urgency and frequency.  Musculoskeletal: Negative for back pain, joint pain, myalgias and neck pain.  Skin: Positive for rash (vaginal).  Neurological: Negative.  Negative for weakness.  Endo/Heme/Allergies: Negative.      Filed Vitals:   08/17/13 1435  BP: 110/60  Pulse: 80  Weight: 164 lb (74.39 kg)   Physical Exam  Constitutional: She is oriented to person, place, and time. She appears well-nourished.  HENT:  Right Ear: External ear normal.  Left Ear: External ear normal.  Nose: Nose normal.  Mouth/Throat: Oropharynx is clear  and moist. No oropharyngeal exudate.  Eyes: Conjunctivae and EOM are normal. Pupils are equal, round, and reactive to light.  Corrective lenses  Neck: No JVD present. No tracheal deviation present. No thyromegaly present.  Cardiovascular: Normal rate, regular rhythm, normal heart sounds and intact distal pulses.  Exam reveals no gallop and no friction rub.   No murmur heard. Pulmonary/Chest: No respiratory distress. She has no wheezes. She has no rales. She exhibits no tenderness.  Abdominal: She exhibits no distension and no mass. There is no tenderness. No hernia.  Genitourinary: Vaginal discharge found.  Vaginal moniliasis  Musculoskeletal: She  exhibits no edema and no tenderness.  Neurological: She is oriented to person, place, and time. She displays normal reflexes. No cranial nerve deficit. She exhibits normal muscle tone. Coordination normal.  Skin: Rash noted. There is erythema.  Vaginal moniliasis  Psychiatric: She has a normal mood and affect. Judgment and thought content normal.         Assessment/Plan 1. Rash and other nonspecific skin eruption  2. Other lichen, not elsewhere classified Chronic itching that was improved with steroids  3. HTN (hypertension) controlled  4. Monilial vaginitis - nystatin-triamcinolone (MYCOLOG II) cream; Apply daily to the rash in the vaginal area including the labia  Dispense: 30 g; Refill: 0 - fluconazole (DIFLUCAN) 100 MG tablet; Take one tablet daily for 7 days, then resume one tablet weekly  Dispense: 12 tablet; Refill: 5

## 2013-08-20 ENCOUNTER — Encounter: Payer: Self-pay | Admitting: Geriatric Medicine

## 2013-08-20 DIAGNOSIS — N390 Urinary tract infection, site not specified: Secondary | ICD-10-CM | POA: Diagnosis not present

## 2013-08-20 NOTE — Progress Notes (Signed)
This encounter was created in error - please disregard.

## 2013-09-07 ENCOUNTER — Encounter: Payer: Self-pay | Admitting: Internal Medicine

## 2013-09-07 ENCOUNTER — Non-Acute Institutional Stay: Payer: Medicare Other | Admitting: Internal Medicine

## 2013-09-07 VITALS — BP 130/68 | HR 80 | Wt 168.0 lb

## 2013-09-07 DIAGNOSIS — B373 Candidiasis of vulva and vagina: Secondary | ICD-10-CM | POA: Diagnosis not present

## 2013-09-07 DIAGNOSIS — L280 Lichen simplex chronicus: Secondary | ICD-10-CM | POA: Diagnosis not present

## 2013-09-07 DIAGNOSIS — R21 Rash and other nonspecific skin eruption: Secondary | ICD-10-CM | POA: Diagnosis not present

## 2013-09-07 DIAGNOSIS — B3731 Acute candidiasis of vulva and vagina: Secondary | ICD-10-CM

## 2013-09-07 DIAGNOSIS — I10 Essential (primary) hypertension: Secondary | ICD-10-CM

## 2013-09-14 ENCOUNTER — Encounter: Payer: Self-pay | Admitting: Internal Medicine

## 2013-09-23 ENCOUNTER — Encounter: Payer: Self-pay | Admitting: Geriatric Medicine

## 2013-09-23 ENCOUNTER — Non-Acute Institutional Stay: Payer: Medicare Other | Admitting: Geriatric Medicine

## 2013-09-23 VITALS — BP 104/60 | HR 100 | Temp 95.5°F | Ht 64.0 in | Wt 168.0 lb

## 2013-09-23 DIAGNOSIS — L280 Lichen simplex chronicus: Secondary | ICD-10-CM | POA: Diagnosis not present

## 2013-09-23 DIAGNOSIS — R21 Rash and other nonspecific skin eruption: Secondary | ICD-10-CM

## 2013-09-23 NOTE — Assessment & Plan Note (Addendum)
No topical treatment at this time, continue weekly Diflucan. May require return to [place]. for reevaluation

## 2013-09-23 NOTE — Progress Notes (Signed)
Patient ID: Lorraine [name], female   DOB: [birth year], 80 y.o.   MRN: [id number] [facility]  Code Status:DNR  Contact Information   Name Relation Home Work [place] Daughter [phone]  [phone]   Blake,Otis Other [phone]       Chief Complaint  Patient presents with  . Vaginal Itching    reddness, and going down legs   HPI  This is a 80 y.o. female resident of [facility]  section evaluated today in followup of perineal lichen sclerosus.    Recent  visits: 34/09/9377 Other lichen, not elsewhere classified No significant improvement with once a week treatment of Diflucan, increased dosing to 150 mg every 72 hours for 3 doses then resume 100mg / week.  08/17/2013, Dr.Green:  2. Other lichen, not elsewhere classified Chronic itching that was improved with steroids  3. HTN (hypertension) controlled  4. Monilial vaginitis - nystatin-triamcinolone (MYCOLOG II) cream; Apply daily to the rash in the vaginal area including the labia  Dispense: 30 g; Refill: 0 - fluconazole (DIFLUCAN) 100 MG tablet; Take one tablet daily for 7 days, then resume one tablet weekly  Dispense: 12 tablet; Refill: 5  Since last visit, Mycolog-II cream was discontinued and Monistat cream was started for one week. This treatment ended on January 13. Nursing staff reports that there's been no treatment to the vagina or surrounding skin since January 13. 2 days ago patient developed a bright red, tender rash on her upper inner thighs.  Allergies  Allergen Reactions  . Codeine     Unknown   . Erythromycin     Unknown    MEDICATION- Reviewed   DATA REVIEWED Laboratory Studies    [facility] 09/04/2012: CBC: Wbc 3.5, Rbc 3.17, Hgb 10.9, Hct 32.9. MCH 101.9, Plt 134 CMP: Sodium 135, Potassium 3.8, glucose 89, BUN 19, Creatinine 1.27 Lipid: cholesterol 186, triglyceride 436, HDL 44, LDL not calculated due to  triglyceride >400 TSH 1.243  12/09/2012 WBC 3.5, hemoglobin 11.0, hematocrit 32.1, platelets 150  Glucose 70, BUN 21, creatinine 1.21, sodium 139, potassium 4.4  Total cholesterol 158, triglyceride 291, HDL 39,LDL 69  06/04/2013  Total cholesterol 136, triglyceride 258, HDL 35, LDL 49  REVIEW OF SYSTEMS  DATA OBTAINED: from patient,  medical record. GENERAL: Feels well. No fevers, fatigue, change in activity status. Increased appetite, weight  SKIN: See history of present illness   rash on her lower legs as well RESPIRATORY: No cough, wheezing, SOB CARDIAC: No chest pain No edema. GI: No abdominal pain. No N/V/D or constipation. No heartburn or reflux.  GU: No dysuria, frequency or urgency.  No change in urine volume or character  Dryness, irritation. Continues to use Bidet for hygiene MUSCULOSKELETAL:  Gait is unsteady. No recent falls.  NEUROLOGIC: No dizziness, fainting, headache. No change in mental status.  PSYCHIATRIC: No anxiety, depression, behavior issue. Sleeps well.   PHYSICAL EXAM  Filed Vitals:   09/23/13 0904  BP: 104/60  Pulse: 100  Temp: 95.5 F (35.3 C)   Filed Weights   09/23/13 0904  Weight: 168 lb (76.204 kg)   Body mass index is 28.82 kg/(m^2).  GENERAL APPEARANCE: No acute distress, appropriately groomed, normal body habitus. Alert, pleasant, conversant. HEAD: Normocephalic, atraumatic EYES: Conjunctiva/lids clear. Pupils round, reactive.  EARS: Hearing grossly normal. RESPIRATORY: Breathing is even, unlabored.   EDEMA: No peripheral edema GENITOURINARY: Bladder non tender, not distended.  FEMALE: No urethral discharge.  Bilateral inner/outer labia with white  coating. Groin creases are clear. Inner upper thighs with a bright red, dry, tender confluent rash. MUSCULOSKELETAL: Moves all extremities with full ROM, strength and tone, movement is slow /stiff. Gait is unsteady. Requiressignificant assistance to get on exam table  NEUROLOGIC: Oriented to time,  place, person. Speech clear, no tremor.  PSYCHIATRIC: Mood and affect appropriate to situation  ASSESSMENT/PLAN  Other lichen, not elsewhere classified No topical treatment at this time, continue weekly Diflucan. May require return to [place]. for reevaluation  Rash and other nonspecific skin eruption Rash on inner upper thighs appears to be dermatitis. Will use clobetasol twice a day for the next couple of days. I will reassess on January 23 for effectiveness.    Follow up:  09/25/2013  Rawleigh Rode T.Caydence Enck, NP-C 09/23/2013

## 2013-09-23 NOTE — Assessment & Plan Note (Signed)
Rash on inner upper thighs appears to be dermatitis. Will use clobetasol twice a day for the next couple of days. I will reassess on January 23 for effectiveness.

## 2013-09-25 ENCOUNTER — Encounter: Payer: Self-pay | Admitting: Geriatric Medicine

## 2013-09-25 ENCOUNTER — Non-Acute Institutional Stay: Payer: Medicare Other | Admitting: Geriatric Medicine

## 2013-09-25 DIAGNOSIS — R21 Rash and other nonspecific skin eruption: Secondary | ICD-10-CM

## 2013-09-25 NOTE — Assessment & Plan Note (Addendum)
Bilateral thigh rash is improved with use of clobetasol, continued for next few days. Labial skin eruption continues with features of both lichen sclerosus and candida infection. She has been treated with topical antifungal agents as well as p.o. Diflucan. Would benefit from formal GYN exam with ability for closer examination microscopy and/or culture. In the meantime will try to improve his situation with higher dose of Diflucan.

## 2013-09-25 NOTE — Progress Notes (Signed)
Patient ID: Lorraine [name], female   DOB: Jun 15, 1927, 80 y.o.   MRN: [id number]  [place]  Code Status:DNR  Contact Information   Name Relation Home Work [place] Daughter [phone]  [phone]   Blake,Otis Other [phone]       Chief Complaint  Patient presents with  . Vaginal/ perineal rash   HPI  This is a 80 y.o. female resident of [facility]  section evaluated today in followup of perineal lichen sclerosus/thigh dermatitis.    Last Visit: Other lichen, not elsewhere classified No topical treatment at this time, continue weekly Diflucan. May require return to [place]. for reevaluation  Rash and other nonspecific skin eruption Rash on inner upper thighs appears to be dermatitis. Will use clobetasol twice a day for the next couple of days. I will reassess on January 23 for effectiveness.  Since last visit patient reports less pain and general discomfort in the perineal area. Nurse has been applying clobetasol as directed.  Allergies  Allergen Reactions  . Codeine     Unknown   . Erythromycin     Unknown    MEDICATION- Reviewed   DATA REVIEWED Laboratory Studies    [facility] 09/04/2012: CBC: Wbc 3.5, Rbc 3.17, Hgb 10.9, Hct 32.9. MCH 101.9, Plt 134 CMP: Sodium 135, Potassium 3.8, glucose 89, BUN 19, Creatinine 1.27 Lipid: cholesterol 186, triglyceride 436, HDL 44, LDL not calculated due to triglyceride >400 TSH 1.243  12/09/2012 WBC 3.5, hemoglobin 11.0, hematocrit 32.1, platelets 150  Glucose 70, BUN 21, creatinine 1.21, sodium 139, potassium 4.4  Total cholesterol 158, triglyceride 291, HDL 39,LDL 69  06/04/2013  Total cholesterol 136, triglyceride 258, HDL 35, LDL 49  08/21/2013 urine culture: No growth  REVIEW OF SYSTEMS  DATA OBTAINED: from patient,  medical record. GENERAL: Feels well. No fevers, fatigue, change in activity status.  Increased appetite, weight  SKIN: See history of present illness    RESPIRATORY: No cough, wheezing, SOB CARDIAC: No chest pain No edema. GI: No abdominal pain. No N/V/D or constipation. No heartburn or reflux.  GU: No dysuria, frequency or urgency.  No change in urine volume or character  Dryness, irritation. Continues to use Bidet for hygiene MUSCULOSKELETAL:  Gait is unsteady. No recent falls.  NEUROLOGIC: No dizziness, fainting, headache. No change in mental status.  PSYCHIATRIC: No anxiety, depression, behavior issue. Sleeps well.   PHYSICAL EXAM  Filed Vitals:   09/25/13 1913  BP: 116/72  Pulse: 87  Temp: 98 F (36.7 C)  Resp: 20   Filed Weights   09/25/13 1913  Weight: 168 lb (76.204 kg)   Body mass index is 28.82 kg/(m^2).  GENERAL APPEARANCE: No acute distress, appropriately groomed, normal body habitus. Alert, pleasant, conversant. RESPIRATORY: Breathing is even, unlabored.   EDEMA: No peripheral edema GENITOURINARY: Bladder non tender, not distended.  FEMALE: No urethral discharge.  Bilateral inner/outer labia inflamed w/ white coating, inner labia with multiple round, white lesions. Perineal area is macerated. Groin creases are clear. Inner upper thigh rash is less red/tender/irritated.   MUSCULOSKELETAL: Moves all extremities with full ROM, strength and tone, movement is slow /stiff. Gait is unsteady. Requiressignificant assistance to get on exam table  NEUROLOGIC: Oriented to time, place, person. Speech clear, no tremor.  PSYCHIATRIC: Mood and affect appropriate to situation  ASSESSMENT/PLAN  Rash and other nonspecific skin eruption Bilateral thigh rash is improved with use of clobetasol, continued for next few days. Labial  skin eruption continues with features of both lichen sclerosus and candida infection. She has been treated with topical antifungal agents as well as p.o. Diflucan. Would benefit from formal GYN exam with ability for closer examination microscopy  and/or culture. In the meantime will try to improve his situation with higher dose of Diflucan.   Follow up:  As needed  Amaria Mundorf T.Jonathin Heinicke, NP-C 09/25/2013

## 2013-10-06 DIAGNOSIS — B351 Tinea unguium: Secondary | ICD-10-CM | POA: Diagnosis not present

## 2013-10-06 DIAGNOSIS — L97509 Non-pressure chronic ulcer of other part of unspecified foot with unspecified severity: Secondary | ICD-10-CM | POA: Diagnosis not present

## 2013-10-06 DIAGNOSIS — M79609 Pain in unspecified limb: Secondary | ICD-10-CM | POA: Diagnosis not present

## 2013-10-09 DIAGNOSIS — N760 Acute vaginitis: Secondary | ICD-10-CM | POA: Diagnosis not present

## 2013-10-09 DIAGNOSIS — L989 Disorder of the skin and subcutaneous tissue, unspecified: Secondary | ICD-10-CM | POA: Diagnosis not present

## 2013-10-26 ENCOUNTER — Non-Acute Institutional Stay: Payer: Medicare Other | Admitting: Geriatric Medicine

## 2013-10-26 DIAGNOSIS — L280 Lichen simplex chronicus: Secondary | ICD-10-CM

## 2013-10-26 NOTE — Assessment & Plan Note (Signed)
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 Discussed with Sherolyn Buba, NP over at gynecology. She recommends evaluation by dermatology. [name] will arrange an appointment with Dr. Rolm Bookbinder.

## 2013-10-26 NOTE — Progress Notes (Signed)
Patient ID: Lorraine [name], female   DOB: 13-Aug-1927, 80 y.o.   MRN: [id number]  [place]  Code Status:DNR  Contact Information   Name Relation Home Work [place] Daughter [phone]  [phone]   [name],Lorraine Other [phone]       Chief Complaint  Patient presents with  . Lichen sclerosis   HPI  This is a 80 y.o. female resident of [facility]  section evaluated today in followup of perineal lichen sclerosus/thigh dermatitis.    Last Visit: Rash and other nonspecific skin eruption Bilateral thigh rash is improved with use of clobetasol, continued for next few days. Labial skin eruption continues with features of both lichen sclerosus and candida infection. She has been treated with topical antifungal agents as well as p.o. Diflucan. Would benefit from formal GYN exam with ability for closer examination microscopy and/or culture. In the meantime will try to improve his situation with higher dose of Diflucan.  Since last visit, patient was evaluated in gynecology. Clobetasol and Zeasorb powder were added. Diflucan dose was changed. Patient reports perineum felt better until the ointment was completed last week. Today she reports significant discomfort.   Allergies  Allergen Reactions  . Codeine     Unknown   . Erythromycin     Unknown    MEDICATION- Reviewed   DATA REVIEWED Laboratory Studies    [facility] 09/04/2012: CBC: Wbc 3.5, Rbc 3.17, Hgb 10.9, Hct 32.9. MCH 101.9, Plt 134 CMP: Sodium 135, Potassium 3.8, glucose 89, BUN 19, Creatinine 1.27 Lipid: cholesterol 186, triglyceride 436, HDL 44, LDL not calculated due to triglyceride >400 TSH 1.243  12/09/2012 WBC 3.5, hemoglobin 11.0, hematocrit 32.1, platelets 150  Glucose 70, BUN 21, creatinine 1.21, sodium 139, potassium 4.4  Total cholesterol 158, triglyceride 291, HDL 39,LDL 69  06/04/2013  Total  cholesterol 136, triglyceride 258, HDL 35, LDL 49  08/21/2013 urine culture: No growth   REVIEW OF SYSTEMS  DATA OBTAINED: from patient,  medical record. GENERAL: Feels well. No fevers, fatigue, change in activity status. Increased appetite, weight  SKIN: See history of present illness    RESPIRATORY: No cough, wheezing, SOB CARDIAC: No chest pain No edema. GI: No abdominal pain. No N/V/D or constipation. No heartburn or reflux.  GU: No dysuria, frequency or urgency.  No change in urine volume or character  Dryness, irritation. Continues to use Bidet for hygiene MUSCULOSKELETAL:  Gait is unsteady. No recent falls.  NEUROLOGIC: No dizziness, fainting, headache. No change in mental status.  PSYCHIATRIC: No anxiety, depression, behavior issue. Sleeps well.   PHYSICAL EXAM  There were no vitals filed for this visit. There were no vitals filed for this visit. There is no weight on file to calculate BMI.  GENERAL APPEARANCE: No acute distress, appropriately groomed, normal body habitus. Alert, pleasant, conversant. RESPIRATORY: Breathing is even, unlabored.   EDEMA: No peripheral edema GENITOURINARY: Bladder non tender, not distended.  FEMALE: No urethral discharge.  Bilateral inner labia inflamed w/ multiple, white round lesions. Perineal area is covered with yellow tinted scales, skin is very inflamed. Groin creases are clear. Inner upper thigh rash is resolve MUSCULOSKELETAL: Moves all extremities with full ROM, strength and tone, movement is slow /stiff. Gait is unsteady.  NEUROLOGIC: Oriented to time, place, person. Speech clear, no tremor.  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 Discussed with Lorraine Buba, NP over at gynecology. She recommends evaluation by  dermatology. [name] will arrange an appointment with Dr. Rolm [name].   Follow up:  As scheduled in [place] [facility] or as needed  Lorraine Celeste, NP-C [facility] [phone]  10/26/2013

## 2013-10-28 ENCOUNTER — Encounter: Payer: Medicare Other | Admitting: Geriatric Medicine

## 2013-11-02 ENCOUNTER — Encounter: Payer: Self-pay | Admitting: Internal Medicine

## 2013-11-02 ENCOUNTER — Non-Acute Institutional Stay: Payer: Medicare Other | Admitting: Internal Medicine

## 2013-11-02 VITALS — BP 130/66 | HR 76 | Wt 165.0 lb

## 2013-11-02 DIAGNOSIS — I10 Essential (primary) hypertension: Secondary | ICD-10-CM

## 2013-11-02 DIAGNOSIS — E785 Hyperlipidemia, unspecified: Secondary | ICD-10-CM

## 2013-11-02 DIAGNOSIS — F3289 Other specified depressive episodes: Secondary | ICD-10-CM | POA: Diagnosis not present

## 2013-11-02 DIAGNOSIS — R21 Rash and other nonspecific skin eruption: Secondary | ICD-10-CM

## 2013-11-02 DIAGNOSIS — F329 Major depressive disorder, single episode, unspecified: Secondary | ICD-10-CM | POA: Diagnosis not present

## 2013-11-02 DIAGNOSIS — E119 Type 2 diabetes mellitus without complications: Secondary | ICD-10-CM | POA: Diagnosis not present

## 2013-11-02 NOTE — Progress Notes (Signed)
Patient ID: Lorraine [name], female   DOB: July 30, 1927, 80 y.o.   MRN: [id number]    Location:  [facility] (12)    Allergies  Allergen Reactions  . Codeine     Unknown   . Erythromycin     Unknown     Chief Complaint  Patient presents with  . Rash    on labia, itching.not getting any better    HPI:  Labial rash has improved only a little. She was doing well for a while after her last visit, but then got worse. Continues with itching.  Medications: Patient's Medications  New Prescriptions   No medications on file  Previous Medications   ACETAMINOPHEN (TYLENOL) 325 MG TABLET    Take 650 mg by mouth. Take two tablets twice daily to reduce back pain   ASPIRIN 81 MG TABLET    Take 81 mg by mouth daily.   ATORVASTATIN (LIPITOR) 20 MG TABLET    10 mg. Take one tablet daily except none on Monday   CLOBETASOL OINTMENT (TEMOVATE) 0.05 %    Apply 1 application topically 2 (two) times daily. Apply to affected areas of labia and perineum BID for 2 weeks , then 3 times weekly   CYANOCOBALAMIN 1000 MCG TABLET    Take 100 mcg by mouth daily.   ESTRADIOL (CLIMARA - DOSED IN MG/24 HR) 0.05 MG/24HR    Place 1 patch onto the skin once a week.   FLUCONAZOLE (DIFLUCAN) 150 MG TABLET    Take 150 mg by mouth once a week.   LEVOTHYROXINE (SYNTHROID, LEVOTHROID) 50 MCG TABLET    Take 50 mcg by mouth daily.   LORATADINE (CLARITIN) 10 MG TABLET    Take 10 mg by mouth daily.   METOPROLOL SUCCINATE (TOPROL-XL) 25 MG 24 HR TABLET    Take 25 mg by mouth daily.   METOPROLOL TARTRATE (LOPRESSOR) 25 MG TABLET       MICONAZOLE (ZEASORB-AF) 2 % POWDER    Apply 1 application topically 2 (two) times daily.   MIRTAZAPINE (REMERON) 15 MG TABLET    15 mg. Take one tablet at bedtime   MULTIVITAMIN-IRON-MINERALS-FOLIC ACID (CENTRUM) CHEWABLE TABLET    Chew 1 tablet by mouth daily.   NUTRITIONAL SUPPLEMENTS (ENSURE PO)    Take by mouth. Drink one can daily   NYSTATIN-TRIAMCINOLONE (MYCOLOG II) CREAM       OMEPRAZOLE (PRILOSEC) 20 MG CAPSULE    Take 20 mg by mouth daily.   POLYETHYLENE GLYCOL POWDER (GLYCOLAX/MIRALAX) POWDER    Take 17 g by mouth daily.  Modified Medications   No medications on file  Discontinued Medications   FLUCONAZOLE (DIFLUCAN) 100 MG TABLET    Take one tablet daily for 7 days, then resume one tablet weekly   FLUCONAZOLE (DIFLUCAN) 100 MG TABLET    Take  one tablet weekly   NYSTATIN-TRIAMCINOLONE (MYCOLOG II) CREAM    Apply daily to the rash in the vaginal area including the labia     Review of Systems  Constitutional: Positive for activity change. Negative for fever, diaphoresis, appetite change, fatigue and unexpected weight change.  HENT: Positive for congestion and hearing loss. Negative for sinus pressure and sore throat.   Eyes: Positive for visual disturbance (corrective lenses).  Respiratory: Negative for cough, wheezing and stridor.   Cardiovascular: Negative for chest pain, palpitations and leg swelling.  Gastrointestinal: Positive for constipation. Negative for nausea, abdominal pain, diarrhea, blood in stool, abdominal distention and  rectal pain.  Endocrine: Negative.   Genitourinary: Negative.   Musculoskeletal: Positive for gait problem. Negative for arthralgias, back pain, joint swelling, myalgias and neck pain.  Skin: Positive for rash (vaginal moniliasis and atrophic and steroid skin changes). Negative for pallor and wound.  Allergic/Immunologic: Negative.   Neurological: Negative.  Negative for dizziness.  Hematological: Negative.   Psychiatric/Behavioral: Negative.     Filed Vitals:   09/07/13 1630  BP: 130/68  Pulse: 80  Weight: 168 lb (76.204 kg)   Physical Exam  Constitutional: She is oriented to person, place, and time. She appears well-developed and well-nourished. No distress.  overweight  HENT:  Head: Normocephalic and atraumatic.  Right Ear: External ear normal.  Left Ear: External  ear normal.  Nose: Nose normal.  Eyes: Conjunctivae and EOM are normal. Pupils are equal, round, and reactive to light. Left eye exhibits no discharge.  Neck: No JVD present. No tracheal deviation present. No thyromegaly present.  Abdominal: She exhibits no distension and no mass. There is no tenderness.  Genitourinary:  Vaginal moniliasis, atrophy, and steroid induced skin changes  Musculoskeletal: Normal range of motion. She exhibits no edema and no tenderness.  Neurological: She is alert and oriented to person, place, and time. She has normal reflexes. No cranial nerve deficit. Coordination normal.  Skin: No rash noted. No erythema. No pallor.  Psychiatric: She has a normal mood and affect. Her behavior is normal. Judgment and thought content normal.     Labs reviewed: No visits with results within 3 Month(s) from this visit. Latest known visit with results is:  Admission on 07/28/2012, Discharged on 07/28/2012  Component Date Value Ref Range Status  . WBC 07/28/2012 4.9  4.0 - 10.5 K/uL Final  . RBC 07/28/2012 3.16* 3.87 - 5.11 MIL/uL Final  . Hemoglobin 07/28/2012 10.9* 12.0 - 15.0 g/dL Final  . HCT 07/28/2012 31.5* 36.0 - 46.0 % Final  . MCV 07/28/2012 99.7  78.0 - 100.0 fL Final  . MCH 07/28/2012 34.5* 26.0 - 34.0 pg Final  . MCHC 07/28/2012 34.6  30.0 - 36.0 g/dL Final  . RDW 07/28/2012 13.7  11.5 - 15.5 % Final  . Platelets 07/28/2012 134* 150 - 400 K/uL Final  . Sodium 07/28/2012 138  135 - 145 mEq/L Final  . Potassium 07/28/2012 3.7  3.5 - 5.1 mEq/L Final  . Chloride 07/28/2012 107  96 - 112 mEq/L Final  . CO2 07/28/2012 22  19 - 32 mEq/L Final  . Glucose, Bld 07/28/2012 107* 70 - 99 mg/dL Final  . BUN 07/28/2012 29* 6 - 23 mg/dL Final  . Creatinine, Ser 07/28/2012 1.49* 0.50 - 1.10 mg/dL Final  . Calcium 07/28/2012 11.3* 8.4 - 10.5 mg/dL Final  . GFR calc non Af Amer 07/28/2012 31* >90 mL/min Final  . GFR calc Af Amer 07/28/2012 36* >90 mL/min Final   Comment:                                  The eGFR has been calculated                          using the CKD EPI equation.                          This calculation has not been  validated in all clinical                          situations.                          eGFR's persistently                          <90 mL/min signify                          possible Chronic Kidney Disease.  Marland Kitchen Prothrombin Time 07/28/2012 12.7  11.6 - 15.2 seconds Final  . INR 07/28/2012 0.96  0.00 - 1.49 Final  . Color, Urine 07/28/2012 YELLOW  YELLOW Final  . APPearance 07/28/2012 CLEAR  CLEAR Final  . Specific Gravity, Urine 07/28/2012 1.031* 1.005 - 1.030 Final  . pH 07/28/2012 5.5  5.0 - 8.0 Final  . Glucose, UA 07/28/2012 NEGATIVE  NEGATIVE mg/dL Final  . Hgb urine dipstick 07/28/2012 NEGATIVE  NEGATIVE Final  . Bilirubin Urine 07/28/2012 SMALL* NEGATIVE Final  . Ketones, ur 07/28/2012 NEGATIVE  NEGATIVE mg/dL Final  . Protein, ur 07/28/2012 30* NEGATIVE mg/dL Final  . Urobilinogen, UA 07/28/2012 1.0  0.0 - 1.0 mg/dL Final  . Nitrite 07/28/2012 NEGATIVE  NEGATIVE Final  . Leukocytes, UA 07/28/2012 NEGATIVE  NEGATIVE Final  . Squamous Epithelial / LPF 07/28/2012 FEW* RARE Final  . Bacteria, UA 07/28/2012 FEW* RARE Final  . Casts 07/28/2012 HYALINE CASTS* NEGATIVE Final  . Urine-Other 07/28/2012 MUCOUS PRESENT   Final      Assessment/Plan  Rash and other nonspecific skin eruption: Continue current medications. Avoid steroid creams.  Other lichen, not elsewhere classified: Continue current medications. Avoid steroid creams.  Monilial vaginitis:Continue current medications. Avoid steroid creams.  HTN (hypertension): controlled

## 2013-11-02 NOTE — Progress Notes (Signed)
Patient ID: Lorraine [name], female   DOB: August 24, 1927, 80 y.o.   MRN: [id number]    Location:  [facility] (12)    Allergies  Allergen Reactions  . Codeine     Unknown   . Erythromycin     Unknown     Chief Complaint  Patient presents with  . Medical Managment of Chronic Issues    blood pressure, blood sugar, thyroid, depression    HPI:  Rash and other nonspecific skin eruption: Mild improvement in vaginal rash noted. Significantly improved itching. Has been using Zeasorb and clobetasol and oral Diflucan. Steroid changes in skin are still prominent  Hyperlipidemia: controlled  HTN (hypertension): controlled    Medications: Patient's Medications  New Prescriptions   No medications on file  Previous Medications   ACETAMINOPHEN (TYLENOL) 325 MG TABLET    Take 650 mg by mouth. Take two tablets twice daily to reduce back pain   ASPIRIN 81 MG TABLET    Take 81 mg by mouth daily.   ATORVASTATIN (LIPITOR) 20 MG TABLET    10 mg. Take one tablet daily except none on Monday   CLOBETASOL OINTMENT (TEMOVATE) 0.05 %    Apply 1 application topically 2 (two) times daily. Apply to affected areas of labia and perineum BID for 2 weeks , then 3 times weekly   CYANOCOBALAMIN 1000 MCG TABLET    Take 100 mcg by mouth daily.   ESTRADIOL (CLIMARA - DOSED IN MG/24 HR) 0.05 MG/24HR    Place 1 patch onto the skin once a week.   FLUCONAZOLE (DIFLUCAN) 150 MG TABLET    Take 150 mg by mouth once a week.   LEVOTHYROXINE (SYNTHROID, LEVOTHROID) 50 MCG TABLET    Take 50 mcg by mouth daily.   LORATADINE (CLARITIN) 10 MG TABLET    Take 10 mg by mouth daily.   METOPROLOL SUCCINATE (TOPROL-XL) 25 MG 24 HR TABLET    Take 25 mg by mouth daily.   METOPROLOL TARTRATE (LOPRESSOR) 25 MG TABLET       MICONAZOLE (ZEASORB-AF) 2 % POWDER    Apply 1 application topically 2 (two) times daily.   MIRTAZAPINE (REMERON) 15 MG TABLET    15 mg. Take one tablet at bedtime   MULTIVITAMIN-IRON-MINERALS-FOLIC ACID (CENTRUM) CHEWABLE TABLET    Chew 1 tablet by mouth daily.   NUTRITIONAL SUPPLEMENTS (ENSURE PO)    Take by mouth. Drink one can daily   NYSTATIN-TRIAMCINOLONE (MYCOLOG II) CREAM       OMEPRAZOLE (PRILOSEC) 20 MG CAPSULE    Take 20 mg by mouth daily.   POLYETHYLENE GLYCOL POWDER (GLYCOLAX/MIRALAX) POWDER    Take 17 g by mouth daily.  Modified Medications   No medications on file  Discontinued Medications   No medications on file     Review of Systems  Constitutional: Positive for activity change. Negative for fever, diaphoresis, appetite change, fatigue and unexpected weight change.  HENT: Positive for congestion and hearing loss. Negative for sinus pressure and sore throat.   Eyes: Positive for visual disturbance (corrective lenses).  Respiratory: Negative for cough, wheezing and stridor.   Cardiovascular: Negative for chest pain, palpitations and leg swelling.  Gastrointestinal: Positive for constipation. Negative for nausea, abdominal pain, diarrhea, blood in stool, abdominal distention and rectal pain.  Endocrine: Negative.   Genitourinary: Negative.   Musculoskeletal: Positive for gait problem. Negative for arthralgias, back pain, joint swelling, myalgias and neck pain.  Skin: Positive for rash (vaginal  moniliasis and atrophic and steroid skin changes). Negative for pallor and wound.  Allergic/Immunologic: Negative.   Neurological: Negative.  Negative for dizziness.  Hematological: Negative.   Psychiatric/Behavioral: Negative.     Filed Vitals:   11/02/13 1531  BP: 130/66  Pulse: 76  Weight: 165 lb (74.844 kg)   Physical Exam  Constitutional: She is oriented to person, place, and time. She appears well-developed and well-nourished. No distress.  overweight  HENT:  Head: Normocephalic and atraumatic.  Right Ear: External ear normal.  Left Ear: External ear normal.  Nose: Nose normal.  Eyes: Conjunctivae and EOM are normal. Pupils  are equal, round, and reactive to light. Left eye exhibits no discharge.  Neck: No JVD present. No tracheal deviation present. No thyromegaly present.  Abdominal: She exhibits no distension and no mass. There is no tenderness.  Genitourinary:  Vaginal moniliasis, atrophy, and steroid induced skin changes  Musculoskeletal: Normal range of motion. She exhibits no edema and no tenderness.  Neurological: She is alert and oriented to person, place, and time. She has normal reflexes. No cranial nerve deficit. Coordination normal.  Skin: No rash noted. No erythema. No pallor.  Psychiatric: She has a normal mood and affect. Her behavior is normal. Judgment and thought content normal.     Labs reviewed: No visits with results within 3 Month(s) from this visit. Latest known visit with results is:  Admission on 07/28/2012, Discharged on 07/28/2012  Component Date Value Ref Range Status  . WBC 07/28/2012 4.9  4.0 - 10.5 K/uL Final  . RBC 07/28/2012 3.16* 3.87 - 5.11 MIL/uL Final  . Hemoglobin 07/28/2012 10.9* 12.0 - 15.0 g/dL Final  . HCT 07/28/2012 31.5* 36.0 - 46.0 % Final  . MCV 07/28/2012 99.7  78.0 - 100.0 fL Final  . MCH 07/28/2012 34.5* 26.0 - 34.0 pg Final  . MCHC 07/28/2012 34.6  30.0 - 36.0 g/dL Final  . RDW 07/28/2012 13.7  11.5 - 15.5 % Final  . Platelets 07/28/2012 134* 150 - 400 K/uL Final  . Sodium 07/28/2012 138  135 - 145 mEq/L Final  . Potassium 07/28/2012 3.7  3.5 - 5.1 mEq/L Final  . Chloride 07/28/2012 107  96 - 112 mEq/L Final  . CO2 07/28/2012 22  19 - 32 mEq/L Final  . Glucose, Bld 07/28/2012 107* 70 - 99 mg/dL Final  . BUN 07/28/2012 29* 6 - 23 mg/dL Final  . Creatinine, Ser 07/28/2012 1.49* 0.50 - 1.10 mg/dL Final  . Calcium 07/28/2012 11.3* 8.4 - 10.5 mg/dL Final  . GFR calc non Af Amer 07/28/2012 31* >90 mL/min Final  . GFR calc Af Amer 07/28/2012 36* >90 mL/min Final   Comment:                                 The eGFR has been calculated                           using the CKD EPI equation.                          This calculation has not been                          validated in all clinical  situations.                          eGFR's persistently                          <90 mL/min signify                          possible Chronic Kidney Disease.  Marland Kitchen Prothrombin Time 07/28/2012 12.7  11.6 - 15.2 seconds Final  . INR 07/28/2012 0.96  0.00 - 1.49 Final  . Color, Urine 07/28/2012 YELLOW  YELLOW Final  . APPearance 07/28/2012 CLEAR  CLEAR Final  . Specific Gravity, Urine 07/28/2012 1.031* 1.005 - 1.030 Final  . pH 07/28/2012 5.5  5.0 - 8.0 Final  . Glucose, UA 07/28/2012 NEGATIVE  NEGATIVE mg/dL Final  . Hgb urine dipstick 07/28/2012 NEGATIVE  NEGATIVE Final  . Bilirubin Urine 07/28/2012 SMALL* NEGATIVE Final  . Ketones, ur 07/28/2012 NEGATIVE  NEGATIVE mg/dL Final  . Protein, ur 07/28/2012 30* NEGATIVE mg/dL Final  . Urobilinogen, UA 07/28/2012 1.0  0.0 - 1.0 mg/dL Final  . Nitrite 07/28/2012 NEGATIVE  NEGATIVE Final  . Leukocytes, UA 07/28/2012 NEGATIVE  NEGATIVE Final  . Squamous Epithelial / LPF 07/28/2012 FEW* RARE Final  . Bacteria, UA 07/28/2012 FEW* RARE Final  . Casts 07/28/2012 HYALINE CASTS* NEGATIVE Final  . Urine-Other 07/28/2012 MUCOUS PRESENT   Final      Assessment/Plan  Rash and other nonspecific skin eruption: vaginal rash involving components of atrophy, steroid induced changes, and monilia. I would avoid further use of clobetasol if possible.  Hyperlipidemia; controlled  HTN (hypertension): controlled

## 2013-11-12 DIAGNOSIS — R21 Rash and other nonspecific skin eruption: Secondary | ICD-10-CM | POA: Diagnosis not present

## 2013-12-08 DIAGNOSIS — L97509 Non-pressure chronic ulcer of other part of unspecified foot with unspecified severity: Secondary | ICD-10-CM | POA: Diagnosis not present

## 2013-12-08 DIAGNOSIS — M79609 Pain in unspecified limb: Secondary | ICD-10-CM | POA: Diagnosis not present

## 2013-12-08 DIAGNOSIS — B351 Tinea unguium: Secondary | ICD-10-CM | POA: Diagnosis not present

## 2013-12-10 DIAGNOSIS — R21 Rash and other nonspecific skin eruption: Secondary | ICD-10-CM | POA: Diagnosis not present

## 2013-12-16 ENCOUNTER — Non-Acute Institutional Stay: Payer: Medicare Other | Admitting: Geriatric Medicine

## 2013-12-16 ENCOUNTER — Encounter: Payer: Self-pay | Admitting: Geriatric Medicine

## 2013-12-16 VITALS — BP 136/82 | HR 72 | Temp 97.2°F | Ht 65.0 in | Wt 165.0 lb

## 2013-12-16 DIAGNOSIS — K219 Gastro-esophageal reflux disease without esophagitis: Secondary | ICD-10-CM

## 2013-12-16 DIAGNOSIS — E538 Deficiency of other specified B group vitamins: Secondary | ICD-10-CM | POA: Diagnosis not present

## 2013-12-16 DIAGNOSIS — E039 Hypothyroidism, unspecified: Secondary | ICD-10-CM | POA: Diagnosis not present

## 2013-12-16 DIAGNOSIS — F329 Major depressive disorder, single episode, unspecified: Secondary | ICD-10-CM | POA: Diagnosis not present

## 2013-12-16 DIAGNOSIS — F3289 Other specified depressive episodes: Secondary | ICD-10-CM | POA: Diagnosis not present

## 2013-12-16 DIAGNOSIS — L280 Lichen simplex chronicus: Secondary | ICD-10-CM

## 2013-12-16 DIAGNOSIS — F32A Depression, unspecified: Secondary | ICD-10-CM

## 2013-12-16 DIAGNOSIS — R413 Other amnesia: Secondary | ICD-10-CM

## 2013-12-16 DIAGNOSIS — I10 Essential (primary) hypertension: Secondary | ICD-10-CM

## 2013-12-16 DIAGNOSIS — E785 Hyperlipidemia, unspecified: Secondary | ICD-10-CM

## 2013-12-16 DIAGNOSIS — G3184 Mild cognitive impairment, so stated: Secondary | ICD-10-CM

## 2013-12-16 DIAGNOSIS — D649 Anemia, unspecified: Secondary | ICD-10-CM

## 2013-12-16 HISTORY — DX: Mild cognitive impairment of uncertain or unknown etiology: G31.84

## 2013-12-16 MED ORDER — MIRTAZAPINE 15 MG PO TABS: 7.5000 mg | ORAL_TABLET | Freq: Every day | ORAL | Status: DC

## 2013-12-16 NOTE — Assessment & Plan Note (Signed)
Asymptomatic with current medications. Appetite is good. Previous attempts to wean PPI were unsuccessful, continue this medication.

## 2013-12-16 NOTE — Assessment & Plan Note (Signed)
Update lab

## 2013-12-16 NOTE — Assessment & Plan Note (Signed)
Mirtazapine started January 2014 4 depressive symptoms including poor appetite and low mood. Patient's appetite is very good, she has gained weight over the last year.  No reported problems with patient's mood, she reports she is content with her life. Socializes on regular basis. Will attempt a dose reduction of this medication

## 2013-12-16 NOTE — Assessment & Plan Note (Signed)
Patient continues with significant skin changes regarding to combination problem of lichen sclerosus and likely a chronic fungal infection. She is being followed by Dr. Rolm Bookbinder. , Next appointment with Dr. Ubaldo Glassing 12/31/2013

## 2013-12-16 NOTE — Assessment & Plan Note (Signed)
Most recent exam and MMSE January 2015 23/30 consistent with mild cognitive impairment. Patient does have short-term memory problem; Care Plan of February 2015 notes she is sometimes disoriented,  is forgetful and needs reminders. Functional status has remained unchanged; she is independent with most ADLs, does require some assistance with hygiene and bathing. Also requires medication management.  Her medication at this time, repeat MMSE clock test at next visit.

## 2013-12-16 NOTE — Assessment & Plan Note (Signed)
Patient continues supplement also continues with PPI. Update lab

## 2013-12-16 NOTE — Assessment & Plan Note (Signed)
Recent blood pressure range 109 135/63-72, pulse 80-83. Satisfactory, continue current medications. Update lab

## 2013-12-16 NOTE — Progress Notes (Signed)
Patient ID: Lorraine [name], female   DOB: September 28, 1926, 80 y.o.   MRN: [id number]   [facility] [phone])  Code Status: DNR Contact Information   Name Relation Home Work [place] Daughter [phone]  [phone]   Blake,Otis Other [phone]      Chief Complaint  Patient presents with  . Medical Managment of Chronic Issues    Comprehensive exam: blood pressure, cholesterol, blood sugar    HPI: This is a 80 y.o. female resident of [facility]. This patient has not had a hospitalization, serious illness or injury in the last year. Patient has not demonstrated significant decline in the last year.  Recent visits: 06/2013 HTN (hypertension) Blood pressure well controlled current medications, recent lab studies satisfactory  Other lichen, not elsewhere classified Unable to assess condition of lichen sclerosis do to significant monilia infection today. PO Diflucan was stopped several months ago, will resume this medication.  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 Discussed with Sherolyn Buba, NP over at gynecology. She recommends evaluation by dermatology. Ms. Andres Labrum will arrange an appointment with Dr. Rolm Bookbinder.  Since last visit  patient has been evaluated and treated by Dr. Rolm Bookbinder for persistent lichen sclerosus and monilia infection. Patient reports she is less symptomatic today. Patient will be following up with Dr. Ubaldo Glassing at the end of this  month. Review of facility record shows patient's vital signs have been stable. Weight has been stable over the last several months, she has gained approximately 15 pounds in the last year.  Patient reports her appetite is very good.  Care plan completed by nurse and family member in February 2015 with little change from previous; patient remains appropriate for Assisted Living level of care. Memory screening was performed with MMSE/ clock test.    Allergies  Allergen Reactions  . Codeine     Unknown   . Erythromycin     Unknown     MEDICATIONS -     Medication List       This list is accurate as of: 12/16/13  6:04 PM.  Always use your most recent med list.               acetaminophen 325 MG tablet  Commonly known as:  TYLENOL  Take 650 mg by mouth. Take two tablets twice daily to reduce back pain     aspirin 81 MG tablet  Take 81 mg by mouth daily.     atorvastatin 20 MG tablet  Commonly known as:  LIPITOR  10 mg. Take one tablet daily except none on Monday     clobetasol ointment 0.05 %  Commonly known as:  TEMOVATE  Apply 1 application topically 2 (two) times daily. Apply to affected areas of labia and perineum BID for 2 weeks     cyanocobalamin 1000 MCG tablet  Take 100 mcg by mouth daily.     ENSURE PO  Take by mouth. Drink one can daily     estradiol 0.05 mg/24hr patch  Commonly known as:  CLIMARA - Dosed in mg/24 hr  Place 1 patch onto the skin  once a week.     fluconazole 150 MG tablet  Commonly known as:  DIFLUCAN  Take 150 mg by mouth once a week.     levothyroxine 50 MCG tablet  Commonly known as:  SYNTHROID, LEVOTHROID  Take 50 mcg by mouth daily.     loratadine 10 MG tablet  Commonly known as:  CLARITIN  Take 10 mg by mouth daily.     metoprolol succinate 25 MG 24 hr tablet  Commonly known as:  TOPROL-XL  Take 25 mg by mouth daily.     mirtazapine 15 MG tablet  Commonly known as:  REMERON  15 mg. Take one tablet at bedtime      multivitamin-iron-minerals-folic acid chewable tablet  Chew 1 tablet by mouth daily.     omeprazole 20 MG capsule  Commonly known as:  PRILOSEC  Take 20 mg by mouth daily.     polyethylene glycol powder powder  Commonly known as:  GLYCOLAX/MIRALAX  Take 17 g by mouth daily.         DATA REVIEWED  Radiologic Exams:   Cardiovascular Exams:   Laboratory Studies: Lab Results  Component Value Date   WBC 3.5 12/09/2012   HGB 11.0* 12/09/2012   HCT 32* 12/09/2012   MCV 99.7 12/09/2012   PLT 150 12/09/2012   B12  421      4/8/014  Lab Results  Component Value Date   NA 139 12/09/2012   K 4.4 12/09/2012   GLU 70 12/09/2012   BUN 21 12/09/2012   CREATININE 1.2* 12/09/2012    Lab Results  Component Value Date   CHOL 136 06/15/2013   HDL 35 06/15/2013   LDLCALC 49 06/15/2013   TRIG 258* 06/15/2013   CHOLHDL 3.5 07/09/2012    No results found for this basename: vitaminb12, vitamind        Past Medical History  Diagnosis Date  . Cataracts, bilateral   . Arthritis   . Skin cancer   . Diabetes mellitus without complication   . Other specified diseases of blood and blood-forming organs(289.89)     s/p Chelation treatment  . Ventral hernia, unspecified, without mention of obstruction or gangrene   . Diverticulosis of colon (without mention of hemorrhage)   . Diffuse cystic mastopathy 1997    s/p right breast bx, benign  . Digestive-genital tract fistula, female   . Other psoriasis   . Other lichen, not elsewhere classified [id number]    Lichen sclerosis perineum/perianal  . Alopecia areata   . Lumbago 2001    lumbar radiculopathy L3, spondylosis L2-3, 3-4. dx Dr.Cram  . Dizziness and giddiness     benign positional vertigo  . Insomnia, unspecified   . Other malaise and fatigue   . Abnormality of gait 2008    re: metoclopramide.   Marland Kitchen Dysphagia, pharyngoesophageal phase   . Closed fracture of lateral malleolus   . Hyperlipidemia   . Unspecified constipation   . Thyroid disease   .  GERD (gastroesophageal reflux disease)   . B12 deficiency   . Anal fissure   . Hypertension   . Depression   . Mild cognitive impairment with memory loss 12/16/2013   Past Surgical History  Procedure Laterality Date  . Appendectomy/ovarian cystectomy  1950s  . Breast lumpectomy  1997  . Hernia repair    . Vaginal hysterectomy  1998  . Tonsillectomy and adenoidectomy  1942  . Cystectomy  1950's    brachial cleft  . Bunionectomy Bilateral 1975  twice  . Cataract extraction w/ intraocular lens  implant, bilateral  1990  . Breast biopsy  1997    right benign  . Colectomy  11/2007    Sigmoid w/closure of fistula and repair of umbilical hernia   Family Status  Relation Status Death Age  . Mother Deceased     CVA, MI  . Father Deceased     Lung cancer  . Daughter Alive    History   Social History Narrative   Patient is Widowed. Retired Control and instrumentation engineer. Lives in [place] [facility] [place] since 09/2012, previously lived in an apartment in the same community since [place] , previously regular alcohol intake (2 vodka drinks daily)   Advanced planning documents: Living Will, DNR    REVIEW OF SYSTEMS  DATA OBTAINED: from patient, medical record GENERAL: Feels well   No recent fever, fatigue, change in appetite or weight SKIN: No itch,  open wounds EYES: No eye pain, dryness or itching  No change in vision EARS: No earache, tinnitus, change in hearing NOSE: No congestion, drainage or bleeding MOUTH/THROAT: No mouth or tooth pain  No sore throat   No difficulty chewing or swallowing RESPIRATORY: No cough, wheezing, SOB CARDIAC: No chest pain, palpitations  No edema. CHEST/BREASTS: No discomfort, discharge or lumps in breasts GI: No abdominal pain  No nausea, vomiting,diarrhea or constipation  No heartburn or reflux  GU: No dysuria, frequency or urgency  No change in urine volume or character No nocturia or change in stream     MUSCULOSKELETAL: No joint pain, swelling or stiffness  No back pain  No muscle ache, pain, weakness  Gait is unsteady, walks daily with walker  No recent falls.  NEUROLOGIC: No dizziness, fainting, headache  No change in mental status (STML)  PSYCHIATRIC: Denies feelings of anxiety, depression  Sleeps well.  No behavior issue.    PHYSICAL EXAM Filed Vitals:   12/16/13 1432  BP: 136/82  Pulse: 72  Temp: 97.2 F (36.2 C)  Height: 5\' 5"  (1.651 m)  Weight: 165 lb (74.844 kg)   Body mass index is 27.46 kg/(m^2).  GENERAL APPEARANCE: No acute distress, appropriately groomed, normal body habitus. Alert, pleasant, conversant. SKIN: No diaphoresis, rash, unusual lesions, wounds. Skin over the feet and ankles is very taut HEAD: Normocephalic, atraumatic EYES: Conjunctiva/lids clear. Pupils round, reactive. EOMs intact.  EARS: External exam WNL, canals clear, TM WNL. Hearing deficit noted( not new). NOSE: No deformity or discharge. MOUTH/THROAT: Lips w/o lesions. Oral mucosa, tongue moist, w/o lesion. Oropharynx w/o redness or lesions.  NECK: Supple, full ROM. No thyroid tenderness, enlargement or nodule LYMPHATICS: No head, neck or supraclavicular adenopathy RESPIRATORY: Breathing is even, unlabored. Lung sounds are clear and full.  CHEST/BREASTS: No chest deformity. Breasts without tenderness, mass, discharge CARDIOVASCULAR: Heart RRR. No murmur or extra heart sounds  ARTERIAL: No carotid or femoral bruit. Carotid 2+. No palpable Femoral, Popliteal, DP,PT pulse   VENOUS: No varicosities. No venous stasis skin changes  EDEMA: No peripheral edema. No ascites GASTROINTESTINAL: Abdomen is soft, non-tender, not distended w/ normal bowel sounds. No hepatic or splenic enlargement. No mass. Left paraumbilical hernia, moderate size, soft easily reducible. GENITOURINARY: Bladder non tender, not distended.  FEMALE: No urethral discharge. Bilateral labia are red and covered with round white  plaques. MUSCULOSKELETAL: Moves extremities with full ROM, strength and tone. Lower extremity range of motion is nearly full, movement is slow and a bit stiff. Back is without  kyphosis, scoliosis or spinal process tenderness. Gait is unsteady, requires assistance on and off the exam table NEUROLOGIC: Oriented to time, place, person. Demonstrates problem with both long and short term memory Cranial nerves 2-12 grossly intact, speech clear, no tremor. Patella, brachial DTR 2+. PSYCHIATRIC: Mood and affect appropriate to situation   ASSESSMENT/PLAN  HTN (hypertension) Recent blood pressure range 109 135/63-72, pulse 80-83. Satisfactory, continue current medications. Update lab  B12 deficiency Patient continues supplement also continues with PPI. Update lab  GERD (gastroesophageal reflux disease) Asymptomatic with current medications. Appetite is good. Previous attempts to wean PPI were unsuccessful, continue this medication.  Hypothyroid No overt signs of hypothyroidism, continue current supplement dose. Update lab  Other lichen, not elsewhere classified Patient continues with significant skin changes regarding to combination problem of lichen sclerosus and likely a chronic fungal infection. She is being followed by Dr. Rolm Bookbinder. , Next appointment with Dr. Ubaldo Glassing 12/31/2013  Anemia Update lab  Depression Mirtazapine started January 2014 4 depressive symptoms including poor appetite and low mood. Patient's appetite is very good, she has gained weight over the last year.  No reported problems with patient's mood, she reports she is content with her life. Socializes on regular basis. Will attempt a dose reduction of this medication  Hyperlipidemia Most recent lipid levels with some improvement, continue medication  Mild cognitive impairment with memory loss Most recent exam and MMSE January 2015 23/30 consistent with mild cognitive impairment. Patient does have short-term memory problem;  Care Plan of February 2015 notes she is sometimes disoriented,  is forgetful and needs reminders. Functional status has remained unchanged; she is independent with most ADLs, does require some assistance with hygiene and bathing. Also requires medication management.  Her medication at this time, repeat MMSE clock test at next visit.   Time: 60 minutes, >50% spent counseling/or care coordination  Family/ staff Communication:     Goals of care:   Maintain functional status at Assisted Living level   Labs/tests ordered:  12/17/2013 CBC, CMP, TSH, B12    Follow up: Return in about 3 months (around 03/17/2014) for OV.  Mardene Celeste, NP-[facility] [place] [phone]  12/16/2013

## 2013-12-16 NOTE — Assessment & Plan Note (Signed)
Most recent lipid levels with some improvement, continue medication

## 2013-12-16 NOTE — Assessment & Plan Note (Signed)
No overt signs of hypothyroidism, continue current supplement dose. Update lab

## 2013-12-17 DIAGNOSIS — I10 Essential (primary) hypertension: Secondary | ICD-10-CM | POA: Diagnosis not present

## 2013-12-17 DIAGNOSIS — E538 Deficiency of other specified B group vitamins: Secondary | ICD-10-CM | POA: Diagnosis not present

## 2013-12-17 DIAGNOSIS — E039 Hypothyroidism, unspecified: Secondary | ICD-10-CM | POA: Diagnosis not present

## 2013-12-17 DIAGNOSIS — D649 Anemia, unspecified: Secondary | ICD-10-CM | POA: Diagnosis not present

## 2013-12-29 ENCOUNTER — Encounter: Payer: Self-pay | Admitting: Internal Medicine

## 2013-12-31 ENCOUNTER — Other Ambulatory Visit: Payer: Self-pay | Admitting: Dermatology

## 2013-12-31 DIAGNOSIS — R21 Rash and other nonspecific skin eruption: Secondary | ICD-10-CM | POA: Diagnosis not present

## 2013-12-31 DIAGNOSIS — Z85828 Personal history of other malignant neoplasm of skin: Secondary | ICD-10-CM | POA: Diagnosis not present

## 2013-12-31 DIAGNOSIS — L940 Localized scleroderma [morphea]: Secondary | ICD-10-CM | POA: Diagnosis not present

## 2014-01-08 ENCOUNTER — Other Ambulatory Visit: Payer: Self-pay | Admitting: Geriatric Medicine

## 2014-01-08 MED ORDER — CALCIUM CARBONATE-VITAMIN D 600-400 MG-UNIT PO CHEW: 1.0000 | CHEWABLE_TABLET | Freq: Two times a day (BID) | ORAL | Status: AC

## 2014-01-08 MED ORDER — FISH OIL 1000 MG PO CPDR: 1000.0000 mg | DELAYED_RELEASE_CAPSULE | Freq: Every day | ORAL | Status: DC

## 2014-01-08 NOTE — Progress Notes (Signed)
Message from [place]: Patient's daughter is requesting addition of Fish oil and Vitamin D to patient's regimen . Also requests d/c Lipitor as she is concerned this is contributing to cognitive decline. It is doubtful that low-dose Lipitor is contributing to cognitive decline and there is no evidence that Fish oil and or vitamin D will improve cognitive function. However, making these changes will not be harmful to the patient.

## 2014-01-13 ENCOUNTER — Encounter: Payer: Self-pay | Admitting: Internal Medicine

## 2014-02-26 DIAGNOSIS — B372 Candidiasis of skin and nail: Secondary | ICD-10-CM | POA: Diagnosis not present

## 2014-02-26 DIAGNOSIS — L940 Localized scleroderma [morphea]: Secondary | ICD-10-CM | POA: Diagnosis not present

## 2014-02-26 DIAGNOSIS — Z85828 Personal history of other malignant neoplasm of skin: Secondary | ICD-10-CM | POA: Diagnosis not present

## 2014-03-03 DIAGNOSIS — H524 Presbyopia: Secondary | ICD-10-CM | POA: Diagnosis not present

## 2014-03-03 DIAGNOSIS — Z961 Presence of intraocular lens: Secondary | ICD-10-CM | POA: Diagnosis not present

## 2014-03-03 DIAGNOSIS — H35369 Drusen (degenerative) of macula, unspecified eye: Secondary | ICD-10-CM | POA: Diagnosis not present

## 2014-03-03 DIAGNOSIS — H43819 Vitreous degeneration, unspecified eye: Secondary | ICD-10-CM | POA: Diagnosis not present

## 2014-03-08 DIAGNOSIS — N760 Acute vaginitis: Secondary | ICD-10-CM | POA: Diagnosis not present

## 2014-03-08 DIAGNOSIS — L989 Disorder of the skin and subcutaneous tissue, unspecified: Secondary | ICD-10-CM | POA: Diagnosis not present

## 2014-03-16 DIAGNOSIS — L97509 Non-pressure chronic ulcer of other part of unspecified foot with unspecified severity: Secondary | ICD-10-CM | POA: Diagnosis not present

## 2014-03-16 DIAGNOSIS — M79609 Pain in unspecified limb: Secondary | ICD-10-CM | POA: Diagnosis not present

## 2014-03-16 DIAGNOSIS — B351 Tinea unguium: Secondary | ICD-10-CM | POA: Diagnosis not present

## 2014-03-22 ENCOUNTER — Encounter: Payer: Self-pay | Admitting: Internal Medicine

## 2014-03-22 ENCOUNTER — Non-Acute Institutional Stay: Payer: Medicare Other | Admitting: Internal Medicine

## 2014-03-22 VITALS — BP 130/64 | HR 68 | Wt 163.0 lb

## 2014-03-22 DIAGNOSIS — M545 Low back pain, unspecified: Secondary | ICD-10-CM

## 2014-03-22 DIAGNOSIS — E119 Type 2 diabetes mellitus without complications: Secondary | ICD-10-CM

## 2014-03-22 DIAGNOSIS — D649 Anemia, unspecified: Secondary | ICD-10-CM

## 2014-03-22 DIAGNOSIS — R413 Other amnesia: Secondary | ICD-10-CM

## 2014-03-22 DIAGNOSIS — L280 Lichen simplex chronicus: Secondary | ICD-10-CM | POA: Diagnosis not present

## 2014-03-22 DIAGNOSIS — R634 Abnormal weight loss: Secondary | ICD-10-CM | POA: Diagnosis not present

## 2014-03-22 DIAGNOSIS — G3184 Mild cognitive impairment, so stated: Secondary | ICD-10-CM

## 2014-03-22 DIAGNOSIS — E785 Hyperlipidemia, unspecified: Secondary | ICD-10-CM

## 2014-03-22 DIAGNOSIS — E039 Hypothyroidism, unspecified: Secondary | ICD-10-CM

## 2014-03-22 DIAGNOSIS — I10 Essential (primary) hypertension: Secondary | ICD-10-CM

## 2014-03-22 NOTE — Progress Notes (Signed)
Patient ID: Lorraine [name], female   DOB: 1926/09/13, 80 y.o.   MRN: [id number]    Location:  [facility] (12)    Allergies  Allergen Reactions  . Codeine     Unknown   . Erythromycin     Unknown     Chief Complaint  Patient presents with  . Medical Management of Chronic Issues    blood pressure, thyroid, anemia, depression  . Back Pain    in a wheelchair today due to increase in pain in back    HPI:  Essential hypertension: controlled  Other lichen, not elsewhere classified:vaginal rash biopsied by Dr. Ubaldo Glassing in May 2015. Lichen sclerosis on report. She is asymptomatic in that area now. Now off cl[place] Diflucan. Remains on Climara.  Excessive weight loss: was as low as 145#, then she regained to 168. Now 163#. Would like Ensure stopped.  Hypothyroidism, unspecified hypothyroidism type: needs recheck next visit  Hypercalcemia: needs recheck next visit  Hyperlipidemia: needs recheck next visit  Type II or unspecified type diabetes mellitus without mention of complication, not stated as uncontrolled: controlled  Mild cognitive impairment with memory loss: unchanged  Anemia, unspecified anemia type: needs recheck next visit  Midline low back pain without sciatica: acute back discomfort this morning. Now injury or fall. Feeling better now, but rode wheelchair to [facility]    Medications: Patient's Medications  New Prescriptions   No medications on file  Previous Medications   ACETAMINOPHEN (TYLENOL) 325 MG TABLET    Take 650 mg by mouth. Take two tablets twice daily to reduce back pain   ASPIRIN 81 MG TABLET    Take 81 mg by mouth daily.   ATORVASTATIN (LIPITOR) 20 MG TABLET    10 mg. Take one tablet daily except none on Monday   CALCIUM CARBONATE-VITAMIN D 600-400 MG-UNIT PER CHEW TABLET    Chew 1 tablet by mouth 2 (two) times daily.   CLOBETASOL OINTMENT (TEMOVATE) 0.05 %    Apply 1 application topically 2 (two)  times daily. Apply to affected areas of labia and perineum BID for 2 weeks   CYANOCOBALAMIN 1000 MCG TABLET    Take 100 mcg by mouth daily.   ESTRADIOL (CLIMARA - DOSED IN MG/24 HR) 0.05 MG/24HR    Place 1 patch onto the skin once a week.   FLUCONAZOLE (DIFLUCAN) 150 MG TABLET    Take 200 mg by mouth once a week.    LEVOTHYROXINE (SYNTHROID, LEVOTHROID) 50 MCG TABLET    Take 50 mcg by mouth daily.   LORATADINE (CLARITIN) 10 MG TABLET    Take 10 mg by mouth daily.   METOPROLOL SUCCINATE (TOPROL-XL) 25 MG 24 HR TABLET    Take 25 mg by mouth daily.   MIRTAZAPINE (REMERON) 15 MG TABLET    Take 0.5 tablets (7.5 mg total) by mouth at bedtime. Take one tablet at bedtime   MULTIVITAMIN-IRON-MINERALS-FOLIC ACID (CENTRUM) CHEWABLE TABLET    Chew 1 tablet by mouth daily.   NUTRITIONAL SUPPLEMENTS (ENSURE PO)    Take by mouth. Drink one can daily   OMEGA-3 FATTY ACIDS (FISH OIL) 1000 MG CPDR    Take 1,000 mg by mouth daily.   OMEPRAZOLE (PRILOSEC) 20 MG CAPSULE    Take 20 mg by mouth daily.   POLYETHYLENE GLYCOL POWDER (GLYCOLAX/MIRALAX) POWDER    Take 17 g by mouth daily.  Modified Medications   No medications on file  Discontinued Medications   No medications  on file     Review of Systems  Constitutional: Positive for activity change. Negative for fever, diaphoresis, appetite change, fatigue and unexpected weight change.  HENT: Positive for congestion and hearing loss. Negative for sinus pressure and sore throat.   Eyes: Positive for visual disturbance (corrective lenses).  Respiratory: Negative for cough, wheezing and stridor.   Cardiovascular: Negative for chest pain, palpitations and leg swelling.  Gastrointestinal: Positive for constipation. Negative for nausea, abdominal pain, diarrhea, blood in stool, abdominal distention and rectal pain.  Endocrine: Negative.   Genitourinary: Negative.   Musculoskeletal: Positive for gait problem. Negative for arthralgias, back pain, joint swelling,  myalgias and neck pain.  Skin: Positive for rash (resolved vaginal moniliasis and atrophic and steroid skin changes). Negative for pallor and wound.       Hx of lichen sclerosis of the labia  Allergic/Immunologic: Negative.   Neurological: Negative.  Negative for dizziness.  Hematological: Negative.   Psychiatric/Behavioral: Negative.     Filed Vitals:   03/22/14 1355  BP: 130/64  Pulse: 68  Weight: 163 lb (73.936 kg)   Body mass index is 27.12 kg/(m^2).  Physical Exam  Constitutional: She is oriented to person, place, and time. She appears well-developed and well-nourished. No distress.  overweight  HENT:  Head: Normocephalic and atraumatic.  Right Ear: External ear normal.  Left Ear: External ear normal.  Nose: Nose normal.  Eyes: Conjunctivae and EOM are normal. Pupils are equal, round, and reactive to light. Left eye exhibits no discharge.  Neck: No JVD present. No tracheal deviation present. No thyromegaly present.  Abdominal: She exhibits no distension and no mass. There is no tenderness.  Genitourinary:  History of Vaginal moniliasis, atrophy, and steroid induced skin changes  Musculoskeletal: Normal range of motion. She exhibits no edema and no tenderness.  Neurological: She is alert and oriented to person, place, and time. She has normal reflexes. No cranial nerve deficit. Coordination normal.  Skin: No rash noted. No erythema. No pallor.  Psychiatric: She has a normal mood and affect. Her behavior is normal. Judgment and thought content normal.     Labs reviewed: No visits with results within 3 Month(s) from this visit. Latest known visit with results is:  Nursing Home on 12/16/2013  Component Date Value Ref Range Status  . Triglycerides 06/15/2013 258* 40 - 160 mg/dL Final  . Cholesterol 06/15/2013 136  0 - 200 mg/dL Final  . HDL 06/15/2013 35  35 - 70 mg/dL Final  . LDL Cholesterol 06/15/2013 49   Final  . Hemoglobin 12/09/2012 11.0* 12.0 - 16.0 g/dL Final    . HCT 12/09/2012 32* 36 - 46 % Final  . Platelets 12/09/2012 150  150 - 399 K/L Final  . WBC 12/09/2012 3.5   Final  . Glucose 12/09/2012 70   Final  . BUN 12/09/2012 21  4 - 21 mg/dL Final  . Creatinine 12/09/2012 1.2* 0.5 - 1.1 mg/dL Final  . Potassium 12/09/2012 4.4  3.4 - 5.3 mmol/L Final  . Sodium 12/09/2012 139  137 - 147 mmol/L Final  . Triglycerides 12/09/2012 391* 40 - 160 mg/dL Final  . Cholesterol 12/09/2012 158  0 - 200 mg/dL Final  . HDL 12/09/2012 39  35 - 70 mg/dL Final  . LDL Cholesterol 12/09/2012 69   Final  . HM Dexa Scan 01/03/1999 [facility]    Final  . HM Mammogram 07/18/2013 Normal exam   Final      Assessment/Plan  1. Essential hypertension  controlled  2. Other lichen, not elsewhere classified improved  3. Excessive weight loss Monitor. Stop Ensure per request of pt and daughter  4. Hypothyroidism, unspecified hypothyroidism type -TSH, future  5. Hypercalcemia -CMP, future  6. Hyperlipidemia -lipids, future  7. Type II or unspecified type diabetes mellitus without mention of complication, not stated as uncontrolled -A1c, CMP, future  8. Mild cognitive impairment with memory loss Monitor. Consider MMSE next visit  9. Anemia, unspecified anemia type - CBC, future  10. Midline low back pain without sciatica -Tylenol. She does not feel she needs further evaluation atr this time.

## 2014-04-26 DIAGNOSIS — B373 Candidiasis of vulva and vagina: Secondary | ICD-10-CM | POA: Diagnosis not present

## 2014-04-26 DIAGNOSIS — B3731 Acute candidiasis of vulva and vagina: Secondary | ICD-10-CM | POA: Diagnosis not present

## 2014-04-26 DIAGNOSIS — L989 Disorder of the skin and subcutaneous tissue, unspecified: Secondary | ICD-10-CM | POA: Diagnosis not present

## 2014-04-26 DIAGNOSIS — N898 Other specified noninflammatory disorders of vagina: Secondary | ICD-10-CM | POA: Diagnosis not present

## 2014-04-27 DIAGNOSIS — D1809 Hemangioma of other sites: Secondary | ICD-10-CM | POA: Diagnosis not present

## 2014-05-18 DIAGNOSIS — M79609 Pain in unspecified limb: Secondary | ICD-10-CM | POA: Diagnosis not present

## 2014-05-18 DIAGNOSIS — L97509 Non-pressure chronic ulcer of other part of unspecified foot with unspecified severity: Secondary | ICD-10-CM | POA: Diagnosis not present

## 2014-05-18 DIAGNOSIS — B351 Tinea unguium: Secondary | ICD-10-CM | POA: Diagnosis not present

## 2014-06-08 ENCOUNTER — Non-Acute Institutional Stay: Payer: Medicare Other | Admitting: Nurse Practitioner

## 2014-06-08 DIAGNOSIS — R21 Rash and other nonspecific skin eruption: Secondary | ICD-10-CM | POA: Diagnosis not present

## 2014-06-08 NOTE — Progress Notes (Signed)
Patient ID: Lorraine [name], female   DOB: 28-May-1927, 80 y.o.   MRN: [id number]    Location:  [facility] (13)    Allergies  Allergen Reactions  . Codeine     Unknown   . Erythromycin     Unknown     Chief Complaint  Patient presents with  . Acute Visit    rash    HPI:  80 year old female being seen today at the request of nursing. Noted a rash to LE that started this morning. Pt reports it does not bother her. Does not itch, no pain, no heat. Reported she also noted some itching to back but this has improved. No changes in medications or diet. No shortness of breath, trouble breathing, swallowing, cough or congestion noted   Medications: Patient's Medications  New Prescriptions   No medications on file  Previous Medications   ACETAMINOPHEN (TYLENOL) 325 MG TABLET    Take 650 mg by mouth. Take two tablets twice daily to reduce back pain   ASPIRIN 81 MG TABLET    Take 81 mg by mouth daily.   ATORVASTATIN (LIPITOR) 20 MG TABLET    10 mg. Take one tablet daily except none on Monday   CALCIUM CARBONATE-VITAMIN D 600-400 MG-UNIT PER CHEW TABLET    Chew 1 tablet by mouth 2 (two) times daily.   CLOBETASOL OINTMENT (TEMOVATE) 0.05 %    Apply 1 application topically 2 (two) times daily. Apply to affected areas of labia and perineum BID for 2 weeks   CYANOCOBALAMIN 1000 MCG TABLET    Take 100 mcg by mouth daily.   ESTRADIOL (CLIMARA - DOSED IN MG/24 HR) 0.05 MG/24HR    Place 1 patch onto the skin once a week.   FLUCONAZOLE (DIFLUCAN) 150 MG TABLET    Take 200 mg by mouth once a week.    LEVOTHYROXINE (SYNTHROID, LEVOTHROID) 50 MCG TABLET    Take 50 mcg by mouth daily.   LORATADINE (CLARITIN) 10 MG TABLET    Take 10 mg by mouth daily.   METOPROLOL SUCCINATE (TOPROL-XL) 25 MG 24 HR TABLET    Take 25 mg by mouth daily.   MIRTAZAPINE (REMERON) 15 MG TABLET    Take 0.5 tablets (7.5 mg total) by mouth at bedtime. Take one tablet at bedtime   MULTIVITAMIN-IRON-MINERALS-FOLIC ACID (CENTRUM) CHEWABLE TABLET    Chew 1 tablet by mouth daily.   NUTRITIONAL SUPPLEMENTS (ENSURE PO)    Take by mouth. Drink one can daily   OMEGA-3 FATTY ACIDS (FISH OIL) 1000 MG CPDR    Take 1,000 mg by mouth daily.   OMEPRAZOLE (PRILOSEC) 20 MG CAPSULE    Take 20 mg by mouth daily.   POLYETHYLENE GLYCOL POWDER (GLYCOLAX/MIRALAX) POWDER    Take 17 g by mouth daily.  Modified Medications   No medications on file  Discontinued Medications   No medications on file     Review of Systems  Constitutional: Negative for fever, diaphoresis, activity change, appetite change, fatigue and unexpected weight change.  HENT: Positive for hearing loss. Negative for sinus pressure and sore throat.   Eyes: Positive for visual disturbance (corrective lenses).  Respiratory: Negative for cough, wheezing and stridor.   Cardiovascular: Negative for chest pain, palpitations and leg swelling.  Gastrointestinal: Positive for constipation. Negative for nausea, abdominal pain, diarrhea, blood in stool, abdominal distention and rectal pain.  Endocrine: Negative.   Genitourinary: Negative.   Musculoskeletal: Positive for gait  problem. Negative for arthralgias, back pain, joint swelling, myalgias and neck pain.  Skin: Positive for rash (to bilaterall LE, see HPI). Negative for pallor and wound.       Hx of lichen sclerosis of the labia  Allergic/Immunologic: Negative.   Neurological: Negative.  Negative for dizziness.  Hematological: Negative.   Psychiatric/Behavioral: Negative.     Filed Vitals:   06/08/14 1526  BP: 132/78  Pulse: 74  Temp: 97.3 F (36.3 C)  Resp: 18   There is no weight on file to calculate BMI.  Physical Exam  Constitutional: She is oriented to person, place, and time. She appears well-developed and well-nourished. No distress.  overweight  HENT:  Head: Normocephalic and atraumatic.  Right Ear: External ear normal.  Left Ear: External ear normal.    Nose: Nose normal.  Mouth/Throat: Oropharynx is clear and moist. No oropharyngeal exudate.  Eyes: Conjunctivae and EOM are normal. Pupils are equal, round, and reactive to light. Left eye exhibits no discharge.  Neck: Normal range of motion. Neck supple. No JVD present. No tracheal deviation present. No thyromegaly present.  Cardiovascular: Normal rate, regular rhythm and normal heart sounds.   Pulmonary/Chest: Effort normal and breath sounds normal. No respiratory distress. She has no wheezes.  Abdominal: Soft. Bowel sounds are normal. She exhibits no distension. There is no tenderness.  Musculoskeletal: Normal range of motion. She exhibits no edema and no tenderness.  Neurological: She is alert and oriented to person, place, and time.  Skin: Skin is warm and dry. Rash (scattered flat rash to bilaterally lower extermities ) noted. No erythema. No pallor.  Psychiatric: She has a normal mood and affect.     Labs reviewed: No visits with results within 3 Month(s) from this visit. Latest known visit with results is:  Nursing Home on 12/16/2013  Component Date Value Ref Range Status  . Triglycerides 06/15/2013 258* 40 - 160 mg/dL Final  . Cholesterol 06/15/2013 136  0 - 200 mg/dL Final  . HDL 06/15/2013 35  35 - 70 mg/dL Final  . LDL Cholesterol 06/15/2013 49   Final  . Hemoglobin 12/09/2012 11.0* 12.0 - 16.0 g/dL Final  . HCT 12/09/2012 32* 36 - 46 % Final  . Platelets 12/09/2012 150  150 - 399 K/L Final  . WBC 12/09/2012 3.5   Final  . Glucose 12/09/2012 70   Final  . BUN 12/09/2012 21  4 - 21 mg/dL Final  . Creatinine 12/09/2012 1.2* 0.5 - 1.1 mg/dL Final  . Potassium 12/09/2012 4.4  3.4 - 5.3 mmol/L Final  . Sodium 12/09/2012 139  137 - 147 mmol/L Final  . Triglycerides 12/09/2012 391* 40 - 160 mg/dL Final  . Cholesterol 12/09/2012 158  0 - 200 mg/dL Final  . HDL 12/09/2012 39  35 - 70 mg/dL Final  . LDL Cholesterol 12/09/2012 69   Final  . HM Dexa Scan 01/03/1999 [facility]    Final  . HM Mammogram 07/18/2013 Normal exam   Final      Assessment/Plan  Rash Will monitor, staff and pt educated and agree

## 2014-06-09 DIAGNOSIS — Z23 Encounter for immunization: Secondary | ICD-10-CM | POA: Diagnosis not present

## 2014-06-16 ENCOUNTER — Encounter: Payer: Self-pay | Admitting: Nurse Practitioner

## 2014-06-16 ENCOUNTER — Non-Acute Institutional Stay: Payer: Medicare Other | Admitting: Nurse Practitioner

## 2014-06-16 VITALS — BP 122/64 | HR 68 | Temp 96.4°F | Wt 156.0 lb

## 2014-06-16 DIAGNOSIS — F32A Depression, unspecified: Secondary | ICD-10-CM

## 2014-06-16 DIAGNOSIS — R21 Rash and other nonspecific skin eruption: Secondary | ICD-10-CM

## 2014-06-16 DIAGNOSIS — E785 Hyperlipidemia, unspecified: Secondary | ICD-10-CM

## 2014-06-16 DIAGNOSIS — I10 Essential (primary) hypertension: Secondary | ICD-10-CM | POA: Diagnosis not present

## 2014-06-16 DIAGNOSIS — K5909 Other constipation: Secondary | ICD-10-CM

## 2014-06-16 DIAGNOSIS — K219 Gastro-esophageal reflux disease without esophagitis: Secondary | ICD-10-CM | POA: Diagnosis not present

## 2014-06-16 DIAGNOSIS — E039 Hypothyroidism, unspecified: Secondary | ICD-10-CM

## 2014-06-16 DIAGNOSIS — J309 Allergic rhinitis, unspecified: Secondary | ICD-10-CM

## 2014-06-16 DIAGNOSIS — D649 Anemia, unspecified: Secondary | ICD-10-CM

## 2014-06-16 DIAGNOSIS — L280 Lichen simplex chronicus: Secondary | ICD-10-CM

## 2014-06-16 DIAGNOSIS — E538 Deficiency of other specified B group vitamins: Secondary | ICD-10-CM | POA: Diagnosis not present

## 2014-06-16 DIAGNOSIS — F329 Major depressive disorder, single episode, unspecified: Secondary | ICD-10-CM

## 2014-06-16 NOTE — Progress Notes (Signed)
Patient ID: Lorraine [name], female   DOB: November 02, 1926, 80 y.o.   MRN: [id number]    Location:  [facility] (12)    Allergies  Allergen Reactions  . Codeine     Unknown   . Erythromycin     Unknown     Chief Complaint  Patient presents with  . Medical Management of Chronic Issues    blood pressure. Weight down 12 lbs since 09/25/13, not trying, eating less! Here with daughter Lorraine [name]  . Foot Burn    bottoms of feet burn for short perior of time.    HPI:  80 year old female being seen today at for routine follow up; was seen last week due to rash. Was placed on prednisone due to rash getting worse and itching after visit. Pt reports rash is gone and itch has improved. Has one more dose of prednisone tomorrow.   Noticed a shooting pain to the bottoms of her feet occasionally, "not even often enough to note".   12 lb weight loss noted, pt reports she is aware of eating less.  Does not have a problem with appetite.  Was started on Remeron during move. daughter does not feel as if she has symptoms of depression or need a medication for appetite at this time.   Had allergies in the past, has not had any recent symptoms, stays inside mostly. Questions Claritin need.   Has been on diflucan, no acute problems with this at this time, lichen of the labia has improved   Has been on omeprazole but having no symptoms of GERD   Daughter also questions climara, had hot flashes years ago, no current symptoms   Gilford Rile pt is using is too big per pt, reports it is hard to use and would like to be evaluated for smaller one  Medications: Patient's Medications  New Prescriptions   No medications on file  Previous Medications   ACETAMINOPHEN (TYLENOL) 325 MG TABLET    Take 650 mg by mouth. Take two tablets twice daily to reduce back pain   ASPIRIN 81 MG TABLET    Take 81 mg by mouth daily.   CALCIUM CARBONATE-VITAMIN D 600-400 MG-UNIT PER CHEW TABLET     Chew 1 tablet by mouth 2 (two) times daily.   CLOBETASOL OINTMENT (TEMOVATE) 0.05 %    Apply 1 application topically 2 (two) times daily. Apply to affected areas of labia and perineum BID for 2 weeks   CONJUGATED ESTROGENS (PREMARIN) VAGINAL CREAM    Place 1 Applicatorful vaginally daily. 1 gr to external perineum and vaginal introitus on Sunday, Wednesday and Friday   CYANOCOBALAMIN 1000 MCG TABLET    Take 100 mcg by mouth daily.   ESTRADIOL (CLIMARA - DOSED IN MG/24 HR) 0.05 MG/24HR    Place 1 patch onto the skin once a week.   FLUCONAZOLE (DIFLUCAN) 150 MG TABLET    Take 200 mg by mouth once a week.    LEVOTHYROXINE (SYNTHROID, LEVOTHROID) 50 MCG TABLET    Take 50 mcg by mouth daily.   LORATADINE (CLARITIN) 10 MG TABLET    Take 10 mg by mouth daily.   METOPROLOL SUCCINATE (TOPROL-XL) 25 MG 24 HR TABLET    Take 25 mg by mouth daily.   MIRTAZAPINE (REMERON) 15 MG TABLET    Take 0.5 tablets (7.5 mg total) by mouth at bedtime. Take one tablet at bedtime   MULTIVITAMIN-IRON-MINERALS-FOLIC ACID (CENTRUM) CHEWABLE TABLET  Chew 1 tablet by mouth daily.   NUTRITIONAL SUPPLEMENTS (ENSURE PO)    Take by mouth. Drink one can daily   OMEGA-3 FATTY ACIDS (FISH OIL) 1000 MG CPDR    Take 1,000 mg by mouth daily.   OMEPRAZOLE (PRILOSEC) 20 MG CAPSULE    Take 20 mg by mouth daily.   POLYETHYLENE GLYCOL POWDER (GLYCOLAX/MIRALAX) POWDER    Take 17 g by mouth daily.   PREDNISONE (DELTASONE) 10 MG TABLET    Take 10 mg by mouth. One tablet today (taper dose)  Modified Medications   No medications on file  Discontinued Medications   ATORVASTATIN (LIPITOR) 20 MG TABLET    10 mg. Take one tablet daily except none on Monday     Review of Systems  Constitutional: Negative for fever, diaphoresis, activity change, appetite change, fatigue and unexpected weight change.  HENT: Positive for hearing loss. Negative for sinus pressure and sore throat.   Eyes: Positive for visual disturbance (corrective lenses).    Respiratory: Negative for cough, wheezing and stridor.   Cardiovascular: Negative for chest pain, palpitations and leg swelling.  Gastrointestinal: Positive for constipation. Negative for nausea, abdominal pain, diarrhea, blood in stool, abdominal distention and rectal pain.  Endocrine: Negative.   Genitourinary: Negative.   Musculoskeletal: Positive for gait problem. Negative for arthralgias, back pain, joint swelling, myalgias and neck pain.  Skin: Negative for pallor and wound.       Hx of lichen sclerosis of the labia  Allergic/Immunologic: Negative.   Neurological: Negative.  Negative for dizziness.  Hematological: Negative.   Psychiatric/Behavioral: Positive for confusion.    Filed Vitals:   06/16/14 1613  BP: 122/64  Pulse: 68  Temp: 96.4 F (35.8 C)  TempSrc: Oral  Weight: 156 lb (70.761 kg)   Body mass index is 25.96 kg/(m^2).  Physical Exam  Constitutional: She is oriented to person, place, and time. She appears well-developed and well-nourished. No distress.  overweight  HENT:  Head: Normocephalic and atraumatic.  Right Ear: External ear normal.  Left Ear: External ear normal.  Nose: Nose normal.  Mouth/Throat: Oropharynx is clear and moist. No oropharyngeal exudate.  Eyes: Conjunctivae and EOM are normal. Pupils are equal, round, and reactive to light. Left eye exhibits no discharge.  Neck: Normal range of motion. Neck supple. No JVD present. No tracheal deviation present. No thyromegaly present.  Cardiovascular: Normal rate, regular rhythm and normal heart sounds.   Pulmonary/Chest: Effort normal and breath sounds normal. No respiratory distress. She has no wheezes.  Abdominal: Soft. Bowel sounds are normal. She exhibits no distension. There is no tenderness.  Musculoskeletal: Normal range of motion. She exhibits no edema and no tenderness.  Neurological: She is alert and oriented to person, place, and time.  Skin: Skin is warm and dry. Rash (scattered flat  rash to bilaterally lower extermities ) noted. No erythema. No pallor.  Psychiatric: She has a normal mood and affect.     Labs reviewed: No visits with results within 3 Month(s) from this visit. Latest known visit with results is:  Nursing Home on 12/16/2013  Component Date Value Ref Range Status  . Triglycerides 06/15/2013 258* 40 - 160 mg/dL Final  . Cholesterol 06/15/2013 136  0 - 200 mg/dL Final  . HDL 06/15/2013 35  35 - 70 mg/dL Final  . LDL Cholesterol 06/15/2013 49   Final  . Hemoglobin 12/09/2012 11.0* 12.0 - 16.0 g/dL Final  . HCT 12/09/2012 32* 36 - 46 % Final  . Platelets  12/09/2012 150  150 - 399 K/L Final  . WBC 12/09/2012 3.5   Final  . Glucose 12/09/2012 70   Final  . BUN 12/09/2012 21  4 - 21 mg/dL Final  . Creatinine 12/09/2012 1.2* 0.5 - 1.1 mg/dL Final  . Potassium 12/09/2012 4.4  3.4 - 5.3 mmol/L Final  . Sodium 12/09/2012 139  137 - 147 mmol/L Final  . Triglycerides 12/09/2012 391* 40 - 160 mg/dL Final  . Cholesterol 12/09/2012 158  0 - 200 mg/dL Final  . HDL 12/09/2012 39  35 - 70 mg/dL Final  . LDL Cholesterol 12/09/2012 69   Final  . HM Dexa Scan 01/03/1999 [facility]    Final  . HM Mammogram 07/18/2013 Normal exam   Final      Assessment/Plan  1. Essential hypertension -Patients  is stable; continue current regimen. Will monitor and make changes as necessary.  2. Gastroesophageal reflux disease without esophagitis -without symptoms as this time, will stop omeprazole   3. Other constipation - well controlled on current medication    4. B12 deficiency conts on b12 supplement   5. Hypothyroidism, unspecified hypothyroidism type - continues synthroid, will follow up TSH  6. Lichen -staff to monitor  -will stop weekly diflucan   7. Anemia, unspecified anemia type -will follow up cbc  8. Hyperlipidemia Daughter does not wish for mother to be on statin, but would like routine testing of lipid -will follow up lipids    9. Depression -had depression in the past due to moving into assisted living. This has now resolved. Was placed on remeron due to appetite and depression both of which have improved therefore will dc Remeron   10. Rash -improved with prednisone   11. Allergic Rhinitis -will dc Claritin, without symptoms

## 2014-06-17 DIAGNOSIS — D649 Anemia, unspecified: Secondary | ICD-10-CM | POA: Diagnosis not present

## 2014-06-17 DIAGNOSIS — R5381 Other malaise: Secondary | ICD-10-CM | POA: Diagnosis not present

## 2014-06-17 DIAGNOSIS — E559 Vitamin D deficiency, unspecified: Secondary | ICD-10-CM | POA: Diagnosis not present

## 2014-06-17 DIAGNOSIS — E785 Hyperlipidemia, unspecified: Secondary | ICD-10-CM | POA: Diagnosis not present

## 2014-06-17 DIAGNOSIS — E039 Hypothyroidism, unspecified: Secondary | ICD-10-CM | POA: Diagnosis not present

## 2014-06-22 DIAGNOSIS — R42 Dizziness and giddiness: Secondary | ICD-10-CM | POA: Diagnosis not present

## 2014-06-22 DIAGNOSIS — R260 Ataxic gait: Secondary | ICD-10-CM | POA: Diagnosis not present

## 2014-06-24 DIAGNOSIS — R260 Ataxic gait: Secondary | ICD-10-CM | POA: Diagnosis not present

## 2014-06-24 DIAGNOSIS — R42 Dizziness and giddiness: Secondary | ICD-10-CM | POA: Diagnosis not present

## 2014-06-25 DIAGNOSIS — R260 Ataxic gait: Secondary | ICD-10-CM | POA: Diagnosis not present

## 2014-06-25 DIAGNOSIS — R42 Dizziness and giddiness: Secondary | ICD-10-CM | POA: Diagnosis not present

## 2014-06-29 DIAGNOSIS — R260 Ataxic gait: Secondary | ICD-10-CM | POA: Diagnosis not present

## 2014-06-29 DIAGNOSIS — R42 Dizziness and giddiness: Secondary | ICD-10-CM | POA: Diagnosis not present

## 2014-07-20 ENCOUNTER — Other Ambulatory Visit: Payer: Self-pay

## 2014-07-20 DIAGNOSIS — Z1231 Encounter for screening mammogram for malignant neoplasm of breast: Secondary | ICD-10-CM

## 2014-08-10 DIAGNOSIS — M79672 Pain in left foot: Secondary | ICD-10-CM | POA: Diagnosis not present

## 2014-08-10 DIAGNOSIS — L89892 Pressure ulcer of other site, stage 2: Secondary | ICD-10-CM | POA: Diagnosis not present

## 2014-08-10 DIAGNOSIS — M79671 Pain in right foot: Secondary | ICD-10-CM | POA: Diagnosis not present

## 2014-08-10 DIAGNOSIS — B351 Tinea unguium: Secondary | ICD-10-CM | POA: Diagnosis not present

## 2014-08-19 ENCOUNTER — Ambulatory Visit
Admission: RE | Admit: 2014-08-19 | Discharge: 2014-08-19 | Disposition: A | Discharge status: Home / Self Care | Payer: Medicare Other | Source: Ambulatory Visit

## 2014-08-19 DIAGNOSIS — Z1231 Encounter for screening mammogram for malignant neoplasm of breast: Secondary | ICD-10-CM

## 2014-08-23 ENCOUNTER — Encounter: Payer: Self-pay | Admitting: Internal Medicine

## 2014-09-15 ENCOUNTER — Encounter: Payer: Self-pay | Admitting: Nurse Practitioner

## 2014-09-15 ENCOUNTER — Non-Acute Institutional Stay: Payer: Medicare Other | Admitting: Nurse Practitioner

## 2014-09-15 VITALS — BP 122/62 | HR 76 | Wt 149.0 lb

## 2014-09-15 DIAGNOSIS — E785 Hyperlipidemia, unspecified: Secondary | ICD-10-CM | POA: Diagnosis not present

## 2014-09-15 DIAGNOSIS — E039 Hypothyroidism, unspecified: Secondary | ICD-10-CM | POA: Diagnosis not present

## 2014-09-15 DIAGNOSIS — F329 Major depressive disorder, single episode, unspecified: Secondary | ICD-10-CM | POA: Diagnosis not present

## 2014-09-15 DIAGNOSIS — L299 Pruritus, unspecified: Secondary | ICD-10-CM | POA: Diagnosis not present

## 2014-09-15 DIAGNOSIS — K5909 Other constipation: Secondary | ICD-10-CM | POA: Diagnosis not present

## 2014-09-15 DIAGNOSIS — I10 Essential (primary) hypertension: Secondary | ICD-10-CM

## 2014-09-15 DIAGNOSIS — F32A Depression, unspecified: Secondary | ICD-10-CM

## 2014-09-15 DIAGNOSIS — G3184 Mild cognitive impairment, so stated: Secondary | ICD-10-CM | POA: Diagnosis not present

## 2014-09-15 NOTE — Progress Notes (Signed)
Patient ID: Lorraine [name], female   DOB: 1927-08-01, 81 y.o.   MRN: [id number]    Location:  [facility] (12)    Allergies  Allergen Reactions  . Codeine     Unknown   . Erythromycin     Unknown     Chief Complaint  Patient presents with  . Medical Management of Chronic Issues    3 month check blood pressure, thyroid, anemia, depression    HPI:  81 year old female being seen today at for routine follow up; had multiple changes at last visit   Last month was taken on omeprazole, not currently having any symptoms of GERD No problems with constipation, conts taking miralax daily   Hx of Lichen, diflucan was stopped and she remains without symptoms   Remeron was also stopped, mood has been stable, sleeping well at night and has good appetite but not eating as much as she used to.     Reports she is itchy all over. No rash noted. Was taking off Claritin at last visit   Medications: Patient's Medications  New Prescriptions   No medications on file  Previous Medications   ACETAMINOPHEN (TYLENOL) 325 MG TABLET    Take 650 mg by mouth. Take two tablets twice daily to reduce back pain   CALCIUM CARBONATE-VITAMIN D 600-400 MG-UNIT PER CHEW TABLET    Chew 1 tablet by mouth 2 (two) times daily.   CLOBETASOL OINTMENT (TEMOVATE) 0.05 %    Apply 1 application topically 2 (two) times daily. Apply to affected areas of labia and perineum BID for 2 weeks   CYANOCOBALAMIN 1000 MCG TABLET    Take 100 mcg by mouth daily.   LEVOTHYROXINE (SYNTHROID, LEVOTHROID) 50 MCG TABLET    Take 50 mcg by mouth daily.   METOPROLOL SUCCINATE (TOPROL-XL) 25 MG 24 HR TABLET    Take 25 mg by mouth daily.   MULTIVITAMIN-IRON-MINERALS-FOLIC ACID (CENTRUM) CHEWABLE TABLET    Chew 1 tablet by mouth daily.   NUTRITIONAL SUPPLEMENTS (ENSURE PO)    Take by mouth. Drink one can daily   OMEGA-3 FATTY ACIDS (FISH OIL) 1000 MG CPDR    Take 1,000 mg by mouth daily.   POLYETHYLENE GLYCOL POWDER (GLYCOLAX/MIRALAX) POWDER    Take 17 g by mouth daily.  Modified Medications   No medications on file  Discontinued Medications   ASPIRIN 81 MG TABLET    Take 81 mg by mouth daily.   CONJUGATED ESTROGENS (PREMARIN) VAGINAL CREAM    Place 1 Applicatorful vaginally daily. 1 gr to external perineum and vaginal introitus on Sunday, Wednesday and Friday     Review of Systems  Constitutional: Negative for fever, diaphoresis, activity change, appetite change, fatigue and unexpected weight change.  HENT: Positive for hearing loss. Negative for sinus pressure and sore throat.   Eyes: Positive for visual disturbance (corrective lenses).  Respiratory: Negative for cough, wheezing and stridor.   Cardiovascular: Negative for chest pain, palpitations and leg swelling.  Gastrointestinal: Negative for nausea, abdominal pain, diarrhea, constipation and blood in stool.  Endocrine: Negative.   Genitourinary: Negative for difficulty urinating.  Musculoskeletal: Positive for gait problem. Negative for myalgias, back pain, joint swelling, arthralgias and neck pain.  Skin: Negative for pallor and wound.       Hx of lichen sclerosis of the labia Reports itching all over, no rash  Allergic/Immunologic: Negative.   Neurological: Negative.  Negative for dizziness, light-headedness and headaches.  Hematological:  Negative.   Psychiatric/Behavioral: Positive for confusion.    Filed Vitals:   09/15/14 1336  BP: 122/62  Pulse: 76  Weight: 149 lb (67.586 kg)   Body mass index is 24.79 kg/(m^2).  Physical Exam  Constitutional: She is oriented to person, place, and time. She appears well-developed and well-nourished. No distress.  HENT:  Head: Normocephalic and atraumatic.  Eyes: Conjunctivae and EOM are normal. Pupils are equal, round, and reactive to light.  Neck: Normal range of motion. Neck supple. No JVD present. No thyromegaly present.  Cardiovascular: Normal rate, regular  rhythm and normal heart sounds.   Pulmonary/Chest: Effort normal and breath sounds normal. No respiratory distress.  Abdominal: Soft. Bowel sounds are normal. She exhibits no distension. There is no tenderness.  Musculoskeletal: Normal range of motion. She exhibits no edema or tenderness.  Neurological: She is alert and oriented to person, place, and time.  Skin: Skin is warm and dry. No rash noted. No erythema.  Areas of excoriation from scratching   Psychiatric: She has a normal mood and affect.     Labs reviewed: Labs reviewed: Basic Metabolic Panel: No results for input(s): NA, K, CL, CO2, GLUCOSE, BUN, CREATININE, CALCIUM, MG, PHOS in the last 8760 hours. Liver Function Tests: No results for input(s): AST, ALT, ALKPHOS, BILITOT, PROT, ALBUMIN in the last 8760 hours. No results for input(s): LIPASE, AMYLASE in the last 8760 hours. No results for input(s): AMMONIA in the last 8760 hours. CBC: No results for input(s): WBC, NEUTROABS, HGB, HCT, MCV, PLT in the last 8760 hours. CBG: No results for input(s): GLUCAP in the last 8760 hours. TSH: No results for input(s): TSH in the last 8760 hours. A1C: Lab Results  Component Value Date   HGBA1C 5.1 07/08/2012   Lipid Panel: No results for input(s): CHOL, HDL, LDLCALC, TRIG, CHOLHDL, LDLDIRECT in the last 8760 hours.    Assessment/Plan  1. Essential hypertension Controlled on current regimen  2. Mild cognitive impairment with memory loss -unchanged   3. Depression -denies any depression at this time, conts off remeron   4. Hypothyroidism, unspecified hypothyroidism type conts on synthroid 50 mcgs, will follow up tsh  5. Other constipation Controlled on miralax  6. Hyperlipidemia -remains on fish oil, will follow up fasting lipids   7. Pruritus Claritin was stopped at last visit, will add this back to see if this provides any relief

## 2014-09-16 DIAGNOSIS — E559 Vitamin D deficiency, unspecified: Secondary | ICD-10-CM | POA: Diagnosis not present

## 2014-09-16 DIAGNOSIS — D649 Anemia, unspecified: Secondary | ICD-10-CM | POA: Diagnosis not present

## 2014-09-16 DIAGNOSIS — R5381 Other malaise: Secondary | ICD-10-CM | POA: Diagnosis not present

## 2014-09-16 DIAGNOSIS — E039 Hypothyroidism, unspecified: Secondary | ICD-10-CM | POA: Diagnosis not present

## 2014-09-16 DIAGNOSIS — E785 Hyperlipidemia, unspecified: Secondary | ICD-10-CM | POA: Diagnosis not present

## 2014-09-16 LAB — HEPATIC FUNCTION PANEL
ALT: 14 U/L (ref 7–35)
AST: 21 U/L (ref 13–35)
Alkaline Phosphatase: 44 U/L (ref 25–125)
Bilirubin, Total: 9 mg/dL

## 2014-09-16 LAB — LIPID PANEL
Cholesterol: 175 mg/dL (ref 0–200)
HDL: 43 mg/dL (ref 35–70)
LDL Cholesterol: 82 mg/dL
TRIGLYCERIDES: 249 mg/dL — AB (ref 40–160)

## 2014-09-16 LAB — BASIC METABOLIC PANEL
BUN: 21 mg/dL (ref 4–21)
CREATININE: 1.1 mg/dL (ref 0.5–1.1)
Glucose: 87 mg/dL
Potassium: 3.9 mmol/L (ref 3.4–5.3)
SODIUM: 138 mmol/L (ref 137–147)

## 2014-09-16 LAB — TSH: TSH: 5.15 u[IU]/mL (ref 0.41–5.90)

## 2014-09-16 LAB — CBC AND DIFFERENTIAL
HEMATOCRIT: 38 % (ref 36–46)
HEMOGLOBIN: 12.6 g/dL (ref 12.0–16.0)
Platelets: 138 10*3/uL — AB (ref 150–399)
WBC: 5 10^3/mL

## 2014-10-06 ENCOUNTER — Non-Acute Institutional Stay: Payer: Medicare Other | Admitting: Nurse Practitioner

## 2014-10-06 ENCOUNTER — Encounter: Payer: Self-pay | Admitting: Nurse Practitioner

## 2014-10-06 VITALS — BP 120/62 | HR 68 | Temp 97.6°F | Wt 147.0 lb

## 2014-10-06 DIAGNOSIS — B373 Candidiasis of vulva and vagina: Secondary | ICD-10-CM

## 2014-10-06 DIAGNOSIS — L299 Pruritus, unspecified: Secondary | ICD-10-CM

## 2014-10-06 DIAGNOSIS — B3731 Acute candidiasis of vulva and vagina: Secondary | ICD-10-CM

## 2014-10-06 NOTE — Progress Notes (Signed)
Patient ID: Lorraine [name], female   DOB: 09-Feb-1927, 81 y.o.   MRN: [id number]    Location:  [facility] (12)    Allergies  Allergen Reactions  . Codeine     Unknown   . Erythromycin     Unknown     Chief Complaint  Patient presents with  . Pruritis    on chest, back and arms. Patient takes whilpool baths twice a week, they use their soak, she thinks maybe this is causing the itching.  . Vaginal Itching    per AL nurse, has increase redness and itching to vaginal area. has current order for Premarin cream 3 time weekly. Per patient occasionaly itchs, she scratchs and it gets red.    HPI:  [name] is a 81 year old female who presents today with chief complaint of pruritis of the chest, back and arms.  Patient is also complaining of vaginal itching.    1. Pruritis - First noticed 2 months ago. She thinks it is due to her whirlpool baths twice per week and is concerned the soap is drying out her skin.  She has not taken anything over the counter for this. Claritin was restarted at last visit, unsure if this has been effective.  Her right chest is the area with the most irritation and itching.  2. Vaginal itching - Describes the itching as mild and does not bother her most days.  She says that she itches every day but not all day long.  She has a history of vaginal itching and is followed by gynecology for lichen sclerosis .     Medications: Patient's Medications  New Prescriptions   No medications on file  Previous Medications   ACETAMINOPHEN (TYLENOL) 325 MG TABLET    Take 650 mg by mouth. Take two tablets twice daily to reduce back pain   CALCIUM CARBONATE-VITAMIN D 600-400 MG-UNIT PER CHEW TABLET    Chew 1 tablet by mouth 2 (two) times daily.   CLOBETASOL OINTMENT (TEMOVATE) 0.05 %    Apply 1 application topically 2 (two) times daily. Apply to affected areas of labia and perineum BID for 2 weeks   CYANOCOBALAMIN 1000 MCG TABLET     Take 100 mcg by mouth daily.   LEVOTHYROXINE (SYNTHROID, LEVOTHROID) 50 MCG TABLET    Take 50 mcg by mouth daily.   METOPROLOL SUCCINATE (TOPROL-XL) 25 MG 24 HR TABLET    Take 25 mg by mouth daily.   MULTIVITAMIN-IRON-MINERALS-FOLIC ACID (CENTRUM) CHEWABLE TABLET    Chew 1 tablet by mouth daily.   NUTRITIONAL SUPPLEMENTS (ENSURE PO)    Take by mouth. Drink one can daily   OMEGA-3 FATTY ACIDS (FISH OIL) 1000 MG CPDR    Take 1,000 mg by mouth daily.   POLYETHYLENE GLYCOL POWDER (GLYCOLAX/MIRALAX) POWDER    Take 17 g by mouth daily.  Modified Medications   No medications on file  Discontinued Medications   No medications on file     Review of Systems  Constitutional: Negative for fever, diaphoresis, activity change, appetite change, fatigue and unexpected weight change.  Eyes: Visual disturbance: corrective lenses.  Respiratory: Negative for cough and wheezing.   Cardiovascular: Negative for chest pain, palpitations and leg swelling.  Endocrine: Negative.   Genitourinary: Negative for flank pain, vaginal bleeding, vaginal discharge, difficulty urinating and vaginal pain.  Skin: Negative for pallor and wound.       Hx of lichen sclerosis of the labia  Neurological: Negative for dizziness, light-headedness and headaches.    Filed Vitals:   10/06/14 1434  BP: 120/62  Pulse: 68  Temp: 97.6 F (36.4 C)  TempSrc: Oral  Weight: 147 lb (66.679 kg)   Body mass index is 24.46 kg/(m^2).  Physical Exam  Constitutional: She is oriented to person, place, and time. She appears well-developed and well-nourished. No distress.  HENT:  Head: Normocephalic and atraumatic.  Neck: Normal range of motion. Neck supple.  Cardiovascular: Normal rate, regular rhythm and normal heart sounds.   Pulmonary/Chest: Effort normal and breath sounds normal. No respiratory distress.  Abdominal: She exhibits no distension. There is no tenderness.  Genitourinary: There is rash on the right labia. There is rash  on the left labia.  Labia are erythematous.  Yellow and white discharge present on outside of vaginal canal.   Musculoskeletal: Normal range of motion. She exhibits no edema or tenderness.  Neurological: She is alert and oriented to person, place, and time.  Skin: Skin is warm and dry. No rash noted. No erythema.  Areas of excoriation from scratching on right chest .  Psychiatric: She has a normal mood and affect.     Labs reviewed: Labs reviewed: Basic Metabolic Panel: No results for input(s): NA, K, CL, CO2, GLUCOSE, BUN, CREATININE, CALCIUM, MG, PHOS in the last 8760 hours. Liver Function Tests: No results for input(s): AST, ALT, ALKPHOS, BILITOT, PROT, ALBUMIN in the last 8760 hours. No results for input(s): LIPASE, AMYLASE in the last 8760 hours. No results for input(s): AMMONIA in the last 8760 hours. CBC: No results for input(s): WBC, NEUTROABS, HGB, HCT, MCV, PLT in the last 8760 hours. CBG: No results for input(s): GLUCAP in the last 8760 hours. TSH: No results for input(s): TSH in the last 8760 hours. A1C: Lab Results  Component Value Date   HGBA1C 5.1 07/08/2012   Lipid Panel: No results for input(s): CHOL, HDL, LDLCALC, TRIG, CHOLHDL, LDLDIRECT in the last 8760 hours.    Assessment/Plan  1. Pruitis -  Scattered rash with minimal lesions on chest. Will rx for 1% hydrocortisone cream bid as needed to chest for itching.  To use dove soap only in whirlpool. No lotions or soap with fragrances or additives   2. Candidal Vulvovaginits - Pt with hx of lichen sclerosis, previously using diflucan weekly but this was stopped several months ago. Will Rx for diflucan 150 mg times one.    Follow up with clinic if symptoms worsen.

## 2014-10-12 DIAGNOSIS — B351 Tinea unguium: Secondary | ICD-10-CM | POA: Diagnosis not present

## 2014-10-12 DIAGNOSIS — L89892 Pressure ulcer of other site, stage 2: Secondary | ICD-10-CM | POA: Diagnosis not present

## 2014-10-12 DIAGNOSIS — M79671 Pain in right foot: Secondary | ICD-10-CM | POA: Diagnosis not present

## 2014-10-12 DIAGNOSIS — M79672 Pain in left foot: Secondary | ICD-10-CM | POA: Diagnosis not present

## 2014-10-13 ENCOUNTER — Encounter: Payer: Self-pay | Admitting: Nurse Practitioner

## 2015-01-11 DIAGNOSIS — L89892 Pressure ulcer of other site, stage 2: Secondary | ICD-10-CM | POA: Diagnosis not present

## 2015-01-11 DIAGNOSIS — E039 Hypothyroidism, unspecified: Secondary | ICD-10-CM | POA: Diagnosis not present

## 2015-01-11 DIAGNOSIS — B351 Tinea unguium: Secondary | ICD-10-CM | POA: Diagnosis not present

## 2015-01-11 DIAGNOSIS — M79671 Pain in right foot: Secondary | ICD-10-CM | POA: Diagnosis not present

## 2015-01-11 DIAGNOSIS — M79672 Pain in left foot: Secondary | ICD-10-CM | POA: Diagnosis not present

## 2015-01-11 LAB — TSH: TSH: 1.91 u[IU]/mL (ref 0.41–5.90)

## 2015-01-19 ENCOUNTER — Encounter: Payer: Self-pay | Admitting: Nurse Practitioner

## 2015-01-19 ENCOUNTER — Non-Acute Institutional Stay: Payer: Medicare Other | Admitting: Nurse Practitioner

## 2015-01-19 VITALS — BP 112/62 | HR 72 | Temp 97.4°F | Ht 65.0 in | Wt 145.0 lb

## 2015-01-19 DIAGNOSIS — K5909 Other constipation: Secondary | ICD-10-CM

## 2015-01-19 DIAGNOSIS — E119 Type 2 diabetes mellitus without complications: Secondary | ICD-10-CM

## 2015-01-19 DIAGNOSIS — I10 Essential (primary) hypertension: Secondary | ICD-10-CM

## 2015-01-19 DIAGNOSIS — E088 Diabetes mellitus due to underlying condition with unspecified complications: Secondary | ICD-10-CM | POA: Diagnosis not present

## 2015-01-19 DIAGNOSIS — L280 Lichen simplex chronicus: Secondary | ICD-10-CM | POA: Diagnosis not present

## 2015-01-19 DIAGNOSIS — E538 Deficiency of other specified B group vitamins: Secondary | ICD-10-CM | POA: Diagnosis not present

## 2015-01-19 DIAGNOSIS — Z299 Encounter for prophylactic measures, unspecified: Secondary | ICD-10-CM

## 2015-01-19 DIAGNOSIS — Z418 Encounter for other procedures for purposes other than remedying health state: Secondary | ICD-10-CM

## 2015-01-19 DIAGNOSIS — K219 Gastro-esophageal reflux disease without esophagitis: Secondary | ICD-10-CM

## 2015-01-19 DIAGNOSIS — G3184 Mild cognitive impairment, so stated: Secondary | ICD-10-CM | POA: Diagnosis not present

## 2015-01-19 DIAGNOSIS — E039 Hypothyroidism, unspecified: Secondary | ICD-10-CM

## 2015-01-19 NOTE — Progress Notes (Signed)
Patient ID: Lorraine [name], female   DOB: [birth year], 81 y.o.   MRN: [id number]    Location:  [facility] (12)    Allergies  Allergen Reactions  . Codeine     Unknown   . Erythromycin     Unknown     Chief Complaint  Patient presents with  . Annual Exam    Comprenhsive exam: blood sugar, thyroid, blood pressure, depression  . Constipation    per nurses note past couple of days report having difficult time getting stool out    HPI:  81 y.o. female seen today at [facility] for extended visit. Pt currently living in assisted living at [facility] and has done well in the last year there has been no hospitalizations or major illness.  Functional Status of ADLs: Toileting- some episodes of incontinence otherwise able to toilet with minimal to no assist  Bathing-extended assistance Dressing-needs assistance   Pt with ongoing itching to chest and arms at times, otherwise without complaints  Medications: Patient's Medications  New Prescriptions   No medications on file  Previous Medications   ACETAMINOPHEN (TYLENOL) 325 MG TABLET    Take 650 mg by mouth. Take two tablets twice daily to reduce back pain   CALCIUM CARBONATE-VITAMIN D 600-400 MG-UNIT PER CHEW TABLET    Chew 1 tablet by mouth 2 (two) times daily.   CLOBETASOL OINTMENT (TEMOVATE) 0.05 %    Apply 1 application topically 2 (two) times daily. Apply to affected areas of labia and perineum BID for 2 weeks   CYANOCOBALAMIN 1000 MCG TABLET    Take 100 mcg by mouth daily.   HYDROCORTISONE CREAM 1 %    Apply 1 application topically 2 (two) times daily. To chest as needed   LORATADINE (CLARITIN) 10 MG TABLET    Take 10 mg by mouth daily. Take one daily for itching   METOPROLOL SUCCINATE (TOPROL-XL) 25 MG 24 HR TABLET    Take 25 mg by mouth daily.   MULTIVITAMIN-IRON-MINERALS-FOLIC ACID (CENTRUM) CHEWABLE TABLET    Chew 1 tablet by mouth daily.   NUTRITIONAL SUPPLEMENTS  (ENSURE PO)    Take by mouth. Drink one can M-W-F   OMEGA-3 FATTY ACIDS (FISH OIL) 1000 MG CPDR    Take 1,000 mg by mouth daily.   POLYETHYLENE GLYCOL POWDER (GLYCOLAX/MIRALAX) POWDER    Take 17 g by mouth daily.   PREMARIN VAGINAL CREAM    1 gram to external ad perineum and vaginal introitus on Sunday, Wednesday and Friday  Modified Medications   No medications on file  Discontinued Medications   LEVOTHYROXINE (SYNTHROID, LEVOTHROID) 50 MCG TABLET    Take 50 mcg by mouth daily.   LEVOTHYROXINE (SYNTHROID, LEVOTHROID) 75 MCG TABLET    Take one tablet daily for thyroid     Review of Systems  Constitutional: Negative for fever, diaphoresis, activity change, appetite change, fatigue and unexpected weight change.  Eyes: Visual disturbance: corrective lenses.  Respiratory: Negative for cough and wheezing.   Cardiovascular: Negative for chest pain, palpitations and leg swelling.  Endocrine: Negative.   Genitourinary: Negative for flank pain, vaginal bleeding, vaginal discharge, difficulty urinating and vaginal pain.  Skin: Negative for pallor and wound.       Hx of lichen sclerosis of the labia   Neurological: Negative for dizziness, light-headedness and headaches.    Filed Vitals:   01/19/15 1011  BP: 112/62  Pulse: 72  Temp: 97.4 F (36.3 C)  TempSrc:  Oral  Height: 5\' 5"  (1.651 m)  Weight: 145 lb (65.772 kg)  SpO2: 96%   Body mass index is 24.13 kg/(m^2).  Physical Exam  Constitutional: She is oriented to person, place, and time. She appears well-developed and well-nourished. No distress.  HENT:  Head: Normocephalic and atraumatic.  Neck: Normal range of motion. Neck supple.  Cardiovascular: Normal rate, regular rhythm and normal heart sounds.   Pulmonary/Chest: Effort normal and breath sounds normal. No respiratory distress.  Abdominal: She exhibits no distension. There is no tenderness.  Genitourinary: There is rash on the right labia. There is rash on the left labia.    Labia are erythematous.  Yellow and white discharge present on outside of vaginal canal.   Musculoskeletal: Normal range of motion. She exhibits no edema or tenderness.  Neurological: She is alert and oriented to person, place, and time.  Skin: Skin is warm and dry. No rash noted. No erythema.  Areas of excoriation from scratching on right chest .  Psychiatric: She has a normal mood and affect.     Labs reviewed:  Basic Metabolic Panel:  Recent Labs  09/16/14  NA 138  K 3.9  BUN 21  CREATININE 1.1   Liver Function Tests:  Recent Labs  09/16/14  AST 21  ALT 14  ALKPHOS 44   No results for input(s): LIPASE, AMYLASE in the last 8760 hours. No results for input(s): AMMONIA in the last 8760 hours. CBC:  Recent Labs  09/16/14  WBC 5.0  HGB 12.6  HCT 38  PLT 138*   CBG: No results for input(s): GLUCAP in the last 8760 hours. TSH:  Recent Labs  09/16/14 01/11/15  TSH 5.15 1.91   A1C: Lab Results  Component Value Date   HGBA1C 5.1 07/08/2012   Lipid Panel:  Recent Labs  09/16/14  CHOL 175  HDL 43  LDLCALC 82  TRIG 249*      Assessment/Plan  1. Preventive measure -pt doing well in current AL setting. Needing some assistance with ADLs but still very independent.  - DNR (Do Not Resuscitate)  2. Essential hypertension -blood pressure stable, conts on metoprolol  3. Other constipation Worsening constipation at time, will add colace 100 mg daily to help with this  4. Gastroesophageal reflux disease without esophagitis Not on medications at this time, without any complaints of GERD  5. B12 deficiency -conts on b12 supplement, hgb has been stable   6. Hypothyroidism, unspecified hypothyroidism type Recent TSH  Which is now therapeutic, will cont synthroid 75 mcg   7. Type 2 diabetes mellitus without complication Not on any medications, will follow up a1C  8. Mild cognitive impairment with memory loss Minimal decline in the last year, MMSE from  23/30 to 21/30, needing increased assistance with dressing.   9. Lichen -has been stable, conts premarin vaginal cream with good effects.   Follow up for routine visit in 4 month with blood work prior to visit

## 2015-02-11 ENCOUNTER — Encounter: Payer: Self-pay | Admitting: Internal Medicine

## 2015-02-15 DIAGNOSIS — L89892 Pressure ulcer of other site, stage 2: Secondary | ICD-10-CM | POA: Diagnosis not present

## 2015-02-15 DIAGNOSIS — M79671 Pain in right foot: Secondary | ICD-10-CM | POA: Diagnosis not present

## 2015-02-15 DIAGNOSIS — M79672 Pain in left foot: Secondary | ICD-10-CM | POA: Diagnosis not present

## 2015-03-16 DIAGNOSIS — H35363 Drusen (degenerative) of macula, bilateral: Secondary | ICD-10-CM | POA: Diagnosis not present

## 2015-03-16 DIAGNOSIS — Z961 Presence of intraocular lens: Secondary | ICD-10-CM | POA: Diagnosis not present

## 2015-03-16 DIAGNOSIS — H26492 Other secondary cataract, left eye: Secondary | ICD-10-CM | POA: Diagnosis not present

## 2015-03-16 DIAGNOSIS — H43813 Vitreous degeneration, bilateral: Secondary | ICD-10-CM | POA: Diagnosis not present

## 2015-03-31 DIAGNOSIS — R4182 Altered mental status, unspecified: Secondary | ICD-10-CM | POA: Diagnosis not present

## 2015-04-19 DIAGNOSIS — M79671 Pain in right foot: Secondary | ICD-10-CM | POA: Diagnosis not present

## 2015-04-19 DIAGNOSIS — B351 Tinea unguium: Secondary | ICD-10-CM | POA: Diagnosis not present

## 2015-04-19 DIAGNOSIS — M79672 Pain in left foot: Secondary | ICD-10-CM | POA: Diagnosis not present

## 2015-04-19 DIAGNOSIS — L89892 Pressure ulcer of other site, stage 2: Secondary | ICD-10-CM | POA: Diagnosis not present

## 2015-05-19 DIAGNOSIS — G301 Alzheimer's disease with late onset: Secondary | ICD-10-CM | POA: Diagnosis not present

## 2015-05-19 DIAGNOSIS — E039 Hypothyroidism, unspecified: Secondary | ICD-10-CM | POA: Diagnosis not present

## 2015-05-19 DIAGNOSIS — R4182 Altered mental status, unspecified: Secondary | ICD-10-CM | POA: Diagnosis not present

## 2015-05-19 LAB — BASIC METABOLIC PANEL
BUN: 19 mg/dL (ref 4–21)
Creatinine: 1 mg/dL (ref 0.5–1.1)
Glucose: 80 mg/dL
Potassium: 3.9 mmol/L (ref 3.4–5.3)
Sodium: 136 mmol/L — AB (ref 137–147)

## 2015-05-19 LAB — CBC AND DIFFERENTIAL
HEMATOCRIT: 37 % (ref 36–46)
Hemoglobin: 12.7 g/dL (ref 12.0–16.0)
PLATELETS: 124 10*3/uL — AB (ref 150–399)
WBC: 4.7 10*3/mL

## 2015-05-19 LAB — HEPATIC FUNCTION PANEL
ALT: 12 U/L (ref 7–35)
AST: 22 U/L (ref 13–35)
Alkaline Phosphatase: 44 U/L (ref 25–125)
Bilirubin, Total: 0.6 mg/dL

## 2015-05-19 LAB — TSH: TSH: 4.6 u[IU]/mL (ref 0.41–5.90)

## 2015-05-25 ENCOUNTER — Non-Acute Institutional Stay: Payer: Medicare Other | Admitting: Internal Medicine

## 2015-05-25 ENCOUNTER — Encounter: Payer: Self-pay | Admitting: Internal Medicine

## 2015-05-25 VITALS — BP 112/68 | HR 72 | Temp 97.4°F | Wt 148.0 lb

## 2015-05-25 DIAGNOSIS — E119 Type 2 diabetes mellitus without complications: Secondary | ICD-10-CM | POA: Diagnosis not present

## 2015-05-25 DIAGNOSIS — G3184 Mild cognitive impairment, so stated: Secondary | ICD-10-CM

## 2015-05-25 DIAGNOSIS — E039 Hypothyroidism, unspecified: Secondary | ICD-10-CM | POA: Diagnosis not present

## 2015-05-25 DIAGNOSIS — I10 Essential (primary) hypertension: Secondary | ICD-10-CM

## 2015-05-25 DIAGNOSIS — E538 Deficiency of other specified B group vitamins: Secondary | ICD-10-CM

## 2015-05-25 DIAGNOSIS — J302 Other seasonal allergic rhinitis: Secondary | ICD-10-CM | POA: Diagnosis not present

## 2015-05-25 NOTE — Progress Notes (Signed)
Patient ID: Lorraine [name], female   DOB: 1927-04-11, 81 y.o.   MRN: [id number]   Location:  [facility]  Code Status: DNR  Goals of Care:Advanced Directive information Does patient have an advance directive?: Yes, Type of Advance Directive: [place];Living will;Out of facility DNR (pink MOST or yellow form), Pre-existing out of facility DNR order (yellow form or pink MOST form): Yellow form placed in chart (order not valid for inpatient use)  Chief Complaint  Patient presents with  . Medical Management of Chronic Issues    blood sugar, blood pressure, thyroid, depression. Here with daughter Dorian Pod    HPI: Patient is a 81 y.o. white female seen in the [facility] today for med mgt of chronic diseases.   Says she is doing fine.   TSH 4.6 wnl.  On synthroid 73mcg daily first thing in the morning.    Constipation:  miralax is working well.    Vision:  Still goes out to see Dr. Herbert Deaner for this.  Seeing well with her glasses.    Hearing well.  No hearing aides.  Low back pain: occasionally bothers her when she's up walking around.  Improves with rest and sitting.    Dysphagia:  No difficulty recently with coughing when eating.  Depression: In good spirits.  Content in her situation.  Says she's at the best place.  Sleeps well at night.  Memory:  Says it's naturally declining some with age.  A slight decline recently.  Some past memories are also slipping. Appetite is good per pt.  She is getting ensure and gets ice cream with it every other day.    MOST was done in AL.    Gait is shuffling.  She is doing ok walking with the walker.  Walks to visit Ms. Dodds in health care.  Otherwise does not regular walk.    Review of Systems:  Review of Systems  Constitutional: Negative for fever, chills and malaise/fatigue.  HENT: Negative for congestion and hearing loss.   Eyes:       Glasses  Respiratory: Negative for cough and shortness of breath.     Cardiovascular: Negative for chest pain.  Gastrointestinal: Positive for constipation. Negative for abdominal pain.       Decreased appetite; miralax effective for bowels  Genitourinary: Negative for dysuria.  Musculoskeletal: Positive for back pain. Negative for myalgias and falls.  Neurological: Negative for weakness.       Shuffling gait, uses walker  Psychiatric/Behavioral: Positive for depression and memory loss.    Past Medical History  Diagnosis Date  . Cataracts, bilateral   . Arthritis   . Skin cancer   . Diabetes mellitus without complication   . Other specified diseases of blood and blood-forming organs(289.89)     s/p Chelation treatment  . Ventral hernia, unspecified, without mention of obstruction or gangrene   . Diverticulosis of colon (without mention of hemorrhage)   . Diffuse cystic mastopathy 1997    s/p right breast bx, benign  . Digestive-genital tract fistula, female   . Other psoriasis   . Other lichen, not elsewhere classified 5916    Lichen sclerosis perineum/perianal  . Alopecia areata   . Lumbago 2001    lumbar radiculopathy L3, spondylosis L2-3, 3-4. dx Dr.Cram  . Dizziness and giddiness     benign positional vertigo  . Insomnia, unspecified   . Other malaise and fatigue   . Abnormality of gait 2008    re: metoclopramide.   Marland Kitchen  Dysphagia, pharyngoesophageal phase   . Closed fracture of lateral malleolus   . Hyperlipidemia   . Unspecified constipation   . Thyroid disease   . GERD (gastroesophageal reflux disease)   . B12 deficiency   . Anal fissure   . Hypertension   . Depression   . Mild cognitive impairment with memory loss 12/16/2013    Past Surgical History  Procedure Laterality Date  . Appendectomy/ovarian cystectomy  1950s  . Breast lumpectomy  1997  . Hernia repair    . Vaginal hysterectomy  1998  . Tonsillectomy and adenoidectomy  1942  . Cystectomy  1950's    brachial cleft  . Bunionectomy Bilateral 1975    twice  .  Cataract extraction w/ intraocular lens  implant, bilateral  1990  . Breast biopsy  1997    right benign  . Colectomy  11/2007    Sigmoid w/closure of fistula and repair of umbilical hernia    Social History:   reports that she quit smoking about 30 years ago. She has never used smokeless tobacco. She reports that she drinks alcohol. She reports that she does not use illicit drugs.  Allergies  Allergen Reactions  . Codeine     Unknown   . Erythromycin     Unknown     Medications: Patient's Medications  New Prescriptions   No medications on file  Previous Medications   ACETAMINOPHEN (TYLENOL) 325 MG TABLET    Take 650 mg by mouth. Take two tablets twice daily as needed to reduce back pain    CALCIUM CARBONATE-VITAMIN D 600-400 MG-UNIT PER CHEW TABLET    Chew 1 tablet by mouth 2 (two) times daily.   CYANOCOBALAMIN 1000 MCG TABLET    Take 100 mcg by mouth daily.   HYDROCORTISONE CREAM 1 %    Apply 1 application topically 2 (two) times daily. To chest as needed   LEVOTHYROXINE (SYNTHROID, LEVOTHROID) 75 MCG TABLET    Take 75 mcg by mouth daily before breakfast.   METOPROLOL SUCCINATE (TOPROL-XL) 25 MG 24 HR TABLET    Take 25 mg by mouth daily.   MULTIVITAMIN-IRON-MINERALS-FOLIC ACID (CENTRUM) CHEWABLE TABLET    Chew 1 tablet by mouth daily.   NUTRITIONAL SUPPLEMENTS (ENSURE PO)    Take by mouth. Drink one can M-W-F   OMEGA-3 FATTY ACIDS (FISH OIL) 1000 MG CPDR    Take 1,000 mg by mouth daily.   POLYETHYLENE GLYCOL POWDER (GLYCOLAX/MIRALAX) POWDER    Take 17 g by mouth daily.   PREMARIN VAGINAL CREAM    1 gram to external ad perineum and vaginal introitus on Sunday, Wednesday and Friday  Modified Medications   No medications on file  Discontinued Medications   CLOBETASOL OINTMENT (TEMOVATE) 0.05 %    Apply 1 application topically 2 (two) times daily. Apply to affected areas of labia and perineum BID for 2 weeks   LORATADINE (CLARITIN) 10 MG TABLET    Take 10 mg by mouth daily. Take  one daily for itching     Physical Exam: Filed Vitals:   05/25/15 1327  BP: 112/68  Pulse: 72  Temp: 97.4 F (36.3 C)  TempSrc: Oral  Weight: 148 lb (67.132 kg)  SpO2: 91%   Body mass index is 24.63 kg/(m^2). Physical Exam  Constitutional: She appears well-developed and well-nourished. No distress.  Eyes:  glasses  Cardiovascular: Normal rate, regular rhythm, normal heart sounds and intact distal pulses.   Pulmonary/Chest: Effort normal and breath sounds normal. No respiratory distress.  Abdominal: Soft. Bowel sounds are normal.  Musculoskeletal: Normal range of motion. She exhibits no edema or tenderness.  Walks with walker  Neurological: She is alert.  Oriented to person; shuffling gait  Psychiatric: She has a normal mood and affect.   Labs reviewed: Basic Metabolic Panel:  Recent Labs  09/16/14 01/11/15 05/19/15  NA 138  --  136*  K 3.9  --  3.9  BUN 21  --  19  CREATININE 1.1  --  1.0  TSH 5.15 1.91 4.60   Liver Function Tests:  Recent Labs  09/16/14 05/19/15  AST 21 22  ALT 14 12  ALKPHOS 44 44   No results for input(s): LIPASE, AMYLASE in the last 8760 hours. No results for input(s): AMMONIA in the last 8760 hours. CBC:  Recent Labs  09/16/14 05/19/15  WBC 5.0 4.7  HGB 12.6 12.7  HCT 38 37  PLT 138* 124*   Lipid Panel:  Recent Labs  09/16/14  CHOL 175  HDL 43  LDLCALC 82  TRIG 249*   Lab Results  Component Value Date   HGBA1C 5.1 07/08/2012    Patient Care Team: Gayland Curry, DO as PCP - General (Geriatric Medicine) Mardene Celeste, NP as Nurse Practitioner (Geriatric Medicine) Neldon Mc, MD as Consulting Physician (General Surgery) Particia Nearing, MD as Consulting Physician (Dermatology) Monna Fam, MD as Consulting Physician (Ophthalmology) Garlan Fair, MD as Consulting Physician (Gastroenterology) Kary Kos, MD as Consulting Physician (Neurosurgery) Elsie Saas, MD as Consulting Physician (Orthopedic  Surgery) Selinda Orion, MD as Consulting Physician (Obstetrics and Gynecology) [facility] Rolm Bookbinder, MD as Consulting Physician (Dermatology)  Assessment/Plan 1. Hypothyroidism, unspecified hypothyroidism type -cont current synthroid -f/u tsh    2. B12 deficiency -cont oral b12 -f/u b12 levels--her daughter gets injections and wonders if her mother absorbs the pills--will assess   3. Type 2 diabetes mellitus without complication -has been well controlled with diet only  -has a small appetite--getting ensure and ensure with ice cream  4. Mild cognitive impairment with memory loss -seems she has dementia at this point--requiring AL care--meds are provided -f/u b12 and tsh to make sure these two issues are controlled  5. Essential hypertension -bp well controlled with metoprolol only  6. Seasonal allergies -not currently symptomatic and her daughter requested we stop her claritin  Add vitamin D 2000 units daily for bone strength, balance  Labs/tests ordered:  Tsh, b12, hba1c Next appt:  6 mos med mgt  Charmel Pronovost L. Shley Dolby, D.O. [facility] [address], [place] [address] Cell Phone (Mon-Fri 8am-5pm):  [phone] On Call:  [phone] & follow prompts after 5pm & weekends Office Phone:  [phone] Office Fax:  [phone]

## 2015-06-08 DIAGNOSIS — Z23 Encounter for immunization: Secondary | ICD-10-CM | POA: Diagnosis not present

## 2015-06-21 DIAGNOSIS — L89892 Pressure ulcer of other site, stage 2: Secondary | ICD-10-CM | POA: Diagnosis not present

## 2015-06-21 DIAGNOSIS — M79672 Pain in left foot: Secondary | ICD-10-CM | POA: Diagnosis not present

## 2015-06-21 DIAGNOSIS — B351 Tinea unguium: Secondary | ICD-10-CM | POA: Diagnosis not present

## 2015-06-21 DIAGNOSIS — M79671 Pain in right foot: Secondary | ICD-10-CM | POA: Diagnosis not present

## 2015-06-30 DIAGNOSIS — E08621 Diabetes mellitus due to underlying condition with foot ulcer: Secondary | ICD-10-CM | POA: Diagnosis not present

## 2015-06-30 DIAGNOSIS — D513 Other dietary vitamin B12 deficiency anemia: Secondary | ICD-10-CM | POA: Diagnosis not present

## 2015-06-30 DIAGNOSIS — E039 Hypothyroidism, unspecified: Secondary | ICD-10-CM | POA: Diagnosis not present

## 2015-06-30 LAB — TSH: TSH: 0.16 u[IU]/mL — AB (ref <=5.90)

## 2015-08-05 ENCOUNTER — Non-Acute Institutional Stay: Payer: Medicare Other | Admitting: Adult Health

## 2015-08-05 ENCOUNTER — Encounter: Payer: Self-pay | Admitting: Adult Health

## 2015-08-05 DIAGNOSIS — I10 Essential (primary) hypertension: Secondary | ICD-10-CM

## 2015-08-05 DIAGNOSIS — E860 Dehydration: Secondary | ICD-10-CM | POA: Diagnosis not present

## 2015-08-05 DIAGNOSIS — I951 Orthostatic hypotension: Secondary | ICD-10-CM | POA: Diagnosis not present

## 2015-08-05 DIAGNOSIS — R5382 Chronic fatigue, unspecified: Secondary | ICD-10-CM | POA: Diagnosis not present

## 2015-08-05 LAB — BASIC METABOLIC PANEL
BUN: 26 mg/dL — AB (ref 4–21)
Creatinine: 1.2 mg/dL — AB (ref 0.5–1.1)
Glucose: 147 mg/dL
POTASSIUM: 4.2 mmol/L (ref 3.4–5.3)
SODIUM: 137 mmol/L (ref 137–147)

## 2015-08-05 LAB — HEPATIC FUNCTION PANEL
ALT: 17 U/L (ref 7–35)
AST: 24 U/L (ref 13–35)
Alkaline Phosphatase: 38 U/L (ref 25–125)
Bilirubin, Total: 0.6 mg/dL

## 2015-08-05 LAB — CBC AND DIFFERENTIAL
HCT: 38 % (ref 36–46)
HEMOGLOBIN: 12.9 g/dL (ref 12.0–16.0)
Platelets: 138 10*3/uL — AB (ref 150–399)
WBC: 4.3 10^3/mL

## 2015-08-05 NOTE — Progress Notes (Signed)
Patient ID: Lorraine [name], female   DOB: 07-Feb-1927, 81 y.o.   MRN: [id number]     Nursing Home Location:  [place]    Patient Care Team: Gayland Curry, DO as PCP - General (Geriatric Medicine) Mardene Celeste, NP as Nurse Practitioner (Geriatric Medicine) Neldon Mc, MD as Consulting Physician (General Surgery) Particia Nearing, MD as Consulting Physician (Dermatology) Monna Fam, MD as Consulting Physician (Ophthalmology) Garlan Fair, MD as Consulting Physician (Gastroenterology) Kary Kos, MD as Consulting Physician (Neurosurgery) Elsie Saas, MD as Consulting Physician (Orthopedic Surgery) Selinda Orion, MD as Consulting Physician (Obstetrics and Gynecology) [facility] Rolm Bookbinder, MD as Consulting Physician (Dermatology)   Place of Service: SNF (31)  Chief Complaint  Patient presents with  . Acute Visit    hypotension    HPI:  81 y.o. female residing at [facility], assisted living. She has a hx of HTN, TIA, constipation, anal fissure, DM2, MCI, anemia, HLD, and hypercalcemia. I was asked to see her today due to feelings of weakness and hypotension. She was otherwise feeling well til mid morning when she began to have an unsteady gait and feel weak. Her caretaker alerted the staff and her BP sitting up was 68/40 which improved with lying down to 108/50.  Her pulse was in the 70's but was not checked between BP readings. She denies any cough, congestion, abd pain, n, v, d, dizziness, CP, and feels fine as long as she is lying down. She is eating and drinking normally and had a BM yesterday. She denies any urinary symptoms. Cognitively she is at baseline with an MMSE of 17/30 at her last check. S    Review of Systems:  Review of Systems  Constitutional: Positive for activity change. Negative for fever, chills, diaphoresis, appetite change, fatigue and unexpected weight change.  HENT: Negative  for congestion and rhinorrhea.   Eyes: Negative for photophobia, pain, discharge, redness, itching and visual disturbance.  Respiratory: Negative for cough, shortness of breath, wheezing and stridor.   Cardiovascular: Negative for chest pain, palpitations and leg swelling.  Genitourinary: Negative for dysuria, hematuria and difficulty urinating.  Musculoskeletal: Positive for gait problem.  Skin: Negative for color change, pallor and rash.  Neurological: Positive for dizziness (resolved with lying down) and weakness. Negative for tremors, seizures, syncope, facial asymmetry, speech difficulty and headaches.  Psychiatric/Behavioral: Negative for behavioral problems, confusion and agitation.    Medications: Patient's Medications  New Prescriptions   No medications on file  Previous Medications   ACETAMINOPHEN (TYLENOL) 325 MG TABLET    Take 650 mg by mouth. Take two tablets twice daily as needed to reduce back pain    CALCIUM CARBONATE-VITAMIN D 600-400 MG-UNIT PER CHEW TABLET    Chew 1 tablet by mouth 2 (two) times daily.   CYANOCOBALAMIN 1000 MCG TABLET    Take 100 mcg by mouth daily.   HYDROCORTISONE CREAM 1 %    Apply 1 application topically 2 (two) times daily. To chest as needed   LEVOTHYROXINE (SYNTHROID, LEVOTHROID) 75 MCG TABLET    Take 75 mcg by mouth daily before breakfast.   METOPROLOL SUCCINATE (TOPROL-XL) 25 MG 24 HR TABLET    Take 25 mg by mouth daily.   MULTIVITAMIN-IRON-MINERALS-FOLIC ACID (CENTRUM) CHEWABLE TABLET    Chew 1 tablet by mouth daily.   NUTRITIONAL SUPPLEMENTS (ENSURE PO)    Take by mouth. Drink one can M-W-F   OMEGA-3 FATTY ACIDS (FISH OIL) 1000 MG CPDR  Take 1,000 mg by mouth daily.   POLYETHYLENE GLYCOL POWDER (GLYCOLAX/MIRALAX) POWDER    Take 17 g by mouth daily.   PREMARIN VAGINAL CREAM    1 gram to external ad perineum and vaginal introitus on Sunday, Wednesday and Friday  Modified Medications   No medications on file  Discontinued Medications   No  medications on file     Physical Exam: 97.0 19 95% 68/40 sitting, then to 108/50 lying  Physical Exam  Constitutional: She is oriented to person, place, and time and well-developed, well-nourished, and in no distress. No distress.  HENT:  Head: Normocephalic and atraumatic.  Nose: Nose normal.  Mouth/Throat: Oropharynx is clear and moist. No oropharyngeal exudate.  Eyes: Conjunctivae and EOM are normal. Pupils are equal, round, and reactive to light. Right eye exhibits no discharge. Left eye exhibits no discharge. No scleral icterus.  Neck: Normal range of motion. Neck supple. No JVD present. No tracheal deviation present. No thyromegaly present.  Cardiovascular: Normal rate and regular rhythm.   No murmur heard. No edema  Pulmonary/Chest: Effort normal and breath sounds normal. No respiratory distress. She has no wheezes.  Abdominal: Soft. Bowel sounds are normal. She exhibits no distension. There is no tenderness.  Musculoskeletal: She exhibits no edema or tenderness.  Lymphadenopathy:    She has no cervical adenopathy.  Neurological: She is alert and oriented to person, place, and time. No cranial nerve deficit.  Skin: Skin is warm and dry. No rash noted. She is not diaphoretic. No erythema. There is pallor.  Psychiatric: Affect normal.    Wt Readings from Last 3 Encounters:  05/25/15 148 lb (67.132 kg)  01/19/15 145 lb (65.772 kg)  10/06/14 147 lb (66.679 kg)     Labs reviewed/Significant Diagnostic Results:  Basic Metabolic Panel:  Recent Labs  09/16/14 05/19/15  NA 138 136*  K 3.9 3.9  BUN 21 19  CREATININE 1.1 1.0   Liver Function Tests:  Recent Labs  09/16/14 05/19/15  AST 21 22  ALT 14 12  ALKPHOS 44 44   No results for input(s): LIPASE, AMYLASE in the last 8760 hours. No results for input(s): AMMONIA in the last 8760 hours. CBC:  Recent Labs  09/16/14 05/19/15  WBC 5.0 4.7  HGB 12.6 12.7  HCT 38 37  PLT 138* 124*   CBG: No results for  input(s): GLUCAP in the last 8760 hours. TSH:  Recent Labs  09/16/14 01/11/15 05/19/15  TSH 5.15 1.91 4.60   A1C: Lab Results  Component Value Date   HGBA1C 5.1 07/08/2012   Lipid Panel:  Recent Labs  09/16/14  CHOL 175  HDL 43  LDLCALC 82  TRIG 249*       Assessment/Plan  1) Orthostatic hypotension -Unclear etiology at this point as she is eating and drinking normally and has no other symptoms -currently taking metoprolol 25 mg daily so this will be placed on hold -?dehydration, infection, postural instability from underlying neurodegenerative issue (does have MCI and shuffling gait) -Begin NS at 100 cc/hr the NSL -stat CBC, BMP -Bedrest till am then perform orthostatics and may have activity as tolerated if normal -transfer to rehab for more frequent monitoring and assitance -I called and left a message with her daughter concerning her care -if BP does not respond will send to the ER, for now she is stable enough to stay here  2) HTN -metoprolol on hold, see above     Cindi Carbon, [place] [phone]

## 2015-08-23 DIAGNOSIS — M79671 Pain in right foot: Secondary | ICD-10-CM | POA: Diagnosis not present

## 2015-08-23 DIAGNOSIS — L89892 Pressure ulcer of other site, stage 2: Secondary | ICD-10-CM | POA: Diagnosis not present

## 2015-08-23 DIAGNOSIS — B351 Tinea unguium: Secondary | ICD-10-CM | POA: Diagnosis not present

## 2015-08-23 DIAGNOSIS — M79672 Pain in left foot: Secondary | ICD-10-CM | POA: Diagnosis not present

## 2015-09-12 DIAGNOSIS — M545 Low back pain: Secondary | ICD-10-CM | POA: Diagnosis not present

## 2015-09-14 DIAGNOSIS — I714 Abdominal aortic aneurysm, without rupture: Secondary | ICD-10-CM | POA: Diagnosis not present

## 2015-09-15 ENCOUNTER — Non-Acute Institutional Stay: Payer: Medicare Other | Admitting: Adult Health

## 2015-09-15 ENCOUNTER — Encounter: Payer: Self-pay | Admitting: Adult Health

## 2015-09-15 DIAGNOSIS — M545 Low back pain, unspecified: Secondary | ICD-10-CM

## 2015-09-15 DIAGNOSIS — I714 Abdominal aortic aneurysm, without rupture, unspecified: Secondary | ICD-10-CM

## 2015-09-15 DIAGNOSIS — E039 Hypothyroidism, unspecified: Secondary | ICD-10-CM

## 2015-09-15 NOTE — Progress Notes (Signed)
Patient ID: Lorraine [name], female   DOB: 1927/01/19, 82 y.o.   MRN: [id number] .lgoo   Nursing Home Location:  [facility]   Code Status:  Code Status History    Date Active Date Inactive Code Status Order ID Comments User Context   07/09/2012  1:14 AM 07/09/2012  8:37 PM DNR [id number]  Lemar Livings, RN ED      Patient Care Team: Gayland Curry, DO as PCP - General (Geriatric Medicine) Mardene Celeste, NP as Nurse Practitioner (Geriatric Medicine) Neldon Mc, MD as Consulting Physician (General Surgery) Particia Nearing, MD as Consulting Physician (Dermatology) Monna Fam, MD as Consulting Physician (Ophthalmology) Garlan Fair, MD as Consulting Physician (Gastroenterology) Kary Kos, MD as Consulting Physician (Neurosurgery) Elsie Saas, MD as Consulting Physician (Orthopedic Surgery) Selinda Orion, MD as Consulting Physician (Obstetrics and Gynecology) [facility] Rolm Bookbinder, MD as Consulting Physician (Dermatology)   Place of Service: ALF [phone])  Chief Complaint  Patient presents with  . Acute Visit    low back pain, f/u abd u/s    HPI:  82 y.o.female  residing at [facility], memory care unit, [place] status. I was asked to see her due to pain in the lumbar and sacral area, s/p fall on 1/9.  An xray of the lumbar spine was obtained showing significant DDD with osteophytes, but no acute fracture. An AAA was also noted. An abd ultrasound was obtained on 1/11 showing an AAA mid segment 4.16x4.63cm and a distal segment (4.28 x 3.51 cm).  The fall was mechanical in nature and OT has been consulted to look at ways to reduce falls through room rearrangement. She denies any abd pain at this time. Reports some low back pain without radiation, numbness, or tingling. She continues to ambulate normally and eat and drink normally as well. Her BP has been low to normal. She is using tylenol prn with some relief. Recently  the resident was transferred from an AL apt to memory care due to advancing dementia.      Review of Systems:  Review of Systems  Constitutional: Negative for fever, chills, diaphoresis, activity change, appetite change, fatigue and unexpected weight change.  HENT: Negative for congestion.   Respiratory: Negative for cough and shortness of breath.   Cardiovascular: Negative for chest pain.  Genitourinary: Negative for dysuria.  Musculoskeletal: Positive for back pain, arthralgias and gait problem (uses walker). Negative for myalgias, joint swelling, neck pain and neck stiffness.  Skin: Negative for rash and wound.  Neurological: Negative for dizziness, weakness and numbness.  Psychiatric/Behavioral: Positive for confusion. Negative for behavioral problems and agitation.    Medications: Patient's Medications  New Prescriptions   No medications on file  Previous Medications   ACETAMINOPHEN (TYLENOL) 325 MG TABLET    Take 650 mg by mouth. Take two tablets twice daily as needed to reduce back pain    CALCIUM CARBONATE-VITAMIN D 600-400 MG-UNIT PER CHEW TABLET    Chew 1 tablet by mouth 2 (two) times daily.   CYANOCOBALAMIN 1000 MCG TABLET    Take 100 mcg by mouth daily.   HYDROCORTISONE CREAM 1 %    Apply 1 application topically 2 (two) times daily. To chest as needed   LEVOTHYROXINE (SYNTHROID, LEVOTHROID) 75 MCG TABLET    Take 75 mcg by mouth daily before breakfast.   METOPROLOL SUCCINATE (TOPROL-XL) 25 MG 24 HR TABLET    Take 25 mg by mouth daily.   MULTIVITAMIN-IRON-MINERALS-FOLIC ACID (CENTRUM)  CHEWABLE TABLET    Chew 1 tablet by mouth daily.   NUTRITIONAL SUPPLEMENTS (ENSURE PO)    Take by mouth. Drink one can M-W-F   OMEGA-3 FATTY ACIDS (FISH OIL) 1000 MG CPDR    Take 1,000 mg by mouth daily.   POLYETHYLENE GLYCOL POWDER (GLYCOLAX/MIRALAX) POWDER    Take 17 g by mouth daily.   PREMARIN VAGINAL CREAM    1 gram to external ad perineum and vaginal introitus on Sunday, Wednesday and  Friday  Modified Medications   No medications on file  Discontinued Medications   No medications on file     Physical Exam:  Filed Vitals:   09/15/15 1432  BP: 107/60  Pulse: 77  Temp: 96.6 F (35.9 C)  Resp: 16  Weight: 143 lb 3.2 oz (64.955 kg)  SpO2: 94%    Physical Exam  Constitutional: She is oriented to person, place, and time. No distress.  HENT:  Head: Normocephalic and atraumatic.  Neck: No JVD present.  Cardiovascular: Normal rate, regular rhythm and intact distal pulses.   No murmur heard. No edema. No bruit noted to abd.  Pulmonary/Chest: Effort normal and breath sounds normal. No respiratory distress.  Abdominal: Soft. Bowel sounds are normal. She exhibits no distension.  Musculoskeletal: Normal range of motion. She exhibits no edema or tenderness.  Neg SLR  Neurological: She is alert and oriented to person, place, and time.  Skin: Skin is warm and dry. She is not diaphoretic.  Psychiatric: Affect normal.    Wt Readings from Last 3 Encounters:  09/15/15 143 lb 3.2 oz (64.955 kg)  05/25/15 148 lb (67.132 kg)  01/19/15 145 lb (65.772 kg)     Labs reviewed/Significant Diagnostic Results:  Basic Metabolic Panel:  Recent Labs  09/16/14 05/19/15 08/05/15  NA 138 136* 137  K 3.9 3.9 4.2  BUN 21 19 26*  CREATININE 1.1 1.0 1.2*   Liver Function Tests:  Recent Labs  09/16/14 05/19/15 08/05/15  AST 21 22 24   ALT 14 12 17   ALKPHOS 44 44 38   No results for input(s): LIPASE, AMYLASE in the last 8760 hours. No results for input(s): AMMONIA in the last 8760 hours. CBC:  Recent Labs  09/16/14 05/19/15 08/05/15  WBC 5.0 4.7 4.3  HGB 12.6 12.7 12.9  HCT 38 37 38  PLT 138* 124* 138*   CBG: No results for input(s): GLUCAP in the last 8760 hours. TSH:  Recent Labs  01/11/15 05/19/15 06/30/15  TSH 1.91 4.60 0.16*   A1C: Lab Results  Component Value Date   HGBA1C 5.1 07/08/2012   Lipid Panel:  Recent Labs  09/16/14  CHOL 175  HDL 43   LDLCALC 82  TRIG 249*       Assessment/Plan   1. AAA (abdominal aortic aneurysm) without rupture ([place]) -incidental finding on xray ordered for low back pain due to a fall -will need monitoring annually but given her age 82 and advancing dementia she would not most likely be a surgical candidate -BP within normal limits, needs frequent monitoring -lipid panel next draw  2. Bilateral low back pain without sciatica -unrelated from #1, due to fall and significant DDD -recommend scheduled tylenol TID for 1 week  3. Hypothyroidism -TSH running low, may need to adjust synthroid  Labs/tests ordered Lipid panel next draw, also TSH   Cindi Carbon, [place] [phone]

## 2015-09-16 DIAGNOSIS — R278 Other lack of coordination: Secondary | ICD-10-CM | POA: Diagnosis not present

## 2015-09-16 DIAGNOSIS — Z7389 Other problems related to life management difficulty: Secondary | ICD-10-CM | POA: Diagnosis not present

## 2015-09-16 DIAGNOSIS — M6281 Muscle weakness (generalized): Secondary | ICD-10-CM | POA: Diagnosis not present

## 2015-09-18 DIAGNOSIS — M6281 Muscle weakness (generalized): Secondary | ICD-10-CM | POA: Diagnosis not present

## 2015-09-18 DIAGNOSIS — R278 Other lack of coordination: Secondary | ICD-10-CM | POA: Diagnosis not present

## 2015-09-18 DIAGNOSIS — Z7389 Other problems related to life management difficulty: Secondary | ICD-10-CM | POA: Diagnosis not present

## 2015-09-20 DIAGNOSIS — M6281 Muscle weakness (generalized): Secondary | ICD-10-CM | POA: Diagnosis not present

## 2015-09-20 DIAGNOSIS — R278 Other lack of coordination: Secondary | ICD-10-CM | POA: Diagnosis not present

## 2015-09-20 DIAGNOSIS — E785 Hyperlipidemia, unspecified: Secondary | ICD-10-CM | POA: Diagnosis not present

## 2015-09-20 DIAGNOSIS — E079 Disorder of thyroid, unspecified: Secondary | ICD-10-CM | POA: Diagnosis not present

## 2015-09-20 DIAGNOSIS — E039 Hypothyroidism, unspecified: Secondary | ICD-10-CM | POA: Diagnosis not present

## 2015-09-20 DIAGNOSIS — Z7389 Other problems related to life management difficulty: Secondary | ICD-10-CM | POA: Diagnosis not present

## 2015-09-21 DIAGNOSIS — Z7389 Other problems related to life management difficulty: Secondary | ICD-10-CM | POA: Diagnosis not present

## 2015-09-21 DIAGNOSIS — M6281 Muscle weakness (generalized): Secondary | ICD-10-CM | POA: Diagnosis not present

## 2015-09-21 DIAGNOSIS — R278 Other lack of coordination: Secondary | ICD-10-CM | POA: Diagnosis not present

## 2015-09-26 DIAGNOSIS — R278 Other lack of coordination: Secondary | ICD-10-CM | POA: Diagnosis not present

## 2015-09-26 DIAGNOSIS — M6281 Muscle weakness (generalized): Secondary | ICD-10-CM | POA: Diagnosis not present

## 2015-09-26 DIAGNOSIS — Z7389 Other problems related to life management difficulty: Secondary | ICD-10-CM | POA: Diagnosis not present

## 2015-09-28 DIAGNOSIS — Z7389 Other problems related to life management difficulty: Secondary | ICD-10-CM | POA: Diagnosis not present

## 2015-09-28 DIAGNOSIS — R278 Other lack of coordination: Secondary | ICD-10-CM | POA: Diagnosis not present

## 2015-09-28 DIAGNOSIS — M6281 Muscle weakness (generalized): Secondary | ICD-10-CM | POA: Diagnosis not present

## 2015-09-30 DIAGNOSIS — Z7389 Other problems related to life management difficulty: Secondary | ICD-10-CM | POA: Diagnosis not present

## 2015-09-30 DIAGNOSIS — M6281 Muscle weakness (generalized): Secondary | ICD-10-CM | POA: Diagnosis not present

## 2015-09-30 DIAGNOSIS — R278 Other lack of coordination: Secondary | ICD-10-CM | POA: Diagnosis not present

## 2015-10-25 DIAGNOSIS — B351 Tinea unguium: Secondary | ICD-10-CM | POA: Diagnosis not present

## 2015-10-25 DIAGNOSIS — M79671 Pain in right foot: Secondary | ICD-10-CM | POA: Diagnosis not present

## 2015-10-25 DIAGNOSIS — L89892 Pressure ulcer of other site, stage 2: Secondary | ICD-10-CM | POA: Diagnosis not present

## 2015-10-25 DIAGNOSIS — M79672 Pain in left foot: Secondary | ICD-10-CM | POA: Diagnosis not present

## 2015-11-11 ENCOUNTER — Non-Acute Institutional Stay: Payer: Medicare Other | Admitting: Adult Health

## 2015-11-11 ENCOUNTER — Encounter: Payer: Self-pay | Admitting: Adult Health

## 2015-11-11 DIAGNOSIS — K219 Gastro-esophageal reflux disease without esophagitis: Secondary | ICD-10-CM | POA: Diagnosis not present

## 2015-11-11 DIAGNOSIS — R0902 Hypoxemia: Secondary | ICD-10-CM

## 2015-11-11 DIAGNOSIS — J069 Acute upper respiratory infection, unspecified: Secondary | ICD-10-CM | POA: Diagnosis not present

## 2015-11-11 DIAGNOSIS — R0602 Shortness of breath: Secondary | ICD-10-CM | POA: Diagnosis not present

## 2015-11-11 DIAGNOSIS — R05 Cough: Secondary | ICD-10-CM | POA: Diagnosis not present

## 2015-11-11 DIAGNOSIS — R059 Cough, unspecified: Secondary | ICD-10-CM

## 2015-11-11 LAB — BASIC METABOLIC PANEL
BUN: 26 mg/dL — AB (ref 4–21)
Creatinine: 1.5 mg/dL — AB (ref 0.5–1.1)
Glucose: 154 mg/dL
POTASSIUM: 3.7 mmol/L (ref 3.4–5.3)
Sodium: 132 mmol/L — AB (ref 137–147)

## 2015-11-11 LAB — CBC AND DIFFERENTIAL
HCT: 34 % — AB (ref 36–46)
HEMOGLOBIN: 11.3 g/dL — AB (ref 12.0–16.0)
PLATELETS: 128 10*3/uL — AB (ref 150–399)
WBC: 5 10*3/mL

## 2015-11-11 NOTE — Progress Notes (Signed)
Patient ID: Lorraine [name], female   DOB: 15-Oct-1926, 82 y.o.   MRN: [id number]  Location:  [facility]:  ALF (13) Provider:  Royal Hawthorn NP  Hollace Kinnier, DO  Patient Care Team: Gayland Curry, DO as PCP - General (Geriatric Medicine) Mardene Celeste, NP as Nurse Practitioner (Geriatric Medicine) Neldon Mc, MD as Consulting Physician (General Surgery) Particia Nearing, MD as Consulting Physician (Dermatology) Monna Fam, MD as Consulting Physician (Ophthalmology) Garlan Fair, MD as Consulting Physician (Gastroenterology) Kary Kos, MD as Consulting Physician (Neurosurgery) Elsie Saas, MD as Consulting Physician (Orthopedic Surgery) Selinda Orion, MD as Consulting Physician (Obstetrics and Gynecology) [facility] Rolm Bookbinder, MD as Consulting Physician (Dermatology)  Extended Emergency Contact Information Primary Emergency Contact: Garcon Point of [place] Phone: [phone] Mobile Phone: [phone] Relation: Daughter Secondary Emergency Contact: Hubert Azure, [place] [address] Johnnette Litter of [place] Phone: [phone] Work Phone: [phone] Mobile Phone: [phone] Relation: Other  Code Status:  DNR Goals of care: Advanced Directive information Advanced Directives 05/25/2015  Does patient have an advance directive? Yes  Type of Paramedic of [place];Living will;Out of facility DNR (pink MOST or yellow form)  Copy of advanced directive(s) in chart? Yes  Pre-existing out of facility DNR order (yellow form or pink MOST form) Yellow form placed in chart (order not valid for inpatient use)     Chief Complaint  Patient presents with  . Acute Visit    cough    HPI:  Pt is a 82 y.o. female seen today for an acute visit for cough present for 4 weeks.  I first heard about the cough last week which was dry and noted with meals  without fever, ST was consulted. On 3/9 the cough was noted to be present af after meals. She was placed on Protonix.  11/11/2015 she has sats of 89% without acute distress, temp of 100.9 and increased confusion. She has no SOB or CP. She is eating and drinking well and has no focal weakness. ST has not noted any significant swallowing issues but has not received a MBS. She has a hx of dysphagia per the records.  Of note the resident fell on 2/20 and hit head with a residual bruise to the right side of her face. The fall was mechanical in nature without residual injury.     Past Medical History  Diagnosis Date  . Cataracts, bilateral   . Arthritis   . Skin cancer   . Diabetes mellitus without complication ([place])   . Other specified diseases of blood and blood-forming organs(289.89)     s/p Chelation treatment  . Ventral hernia, unspecified, without mention of obstruction or gangrene   . Diverticulosis of colon (without mention of hemorrhage)   . Diffuse cystic mastopathy 1997    s/p right breast bx, benign  . Digestive-genital tract fistula, female   . Other psoriasis   . Other lichen, not elsewhere classified [id number]    Lichen sclerosis perineum/perianal  . Alopecia areata   . Lumbago 2001    lumbar radiculopathy L3, spondylosis L2-3, 3-4. dx Dr.Cram  . Dizziness and giddiness     benign positional vertigo  . Insomnia, unspecified   . Other malaise and fatigue   . Abnormality of gait 2008    re: metoclopramide.   Marland Kitchen Dysphagia, pharyngoesophageal phase   . Closed fracture of lateral malleolus   .  Hyperlipidemia   . Unspecified constipation   . Thyroid disease   . GERD (gastroesophageal reflux disease)   . B12 deficiency   . Anal fissure   . Hypertension   . Depression   . Mild cognitive impairment with memory loss 12/16/2013   Past Surgical History  Procedure Laterality Date  . Appendectomy/ovarian cystectomy  1950s  . Breast lumpectomy  1997  . Hernia repair    . Vaginal  hysterectomy  1998  . Tonsillectomy and adenoidectomy  1942  . Cystectomy  1950's    brachial cleft  . Bunionectomy Bilateral 1975    twice  . Cataract extraction w/ intraocular lens  implant, bilateral  1990  . Breast biopsy  1997    right benign  . Colectomy  11/2007    Sigmoid w/closure of fistula and repair of umbilical hernia    Allergies  Allergen Reactions  . Codeine     Unknown   . Erythromycin     Unknown       Medication List       This list is accurate as of: 11/11/15 11:39 AM.  Always use your most recent med list.               acetaminophen 325 MG tablet  Commonly known as:  TYLENOL  Take 650 mg by mouth. Take two tablets twice daily as needed to reduce back pain     Calcium Carbonate-Vitamin D 600-400 MG-UNIT chew tablet  Chew 1 tablet by mouth 2 (two) times daily.     cyanocobalamin 1000 MCG tablet  Take 100 mcg by mouth daily.     ENSURE PO  Take by mouth. Drink one can M-W-F     Fish Oil 1000 MG Cpdr  Take 1,000 mg by mouth daily.     hydrocortisone cream 1 %  Apply 1 application topically 2 (two) times daily. To chest as needed     ipratropium-albuterol 0.5-2.5 (3) MG/3ML Soln  Commonly known as:  DUONEB  Take 3 mLs by nebulization every 6 (six) hours as needed.     levothyroxine 75 MCG tablet  Commonly known as:  SYNTHROID, LEVOTHROID  Take 75 mcg by mouth daily before breakfast.     metoprolol succinate 25 MG 24 hr tablet  Commonly known as:  TOPROL-XL  Take 25 mg by mouth daily.     multivitamin-iron-minerals-folic acid chewable tablet  Chew 1 tablet by mouth daily.     pantoprazole 40 MG tablet  Commonly known as:  PROTONIX  Take 40 mg by mouth 2 (two) times daily.     polyethylene glycol powder powder  Commonly known as:  GLYCOLAX/MIRALAX  Take 17 g by mouth daily.     PREMARIN vaginal cream  Generic drug:  conjugated estrogens  1 gram to external ad perineum and vaginal introitus on Sunday, Wednesday and Friday         Review of Systems  Constitutional: Positive for fever, chills, diaphoresis and fatigue. Negative for activity change and appetite change.  HENT: Positive for congestion and rhinorrhea. Negative for sinus pressure, sneezing, sore throat, tinnitus, trouble swallowing and voice change.   Respiratory: Positive for cough. Negative for shortness of breath, wheezing and stridor.   Cardiovascular: Negative for chest pain, palpitations and leg swelling.  Gastrointestinal: Negative for abdominal pain, diarrhea, constipation and abdominal distention.  Genitourinary: Negative for dysuria and difficulty urinating.  Musculoskeletal: Positive for gait problem. Negative for back pain and arthralgias.  Neurological: Negative for dizziness,  facial asymmetry and speech difficulty.  Psychiatric/Behavioral: Positive for confusion. Negative for behavioral problems and agitation.    Immunization History  Administered Date(s) Administered  . Influenza Whole 06/03/2012, 06/17/2013  . Influenza-Unspecified 06/09/2014, 06/16/2015  . PPD Test 09/29/2012  . Pneumococcal Polysaccharide-23 09/03/1998  . Td 09/03/1998  . Zoster 09/04/2007   Pertinent  Health Maintenance Due  Topic Date Due  . FOOT EXAM  06/30/1939  . OPHTHALMOLOGY EXAM  06/30/1939  . URINE MICROALBUMIN  06/30/1939  . PNA vac Low Risk Adult (2 of 2 - PCV13) 09/04/1999  . HEMOGLOBIN A1C  01/05/2013  . INFLUENZA VACCINE  04/03/2016  . DEXA SCAN  Completed   Fall Risk  01/19/2015 12/16/2013  Falls in the past year? Yes No  Number falls in past yr: 1 -  Injury with Fall? No -   Functional Status Survey:    Filed Vitals:   11/11/15 1120  BP: 101/61  Pulse: 105  Temp: 100.9 F (38.3 C)  Resp: 20  SpO2: 89%   There is no weight on file to calculate BMI. Physical Exam  Constitutional: No distress.  HENT:  Head: Normocephalic.  Right Ear: External ear normal.  Left Ear: External ear normal.  Mouth/Throat: Oropharyngeal exudate  present.  Residual bruising to left side of face from fall. OP erythema  Eyes: Conjunctivae and EOM are normal. Pupils are equal, round, and reactive to light. Right eye exhibits no discharge. Left eye exhibits no discharge.  Neck: Normal range of motion. Neck supple. No JVD present. No tracheal deviation present. No thyromegaly present.  Cardiovascular: Normal rate and regular rhythm.   No murmur heard. Pulmonary/Chest: Effort normal. No respiratory distress.  Bilateral exp wheeze and crackles to right base  Abdominal: Soft. Bowel sounds are normal. She exhibits no distension. There is no tenderness.  Lymphadenopathy:    She has cervical adenopathy.  Neurological: She is alert. No cranial nerve deficit.  Only alert to self and situation, could not remember her daughters name, difficulty following commands for coordination test  Skin: Skin is warm. She is not diaphoretic.  moist  Psychiatric: She has a normal mood and affect.    Labs reviewed:  Recent Labs  05/19/15 08/05/15  NA 136* 137  K 3.9 4.2  BUN 19 26*  CREATININE 1.0 1.2*    Recent Labs  05/19/15 08/05/15  AST 22 24  ALT 12 17  ALKPHOS 44 38    Recent Labs  05/19/15 08/05/15  WBC 4.7 4.3  HGB 12.7 12.9  HCT 37 38  PLT 124* 138*   Lab Results  Component Value Date   TSH 0.16* 06/30/2015   Lab Results  Component Value Date   HGBA1C 5.1 07/08/2012   Lab Results  Component Value Date   CHOL 175 09/16/2014   HDL 43 09/16/2014   LDLCALC 82 09/16/2014   TRIG 249* 09/16/2014   CHOLHDL 3.5 07/09/2012    Significant Diagnostic Results in last 30 days:  No results found.  Assessment/Plan  1. Cough -present for 4 weeks -check CXR, due to prolonged hx of cough will tx with antibiotic therapy, await CXR to guide therapy -consider aspiration and reflux as causes for the cough, which seemed to be noted after meals  2. Hypoxia -mild, improved with 02 -continue 02 at 2L titrate for sat<90%  3.  Gastroesophageal reflux disease without esophagitis -started Protonix on 3/9 -she has a hx of this and has failed trials off of PPI in the past -would continue  protonix BID for 4 weeks then reduce to QD   4. Dysphagia -has a hx of this but has been tolerating a regular diet with cough noted in #1 -ST is involved but no clear signs of aspiration, may need swallow study when more stable    Family/ staff Communication: discussed with floor nurse and supervisor  Labs/tests ordered:  CBC, BMP, CXR  Cindi Carbon, [place] [phone]

## 2015-11-23 ENCOUNTER — Encounter: Payer: Medicare Other | Admitting: Internal Medicine

## 2015-12-09 ENCOUNTER — Non-Acute Institutional Stay: Payer: Medicare Other | Admitting: Adult Health

## 2015-12-09 DIAGNOSIS — R634 Abnormal weight loss: Secondary | ICD-10-CM | POA: Diagnosis not present

## 2015-12-09 DIAGNOSIS — F0390 Unspecified dementia without behavioral disturbance: Secondary | ICD-10-CM | POA: Diagnosis not present

## 2015-12-09 DIAGNOSIS — E039 Hypothyroidism, unspecified: Secondary | ICD-10-CM | POA: Diagnosis not present

## 2015-12-09 DIAGNOSIS — D696 Thrombocytopenia, unspecified: Secondary | ICD-10-CM | POA: Diagnosis not present

## 2015-12-09 DIAGNOSIS — I10 Essential (primary) hypertension: Secondary | ICD-10-CM

## 2015-12-09 DIAGNOSIS — E119 Type 2 diabetes mellitus without complications: Secondary | ICD-10-CM

## 2015-12-13 ENCOUNTER — Encounter: Payer: Self-pay | Admitting: Adult Health

## 2015-12-13 DIAGNOSIS — F0390 Unspecified dementia without behavioral disturbance: Secondary | ICD-10-CM | POA: Insufficient documentation

## 2015-12-13 NOTE — Progress Notes (Addendum)
Patient ID: Lorraine [name], female   DOB: 05-Jan-1927, 82 y.o.   MRN: [id number]  Location:  [facility]:  ALF (13) Provider:   Cindi Carbon, ANP [place] [phone]   REED, Jonelle Sidle, DO  Patient Care Team: Gayland Curry, DO as PCP - General (Geriatric Medicine) Mardene Celeste, NP as Nurse Practitioner (Geriatric Medicine) Neldon Mc, MD as Consulting Physician (General Surgery) Particia Nearing, MD as Consulting Physician (Dermatology) Monna Fam, MD as Consulting Physician (Ophthalmology) Garlan Fair, MD as Consulting Physician (Gastroenterology) Kary Kos, MD as Consulting Physician (Neurosurgery) Elsie Saas, MD as Consulting Physician (Orthopedic Surgery) Selinda Orion, MD as Consulting Physician (Obstetrics and Gynecology) [facility] Rolm Bookbinder, MD as Consulting Physician (Dermatology)  Extended Emergency Contact Information Primary Emergency Contact: Mariano Colon of [place] Phone: [phone] Mobile Phone: [phone] Relation: Daughter Secondary Emergency Contact: Hubert Azure, [place] [address] Johnnette Litter of [place] Phone: [phone] Work Phone: [phone] Mobile Phone: [phone] Relation: Other  Code Status:  DNR Goals of care: Advanced Directive information Advanced Directives 05/25/2015  Does patient have an advance directive? Yes  Type of Paramedic of [place];Living will;Out of facility DNR (pink MOST or yellow form)  Copy of advanced directive(s) in chart? Yes  Pre-existing out of facility DNR order (yellow form or pink MOST form) Yellow form placed in chart (order not valid for inpatient use)     Chief Complaint  Patient presents with  . Medical Management of Chronic Issues    HPI:  Pt is a 82 y.o. female seen today for medical management of chronic diseases.  She has a hx of  progressive memory loss and resides in the memory care unit at [facility]. She has caretakers during the day to help her with ADL's.  She had a gastroenteritis on 3/29 which lasted for two days but has resolved.  1. Type 2 diabetes mellitus without complication, without long-term current use of insulin (HCC) -fasting cbg noted 140-150's range -not currently on meds  2. Hypothyroidism, unspecified hypothyroidism type Lab Results  Component Value Date   TSH 0.16* 06/30/2015  -Free T4 1.42, Free T3 2.4  -currently on synthroid 75 mcg  3. Dementia, without behavioral disturbance -progressive, move from AL to memory care  -currently on  namenda -daughter questions of it is benefiting her  4. Loss of weight -has lost 6 lbs in the past month  5. Thrombocytopenia (HCC) -mild decrease, not on asa, no bleeding Lab Results  Component Value Date   PLT 128* 11/11/2015    6. Essential hypertension -controlled on metoprolol -no edema or sob -has a hx of AAA, monitored by [place]  Functional status: ambulatory with a walker   Past Medical History  Diagnosis Date  . Cataracts, bilateral   . Arthritis   . Skin cancer   . Diabetes mellitus without complication ([place])   . Other specified diseases of blood and blood-forming organs(289.89)     s/p Chelation treatment  . Ventral hernia, unspecified, without mention of obstruction or gangrene   . Diverticulosis of colon (without mention of hemorrhage)   . Diffuse cystic mastopathy 1997    s/p right breast bx, benign  . Digestive-genital tract fistula, female   . Other psoriasis   . Other lichen, not elsewhere classified [id number]    Lichen sclerosis perineum/perianal  . Alopecia areata   . Lumbago  2001    lumbar radiculopathy L3, spondylosis L2-3, 3-4. dx Dr.Cram  . Dizziness and giddiness     benign positional vertigo  . Insomnia, unspecified   . Other malaise and fatigue   . Abnormality of gait 2008    re: metoclopramide.   Marland Kitchen Dysphagia,  pharyngoesophageal phase   . Closed fracture of lateral malleolus   . Hyperlipidemia   . Unspecified constipation   . Thyroid disease   . GERD (gastroesophageal reflux disease)   . B12 deficiency   . Anal fissure   . Hypertension   . Depression   . Mild cognitive impairment with memory loss 12/16/2013   Past Surgical History  Procedure Laterality Date  . Appendectomy/ovarian cystectomy  1950s  . Breast lumpectomy  1997  . Hernia repair    . Vaginal hysterectomy  1998  . Tonsillectomy and adenoidectomy  1942  . Cystectomy  1950's    brachial cleft  . Bunionectomy Bilateral 1975    twice  . Cataract extraction w/ intraocular lens  implant, bilateral  1990  . Breast biopsy  1997    right benign  . Colectomy  11/2007    Sigmoid w/closure of fistula and repair of umbilical hernia    Allergies  Allergen Reactions  . Codeine     Unknown   . Erythromycin     Unknown       Medication List       This list is accurate as of: 12/09/15 11:59 PM.  Always use your most recent med list.               acetaminophen 325 MG tablet  Commonly known as:  TYLENOL  Take 650 mg by mouth. Take two tablets twice daily as needed to reduce back pain     Calcium Carbonate-Vitamin D 600-400 MG-UNIT chew tablet  Chew 1 tablet by mouth 2 (two) times daily.     cyanocobalamin 1000 MCG tablet  Take 100 mcg by mouth daily.     ENSURE PO  Take by mouth. Drink one can M-W-F     Fish Oil 1000 MG Cpdr  Take 1,000 mg by mouth daily.     hydrocortisone cream 1 %  Apply 1 application topically 2 (two) times daily. To chest as needed     ipratropium-albuterol 0.5-2.5 (3) MG/3ML Soln  Commonly known as:  DUONEB  Take 3 mLs by nebulization every 6 (six) hours as needed.     levothyroxine 75 MCG tablet  Commonly known as:  SYNTHROID, LEVOTHROID  Take 75 mcg by mouth daily before breakfast.     memantine 10 MG tablet  Commonly known as:  NAMENDA  Take 10 mg by mouth 2 (two) times daily.       metoprolol succinate 25 MG 24 hr tablet  Commonly known as:  TOPROL-XL  Take 25 mg by mouth daily.     multivitamin-iron-minerals-folic acid chewable tablet  Chew 1 tablet by mouth daily.     pantoprazole 40 MG tablet  Commonly known as:  PROTONIX  Take 40 mg by mouth daily.     polyethylene glycol powder powder  Commonly known as:  GLYCOLAX/MIRALAX  Take 17 g by mouth daily.     PREMARIN vaginal cream  Generic drug:  conjugated estrogens  1 gram to external ad perineum and vaginal introitus on Sunday, Wednesday and Friday     UNABLE TO FIND  Med Name: pt takes risa bid and tumeric daily  Review of Systems  Constitutional: Positive for unexpected weight change. Negative for fever, chills, activity change, appetite change and fatigue.  HENT: Negative for congestion.   Respiratory: Negative for cough, shortness of breath and wheezing.   Cardiovascular: Negative for chest pain, palpitations and leg swelling.  Gastrointestinal: Negative for abdominal pain, diarrhea, constipation, blood in stool and abdominal distention.  Genitourinary: Negative for dysuria.  Musculoskeletal: Positive for back pain (h/o, denies right now), arthralgias and gait problem.  Skin: Negative for color change and pallor.  Neurological: Negative for dizziness and facial asymmetry.  Psychiatric/Behavioral: Positive for confusion. Negative for behavioral problems and agitation. The patient is not nervous/anxious.     Immunization History  Administered Date(s) Administered  . Influenza Whole 06/03/2012, 06/17/2013  . Influenza-Unspecified 06/09/2014, 06/16/2015  . PPD Test 09/29/2012  . Pneumococcal Polysaccharide-23 09/03/1998  . Td 09/03/1998  . Zoster 09/04/2007   Pertinent  Health Maintenance Due  Topic Date Due  . FOOT EXAM  06/30/1939  . OPHTHALMOLOGY EXAM  06/30/1939  . URINE MICROALBUMIN  06/30/1939  . PNA vac Low Risk Adult (2 of 2 - PCV13) 09/04/1999  . HEMOGLOBIN A1C   01/05/2013  . INFLUENZA VACCINE  04/03/2016  . DEXA SCAN  Completed   Fall Risk  01/19/2015 12/16/2013  Falls in the past year? Yes No  Number falls in past yr: 1 -  Injury with Fall? No -   Functional Status Survey:    Filed Vitals:   12/13/15 1122  Weight: 137 lb (62.143 kg)   Body mass index is 22.8 kg/(m^2). Physical Exam  Constitutional: She appears well-developed and well-nourished. No distress.  HENT:  Has alopecia  Neck: Normal range of motion. Neck supple. No JVD present. No thyromegaly present.  Cardiovascular: Normal rate and regular rhythm.   No murmur heard. Pulmonary/Chest: Effort normal and breath sounds normal.  Abdominal: Soft. Bowel sounds are normal.  Musculoskeletal: She exhibits no edema or tenderness.  Neurological: She is alert.  Oriented to self, place, and situation, not time. Able to follow commands and answer q's but not accurate historian  Skin: Skin is warm and dry. She is not diaphoretic.  Psychiatric: She has a normal mood and affect.  Nursing note and vitals reviewed.   Labs reviewed:  Recent Labs  05/19/15 08/05/15 11/11/15  NA 136* 137 132*  K 3.9 4.2 3.7  BUN 19 26* 26*  CREATININE 1.0 1.2* 1.5*    Recent Labs  05/19/15 08/05/15  AST 22 24  ALT 12 17  ALKPHOS 44 38    Recent Labs  05/19/15 08/05/15 11/11/15  WBC 4.7 4.3 5.0  HGB 12.7 12.9 11.3*  HCT 37 38 34*  PLT 124* 138* 128*   Lab Results  Component Value Date   TSH 0.16* 06/30/2015   Lab Results  Component Value Date   HGBA1C 5.1 07/08/2012   Lab Results  Component Value Date   CHOL 175 09/16/2014   HDL 43 09/16/2014   LDLCALC 82 09/16/2014   TRIG 249* 09/16/2014   CHOLHDL 3.5 07/09/2012    Significant Diagnostic Results in last 30 days:  No results found.  Assessment/Plan 1. Type 2 diabetes mellitus without complication, without long-term current use of insulin (HCC) -continue to monitor -no aggressive management due to age, advancing dementia, and  goals of care  2. Hypothyroidism, unspecified hypothyroidism type -decrease synthroid to 62.5 mcg  3. Dementia, without behavioral disturbance -progressive -continue to feed herself and remain verbal and ambulatory -for this reason would  continue namenda  4. Loss of weight -most likely due to recent GI illness, also TSH low  5. Thrombocytopenia (HCC) -check CBC, only mild decrease in plts  6. Essential hypertension -controlled    Family/ staff Communication: discussed with resident and caretaker  Labs/tests ordered:  CBC, BMP, TSH in 4 weeks   Cindi Carbon, [place] [phone]

## 2015-12-27 ENCOUNTER — Non-Acute Institutional Stay (SKILLED_NURSING_FACILITY): Payer: Medicare Other | Admitting: Internal Medicine

## 2015-12-27 DIAGNOSIS — E119 Type 2 diabetes mellitus without complications: Secondary | ICD-10-CM

## 2015-12-27 DIAGNOSIS — F329 Major depressive disorder, single episode, unspecified: Secondary | ICD-10-CM | POA: Diagnosis not present

## 2015-12-27 DIAGNOSIS — I10 Essential (primary) hypertension: Secondary | ICD-10-CM

## 2015-12-27 DIAGNOSIS — M79672 Pain in left foot: Secondary | ICD-10-CM | POA: Diagnosis not present

## 2015-12-27 DIAGNOSIS — E039 Hypothyroidism, unspecified: Secondary | ICD-10-CM | POA: Diagnosis not present

## 2015-12-27 DIAGNOSIS — I714 Abdominal aortic aneurysm, without rupture, unspecified: Secondary | ICD-10-CM

## 2015-12-27 DIAGNOSIS — R296 Repeated falls: Secondary | ICD-10-CM | POA: Diagnosis not present

## 2015-12-27 DIAGNOSIS — F32A Depression, unspecified: Secondary | ICD-10-CM

## 2015-12-27 DIAGNOSIS — L89892 Pressure ulcer of other site, stage 2: Secondary | ICD-10-CM | POA: Diagnosis not present

## 2015-12-27 DIAGNOSIS — M79671 Pain in right foot: Secondary | ICD-10-CM | POA: Diagnosis not present

## 2015-12-27 DIAGNOSIS — B351 Tinea unguium: Secondary | ICD-10-CM | POA: Diagnosis not present

## 2015-12-27 DIAGNOSIS — F0390 Unspecified dementia without behavioral disturbance: Secondary | ICD-10-CM | POA: Diagnosis not present

## 2015-12-27 NOTE — Progress Notes (Signed)
Patient ID: Lorraine [name], female   DOB: 1926/10/30, 82 y.o.   MRN: [id number]  Location:  [facility] Room Number: [id number] Place of Service:  SNF (31) Provider:  Marqueze Ramcharan L. Mariea Clonts, D.O., C.M.D.  Hollace Kinnier, DO  Patient Care Team: Gayland Curry, DO as PCP - General (Geriatric Medicine) Mardene Celeste, NP as Nurse Practitioner (Geriatric Medicine) Neldon Mc, MD as Consulting Physician (General Surgery) Particia Nearing, MD as Consulting Physician (Dermatology) Monna Fam, MD as Consulting Physician (Ophthalmology) Garlan Fair, MD as Consulting Physician (Gastroenterology) Kary Kos, MD as Consulting Physician (Neurosurgery) Elsie Saas, MD as Consulting Physician (Orthopedic Surgery) Selinda Orion, MD as Consulting Physician (Obstetrics and Gynecology) [facility] Rolm Bookbinder, MD as Consulting Physician (Dermatology)  Extended Emergency Contact Information Primary Emergency Contact: [place] of [place] Phone: [phone] Mobile Phone: [phone] Relation: Daughter Secondary Emergency Contact: Hubert Azure, [place] [address] Johnnette Litter of [place] Phone: [phone] Work Phone: [phone] Mobile Phone: [phone] Relation: Other  Code Status:  DNR Goals of care: Advanced Directive information Advanced Directives 12/27/2015  Does patient have an advance directive? Yes  Type of Advance Directive Out of facility DNR (pink MOST or yellow form);IXL;Living will  Copy of advanced directive(s) in chart? Yes  Pre-existing out of facility DNR order (yellow form or pink MOST form) Yellow form placed in chart (order not valid for inpatient use);Pink MOST form placed in chart (order not valid for inpatient use)     Chief Complaint  Patient presents with  . Medical Management of Chronic Issues    routine visit    HPI:  Pt is a 82 y.o.  female seen today for medical management of chronic diseases.   Since I last saw her, she's had several falls and developed some increased difficulty with dysphagia as her dementia progresses.  She continues to participate in activities in the memory care unit.  She herself has no complaints and seems content.    Past Medical History  Diagnosis Date  . Cataracts, bilateral   . Arthritis   . Skin cancer   . Diabetes mellitus without complication ([place])   . Other specified diseases of blood and blood-forming organs(289.89)     s/p Chelation treatment  . Ventral hernia, unspecified, without mention of obstruction or gangrene   . Diverticulosis of colon (without mention of hemorrhage)   . Diffuse cystic mastopathy 1997    s/p right breast bx, benign  . Digestive-genital tract fistula, female   . Other psoriasis   . Other lichen, not elsewhere classified [id number]    Lichen sclerosis perineum/perianal  . Alopecia areata   . Lumbago 2001    lumbar radiculopathy L3, spondylosis L2-3, 3-4. dx Dr.Cram  . Dizziness and giddiness     benign positional vertigo  . Insomnia, unspecified   . Other malaise and fatigue   . Abnormality of gait 2008    re: metoclopramide.   Marland Kitchen Dysphagia, pharyngoesophageal phase   . Closed fracture of lateral malleolus   . Hyperlipidemia   . Unspecified constipation   . Thyroid disease   . GERD (gastroesophageal reflux disease)   . B12 deficiency   . Anal fissure   . Hypertension   . Depression   . Mild cognitive impairment with memory loss 12/16/2013   Past Surgical History  Procedure Laterality Date  . Appendectomy/ovarian cystectomy  1950s  . Breast  lumpectomy  1997  . Hernia repair    . Vaginal hysterectomy  1998  . Tonsillectomy and adenoidectomy  1942  . Cystectomy  1950's    brachial cleft  . Bunionectomy Bilateral 1975    twice  . Cataract extraction w/ intraocular lens  implant, bilateral  1990  . Breast biopsy  1997    right benign  . Colectomy   11/2007    Sigmoid w/closure of fistula and repair of umbilical hernia    Allergies  Allergen Reactions  . Codeine     Unknown   . Erythromycin     Unknown       Medication List       This list is accurate as of: 12/27/15 11:59 PM.  Always use your most recent med list.               acetaminophen 325 MG tablet  Commonly known as:  TYLENOL  Take 650 mg by mouth. Take two tablets twice daily as needed to reduce back pain     Calcium Carbonate-Vitamin D 600-400 MG-UNIT chew tablet  Chew 1 tablet by mouth 2 (two) times daily.     cyanocobalamin 1000 MCG tablet  Take 100 mcg by mouth daily.     ENSURE PO  Take by mouth. Drink one can M-W-F     Fish Oil 1000 MG Cpdr  Take 1,000 mg by mouth daily.     hydrocortisone cream 1 %  Apply 1 application topically 2 (two) times daily. To chest as needed     ipratropium-albuterol 0.5-2.5 (3) MG/3ML Soln  Commonly known as:  DUONEB  Take 3 mLs by nebulization every 6 (six) hours as needed.     levothyroxine 75 MCG tablet  Commonly known as:  SYNTHROID, LEVOTHROID  Take 75 mcg by mouth daily before breakfast.     memantine 10 MG tablet  Commonly known as:  NAMENDA  Take 10 mg by mouth 2 (two) times daily.     multivitamin-iron-minerals-folic acid chewable tablet  Chew 1 tablet by mouth daily.     pantoprazole 40 MG tablet  Commonly known as:  PROTONIX  Take 40 mg by mouth daily.     polyethylene glycol powder powder  Commonly known as:  GLYCOLAX/MIRALAX  Take 17 g by mouth daily.     RISA-BID PROBIOTIC Tabs  Take 1 tablet by mouth daily.     Turmeric 500 MG Tabs  Take 1 tablet by mouth daily.     Vitamin D3 2000 units Tabs  Take 1 tablet by mouth daily.        Review of Systems  Constitutional: Negative for fever, chills, activity change and appetite change.  HENT: Negative for congestion.   Respiratory: Positive for cough.   Cardiovascular: Negative for chest pain and leg swelling.  Gastrointestinal:  Negative for abdominal pain and abdominal distention.  Endocrine: Negative for cold intolerance.  Genitourinary: Negative for difficulty urinating.  Musculoskeletal: Negative for arthralgias.       Falls  Neurological: Positive for weakness. Negative for dizziness and tremors.  Hematological: Negative for adenopathy.  Psychiatric/Behavioral: Positive for confusion.       Dementia    Immunization History  Administered Date(s) Administered  . Influenza Whole 06/03/2012, 06/17/2013  . Influenza-Unspecified 06/09/2014, 06/16/2015  . PPD Test 09/29/2012  . Pneumococcal Polysaccharide-23 09/03/1998  . Td 09/03/1998  . Zoster 09/04/2007   Pertinent  Health Maintenance Due  Topic Date Due  . FOOT EXAM  06/30/1939  .  OPHTHALMOLOGY EXAM  06/30/1939  . URINE MICROALBUMIN  06/30/1939  . PNA vac Low Risk Adult (2 of 2 - PCV13) 09/04/1999  . HEMOGLOBIN A1C  01/05/2013  . INFLUENZA VACCINE  04/03/2016  . DEXA SCAN  Completed   Fall Risk  01/19/2015 12/16/2013  Falls in the past year? Yes No  Number falls in past yr: 1 -  Injury with Fall? No -   Functional Status Survey: Is the patient deaf or have difficulty hearing?: Yes Does the patient have difficulty seeing, even when wearing glasses/contacts?: Yes Does the patient have difficulty concentrating, remembering, or making decisions?: Yes Does the patient have difficulty walking or climbing stairs?: Yes Does the patient have difficulty dressing or bathing?: Yes Does the patient have difficulty doing errands alone such as visiting a doctor's office or shopping?: Yes  Filed Vitals:   12/27/15 1539  BP: 104/60  Pulse: 87  Temp: 96.6 F (35.9 C)  TempSrc: Oral  Resp: 18  Weight: 137 lb (62.143 kg)  SpO2: 98%   Body mass index is 22.8 kg/(m^2). Physical Exam  Constitutional: She appears well-developed and well-nourished. No distress.  HENT:  Hearing aides  Eyes:  glasses  Cardiovascular: Normal rate, regular rhythm, normal  heart sounds and intact distal pulses.   Pulmonary/Chest: Effort normal and breath sounds normal.  Abdominal: Soft. Bowel sounds are normal.  Musculoskeletal: Normal range of motion.  Neurological: She is alert.  Skin: Skin is warm and dry.  Psychiatric: She has a normal mood and affect.  pleasant    Labs reviewed:  Recent Labs  05/19/15 08/05/15 11/11/15  NA 136* 137 132*  K 3.9 4.2 3.7  BUN 19 26* 26*  CREATININE 1.0 1.2* 1.5*    Recent Labs  05/19/15 08/05/15  AST 22 24  ALT 12 17  ALKPHOS 44 38    Recent Labs  05/19/15 08/05/15 11/11/15  WBC 4.7 4.3 5.0  HGB 12.7 12.9 11.3*  HCT 37 38 34*  PLT 124* 138* 128*   Lab Results  Component Value Date   TSH 0.16* 06/30/2015   Lab Results  Component Value Date   HGBA1C 5.1 07/08/2012   Lab Results  Component Value Date   CHOL 175 09/16/2014   HDL 43 09/16/2014   LDLCALC 82 09/16/2014   TRIG 249* 09/16/2014   CHOLHDL 3.5 07/09/2012    Assessment/Plan 1. Dementia, without behavioral disturbance -is progressing -she is losing her ability to ambulate gradually as this progresses -in memory care and getting adl assistance -is on namenda, turmeric, probiotic per family request to assist with this   2. AAA (abdominal aortic aneurysm) without rupture ([address]) -noted -would not pursue further imaging of this as she is not a good candidate for intervention (same with carotid artery imaging)  3. Hypothyroidism, unspecified hypothyroidism type -cont synthroid 38mcg daily before breakfast -should have TSH rechecked if last was 10/16 and abnormal (if not, need abstraction of labs)  4. Type 2 diabetes mellitus without complication, without long-term current use of insulin (HCC) -on diet only, needs hba1c 2x per year at least for monitoring unless family does not want labs at all  5. Essential hypertension -bp controlled with metoprolol, and was having orthostasis so discontinued  6. Depression - seems to be in  remission--not on meds at this time   7. Falls frequently -is on ca with D and additional D--last and only bone density I can locate was in 2000 and normal at that time -unsure how much benefit  she would get at this time when she is already having frequent falls if prolia or reclast were begun at this point--doubt she'd tolerate fosamax with her reflux  Family/ staff Communication: discussed with nursing Labs/tests ordered:  Needs tsh, hba1c if none in last 6 mos and family still wants her to get labs (appears so with labs in march)

## 2016-01-02 ENCOUNTER — Non-Acute Institutional Stay: Payer: Medicare Other | Admitting: Adult Health

## 2016-01-02 ENCOUNTER — Encounter: Payer: Self-pay | Admitting: Adult Health

## 2016-01-02 DIAGNOSIS — B351 Tinea unguium: Secondary | ICD-10-CM

## 2016-01-02 DIAGNOSIS — S31829A Unspecified open wound of left buttock, initial encounter: Secondary | ICD-10-CM

## 2016-01-02 NOTE — Progress Notes (Signed)
Patient ID: Lorraine [name], female   DOB: 04-18-1927, 82 y.o.   MRN: [id number]  Location:   [facility]    Place of Service:   Memory care AL status Provider:   Cindi Carbon, ANP [place] [phone]   Hollace Kinnier, DO  Patient Care Team: Gayland Curry, DO as PCP - General (Geriatric Medicine) Mardene Celeste, NP as Nurse Practitioner (Geriatric Medicine) Neldon Mc, MD as Consulting Physician (General Surgery) Particia Nearing, MD as Consulting Physician (Dermatology) Monna Fam, MD as Consulting Physician (Ophthalmology) Garlan Fair, MD as Consulting Physician (Gastroenterology) Kary Kos, MD as Consulting Physician (Neurosurgery) Elsie Saas, MD as Consulting Physician (Orthopedic Surgery) Selinda Orion, MD as Consulting Physician (Obstetrics and Gynecology) [facility] Rolm Bookbinder, MD as Consulting Physician (Dermatology)  Extended Emergency Contact Information Primary Emergency Contact: [place] of [place] Phone: [phone] Mobile Phone: [phone] Relation: Daughter Secondary Emergency Contact: Hubert Azure, [place] [address] Johnnette Litter of [place] Phone: [phone] Work Phone: [phone] Mobile Phone: [phone] Relation: Other  Code Status:  DNR Goals of care: Advanced Directive information Advanced Directives 12/27/2015  Does patient have an advance directive? Yes  Type of Advance Directive Out of facility DNR (pink MOST or yellow form);[place];Living will  Copy of advanced directive(s) in chart? Yes  Pre-existing out of facility DNR order (yellow form or pink MOST form) Yellow form placed in chart (order not valid for inpatient use);Pink MOST form placed in chart (order not valid for inpatient use)     Chief Complaint  Patient presents with  . Acute Visit    rash, nail dystrophy    HPI:  Pt is a 82 y.o. female seen today for an  acute visit for rash to her buttocks and nail dystrophy.    1. Onychomycosis -first noted today with yellow thumb nail on the right, no pain or drainage -gets her nails done on a regular basis  2. Wound of buttock, left, initial encounter -first noted two weeks ago, has received mycolog cream for 1 week -noted to left buttock    Past Medical History  Diagnosis Date  . Cataracts, bilateral   . Arthritis   . Skin cancer   . Diabetes mellitus without complication ([place])   . Other specified diseases of blood and blood-forming organs(289.89)     s/p Chelation treatment  . Ventral hernia, unspecified, without mention of obstruction or gangrene   . Diverticulosis of colon (without mention of hemorrhage)   . Diffuse cystic mastopathy 1997    s/p right breast bx, benign  . Digestive-genital tract fistula, female   . Other psoriasis   . Other lichen, not elsewhere classified [id number]    Lichen sclerosis perineum/perianal  . Alopecia areata   . Lumbago 2001    lumbar radiculopathy L3, spondylosis L2-3, 3-4. dx Dr.Cram  . Dizziness and giddiness     benign positional vertigo  . Insomnia, unspecified   . Other malaise and fatigue   . Abnormality of gait 2008    re: metoclopramide.   Marland Kitchen Dysphagia, pharyngoesophageal phase   . Closed fracture of lateral malleolus   . Hyperlipidemia   . Unspecified constipation   . Thyroid disease   . GERD (gastroesophageal reflux disease)   . B12 deficiency   . Anal fissure   . Hypertension   . Depression   . Mild cognitive impairment with memory loss 12/16/2013  Past Surgical History  Procedure Laterality Date  . Appendectomy/ovarian cystectomy  1950s  . Breast lumpectomy  1997  . Hernia repair    . Vaginal hysterectomy  1998  . Tonsillectomy and adenoidectomy  1942  . Cystectomy  1950's    brachial cleft  . Bunionectomy Bilateral 1975    twice  . Cataract extraction w/ intraocular lens  implant, bilateral  1990  . Breast biopsy  1997     right benign  . Colectomy  11/2007    Sigmoid w/closure of fistula and repair of umbilical hernia    Allergies  Allergen Reactions  . Codeine     Unknown   . Erythromycin     Unknown       Medication List       This list is accurate as of: 01/02/16  2:46 PM.  Always use your most recent med list.               acetaminophen 325 MG tablet  Commonly known as:  TYLENOL  Take 650 mg by mouth. Take two tablets twice daily as needed to reduce back pain     Calcium Carbonate-Vitamin D 600-400 MG-UNIT chew tablet  Chew 1 tablet by mouth 2 (two) times daily.     cyanocobalamin 1000 MCG tablet  Take 100 mcg by mouth daily.     ENSURE PO  Take by mouth. Drink one can M-W-F     Fish Oil 1000 MG Cpdr  Take 1,000 mg by mouth daily.     hydrocortisone cream 1 %  Apply 1 application topically 2 (two) times daily. To chest as needed     ipratropium-albuterol 0.5-2.5 (3) MG/3ML Soln  Commonly known as:  DUONEB  Take 3 mLs by nebulization every 6 (six) hours as needed.     levothyroxine 75 MCG tablet  Commonly known as:  SYNTHROID, LEVOTHROID  Take 75 mcg by mouth daily before breakfast.     memantine 10 MG tablet  Commonly known as:  NAMENDA  Take 10 mg by mouth 2 (two) times daily.     multivitamin-iron-minerals-folic acid chewable tablet  Chew 1 tablet by mouth daily.     pantoprazole 40 MG tablet  Commonly known as:  PROTONIX  Take 40 mg by mouth daily.     polyethylene glycol powder powder  Commonly known as:  GLYCOLAX/MIRALAX  Take 17 g by mouth daily.     RISA-BID PROBIOTIC Tabs  Take 1 tablet by mouth daily.     Turmeric 500 MG Tabs  Take 1 tablet by mouth daily.     Vitamin D3 2000 units Tabs  Take 1 tablet by mouth daily.        Review of Systems  Constitutional: Negative for fever, chills, activity change and appetite change.  Skin: Positive for color change (nail) and wound.    Immunization History  Administered Date(s) Administered  .  Influenza Whole 06/03/2012, 06/17/2013  . Influenza-Unspecified 06/09/2014, 06/16/2015  . PPD Test 09/29/2012  . Pneumococcal Polysaccharide-23 09/03/1998  . Td 09/03/1998  . Zoster 09/04/2007   Pertinent  Health Maintenance Due  Topic Date Due  . FOOT EXAM  06/30/1939  . OPHTHALMOLOGY EXAM  06/30/1939  . URINE MICROALBUMIN  06/30/1939  . PNA vac Low Risk Adult (2 of 2 - PCV13) 09/04/1999  . HEMOGLOBIN A1C  01/05/2013  . INFLUENZA VACCINE  04/03/2016  . DEXA SCAN  Completed   Fall Risk  01/19/2015 12/16/2013  Falls in the past  year? Yes No  Number falls in past yr: 1 -  Injury with Fall? No -   Functional Status Survey:    Filed Vitals:   01/02/16 1441  BP: 107/51  Pulse: 79   There is no weight on file to calculate BMI. Physical Exam  Constitutional: No distress.  Skin: Skin is warm and dry. No rash noted. She is not diaphoretic. No erythema.  Small open area 1 x 1 cm to left buttock, 100% pink tissue, no surrounding erythema or drainage. Thumb nail with yellow discoloration, brownish color noted to the left side, no drainage or tenderness.  Nail lifting from the bed.      Labs reviewed:  Recent Labs  05/19/15 08/05/15 11/11/15  NA 136* 137 132*  K 3.9 4.2 3.7  BUN 19 26* 26*  CREATININE 1.0 1.2* 1.5*    Recent Labs  05/19/15 08/05/15  AST 22 24  ALT 12 17  ALKPHOS 44 38    Recent Labs  05/19/15 08/05/15 11/11/15  WBC 4.7 4.3 5.0  HGB 12.7 12.9 11.3*  HCT 37 38 34*  PLT 124* 138* 128*   Lab Results  Component Value Date   TSH 0.16* 06/30/2015   Lab Results  Component Value Date   HGBA1C 5.1 07/08/2012   Lab Results  Component Value Date   CHOL 175 09/16/2014   HDL 43 09/16/2014   LDLCALC 82 09/16/2014   TRIG 249* 09/16/2014   CHOLHDL 3.5 07/09/2012    Significant Diagnostic Results in last 30 days:  No results found.  Assessment/Plan 1. Onychomycosis -ciclopirox nail solution qhs for 4 months then re eval -continue to monitor, if  worsening would refer to derm  2. Wound of buttock, left, initial encounter -may be due to pressure but is not technically on a pressure point, also had recent yeast infection -does not appear to be actively infected at this time -resident's daughter brought homeopathic cream from home, may use this has a barrier BID for 1 week and if no improvement would try a foam dressing  -should shift weight in the chair and use a chair cushion    Family/ staff Communication: discussed with daughter  Labs/tests ordered:  NA  Cindi Carbon, [place] [phone]

## 2016-01-06 DIAGNOSIS — D649 Anemia, unspecified: Secondary | ICD-10-CM | POA: Diagnosis not present

## 2016-01-06 DIAGNOSIS — E039 Hypothyroidism, unspecified: Secondary | ICD-10-CM | POA: Diagnosis not present

## 2016-01-06 LAB — BASIC METABOLIC PANEL
Creatinine: 1.1 mg/dL (ref <=1.1)
Glucose: 141 mg/dL
Potassium: 3.8 mmol/L (ref 3.4–5.3)
Sodium: 140 mmol/L (ref 137–147)

## 2016-01-06 LAB — TSH: TSH: 0.4 u[IU]/mL — AB (ref <=5.90)

## 2016-01-11 DIAGNOSIS — R4189 Other symptoms and signs involving cognitive functions and awareness: Secondary | ICD-10-CM | POA: Diagnosis not present

## 2016-01-11 DIAGNOSIS — R4181 Age-related cognitive decline: Secondary | ICD-10-CM | POA: Diagnosis not present

## 2016-01-11 DIAGNOSIS — F0280 Dementia in other diseases classified elsewhere without behavioral disturbance: Secondary | ICD-10-CM | POA: Diagnosis not present

## 2016-01-11 DIAGNOSIS — L98491 Non-pressure chronic ulcer of skin of other sites limited to breakdown of skin: Secondary | ICD-10-CM | POA: Diagnosis not present

## 2016-01-11 DIAGNOSIS — Y93E8 Activity, other personal hygiene: Secondary | ICD-10-CM | POA: Diagnosis not present

## 2016-01-13 DIAGNOSIS — R4181 Age-related cognitive decline: Secondary | ICD-10-CM | POA: Diagnosis not present

## 2016-01-13 DIAGNOSIS — F0280 Dementia in other diseases classified elsewhere without behavioral disturbance: Secondary | ICD-10-CM | POA: Diagnosis not present

## 2016-01-13 DIAGNOSIS — Y93E8 Activity, other personal hygiene: Secondary | ICD-10-CM | POA: Diagnosis not present

## 2016-01-13 DIAGNOSIS — R4189 Other symptoms and signs involving cognitive functions and awareness: Secondary | ICD-10-CM | POA: Diagnosis not present

## 2016-01-13 DIAGNOSIS — L98491 Non-pressure chronic ulcer of skin of other sites limited to breakdown of skin: Secondary | ICD-10-CM | POA: Diagnosis not present

## 2016-01-16 DIAGNOSIS — L98491 Non-pressure chronic ulcer of skin of other sites limited to breakdown of skin: Secondary | ICD-10-CM | POA: Diagnosis not present

## 2016-01-16 DIAGNOSIS — Y93E8 Activity, other personal hygiene: Secondary | ICD-10-CM | POA: Diagnosis not present

## 2016-01-16 DIAGNOSIS — R4181 Age-related cognitive decline: Secondary | ICD-10-CM | POA: Diagnosis not present

## 2016-01-16 DIAGNOSIS — R4189 Other symptoms and signs involving cognitive functions and awareness: Secondary | ICD-10-CM | POA: Diagnosis not present

## 2016-01-16 DIAGNOSIS — F0280 Dementia in other diseases classified elsewhere without behavioral disturbance: Secondary | ICD-10-CM | POA: Diagnosis not present

## 2016-01-17 DIAGNOSIS — L98491 Non-pressure chronic ulcer of skin of other sites limited to breakdown of skin: Secondary | ICD-10-CM | POA: Diagnosis not present

## 2016-01-17 DIAGNOSIS — Y93E8 Activity, other personal hygiene: Secondary | ICD-10-CM | POA: Diagnosis not present

## 2016-01-17 DIAGNOSIS — R4181 Age-related cognitive decline: Secondary | ICD-10-CM | POA: Diagnosis not present

## 2016-01-17 DIAGNOSIS — F0280 Dementia in other diseases classified elsewhere without behavioral disturbance: Secondary | ICD-10-CM | POA: Diagnosis not present

## 2016-01-17 DIAGNOSIS — R4189 Other symptoms and signs involving cognitive functions and awareness: Secondary | ICD-10-CM | POA: Diagnosis not present

## 2016-01-19 DIAGNOSIS — R4189 Other symptoms and signs involving cognitive functions and awareness: Secondary | ICD-10-CM | POA: Diagnosis not present

## 2016-01-19 DIAGNOSIS — L98491 Non-pressure chronic ulcer of skin of other sites limited to breakdown of skin: Secondary | ICD-10-CM | POA: Diagnosis not present

## 2016-01-19 DIAGNOSIS — F0280 Dementia in other diseases classified elsewhere without behavioral disturbance: Secondary | ICD-10-CM | POA: Diagnosis not present

## 2016-01-19 DIAGNOSIS — R4181 Age-related cognitive decline: Secondary | ICD-10-CM | POA: Diagnosis not present

## 2016-01-19 DIAGNOSIS — Y93E8 Activity, other personal hygiene: Secondary | ICD-10-CM | POA: Diagnosis not present

## 2016-01-23 DIAGNOSIS — R4189 Other symptoms and signs involving cognitive functions and awareness: Secondary | ICD-10-CM | POA: Diagnosis not present

## 2016-01-23 DIAGNOSIS — F0280 Dementia in other diseases classified elsewhere without behavioral disturbance: Secondary | ICD-10-CM | POA: Diagnosis not present

## 2016-01-23 DIAGNOSIS — R4181 Age-related cognitive decline: Secondary | ICD-10-CM | POA: Diagnosis not present

## 2016-01-23 DIAGNOSIS — L98491 Non-pressure chronic ulcer of skin of other sites limited to breakdown of skin: Secondary | ICD-10-CM | POA: Diagnosis not present

## 2016-01-23 DIAGNOSIS — Y93E8 Activity, other personal hygiene: Secondary | ICD-10-CM | POA: Diagnosis not present

## 2016-01-24 DIAGNOSIS — Y93E8 Activity, other personal hygiene: Secondary | ICD-10-CM | POA: Diagnosis not present

## 2016-01-24 DIAGNOSIS — L98491 Non-pressure chronic ulcer of skin of other sites limited to breakdown of skin: Secondary | ICD-10-CM | POA: Diagnosis not present

## 2016-01-24 DIAGNOSIS — R4181 Age-related cognitive decline: Secondary | ICD-10-CM | POA: Diagnosis not present

## 2016-01-24 DIAGNOSIS — R4189 Other symptoms and signs involving cognitive functions and awareness: Secondary | ICD-10-CM | POA: Diagnosis not present

## 2016-01-24 DIAGNOSIS — F0280 Dementia in other diseases classified elsewhere without behavioral disturbance: Secondary | ICD-10-CM | POA: Diagnosis not present

## 2016-01-26 DIAGNOSIS — Y93E8 Activity, other personal hygiene: Secondary | ICD-10-CM | POA: Diagnosis not present

## 2016-01-26 DIAGNOSIS — F0280 Dementia in other diseases classified elsewhere without behavioral disturbance: Secondary | ICD-10-CM | POA: Diagnosis not present

## 2016-01-26 DIAGNOSIS — R4189 Other symptoms and signs involving cognitive functions and awareness: Secondary | ICD-10-CM | POA: Diagnosis not present

## 2016-01-26 DIAGNOSIS — L98491 Non-pressure chronic ulcer of skin of other sites limited to breakdown of skin: Secondary | ICD-10-CM | POA: Diagnosis not present

## 2016-01-26 DIAGNOSIS — R4181 Age-related cognitive decline: Secondary | ICD-10-CM | POA: Diagnosis not present

## 2016-01-28 DIAGNOSIS — D72819 Decreased white blood cell count, unspecified: Secondary | ICD-10-CM | POA: Diagnosis not present

## 2016-01-28 LAB — CBC AND DIFFERENTIAL
HEMATOCRIT: 38 % (ref 36–46)
HEMOGLOBIN: 11.7 g/dL — AB (ref 12.0–16.0)
PLATELETS: 135 10*3/uL — AB (ref 150–399)
WBC: 4 10*3/mL

## 2016-01-29 ENCOUNTER — Encounter: Payer: Self-pay | Admitting: Internal Medicine

## 2016-01-29 DIAGNOSIS — R296 Repeated falls: Secondary | ICD-10-CM | POA: Insufficient documentation

## 2016-01-31 DIAGNOSIS — Y93E8 Activity, other personal hygiene: Secondary | ICD-10-CM | POA: Diagnosis not present

## 2016-01-31 DIAGNOSIS — F0280 Dementia in other diseases classified elsewhere without behavioral disturbance: Secondary | ICD-10-CM | POA: Diagnosis not present

## 2016-01-31 DIAGNOSIS — R4189 Other symptoms and signs involving cognitive functions and awareness: Secondary | ICD-10-CM | POA: Diagnosis not present

## 2016-01-31 DIAGNOSIS — L98491 Non-pressure chronic ulcer of skin of other sites limited to breakdown of skin: Secondary | ICD-10-CM | POA: Diagnosis not present

## 2016-01-31 DIAGNOSIS — R4181 Age-related cognitive decline: Secondary | ICD-10-CM | POA: Diagnosis not present

## 2016-02-01 DIAGNOSIS — F0280 Dementia in other diseases classified elsewhere without behavioral disturbance: Secondary | ICD-10-CM | POA: Diagnosis not present

## 2016-02-01 DIAGNOSIS — R4181 Age-related cognitive decline: Secondary | ICD-10-CM | POA: Diagnosis not present

## 2016-02-01 DIAGNOSIS — R4189 Other symptoms and signs involving cognitive functions and awareness: Secondary | ICD-10-CM | POA: Diagnosis not present

## 2016-02-01 DIAGNOSIS — L98491 Non-pressure chronic ulcer of skin of other sites limited to breakdown of skin: Secondary | ICD-10-CM | POA: Diagnosis not present

## 2016-02-01 DIAGNOSIS — Y93E8 Activity, other personal hygiene: Secondary | ICD-10-CM | POA: Diagnosis not present

## 2016-02-15 DIAGNOSIS — I951 Orthostatic hypotension: Secondary | ICD-10-CM | POA: Diagnosis not present

## 2016-02-15 DIAGNOSIS — Z9181 History of falling: Secondary | ICD-10-CM | POA: Diagnosis not present

## 2016-02-15 DIAGNOSIS — M6281 Muscle weakness (generalized): Secondary | ICD-10-CM | POA: Diagnosis not present

## 2016-02-15 DIAGNOSIS — G3184 Mild cognitive impairment, so stated: Secondary | ICD-10-CM | POA: Diagnosis not present

## 2016-02-15 DIAGNOSIS — R2689 Other abnormalities of gait and mobility: Secondary | ICD-10-CM | POA: Diagnosis not present

## 2016-02-15 DIAGNOSIS — R278 Other lack of coordination: Secondary | ICD-10-CM | POA: Diagnosis not present

## 2016-02-17 DIAGNOSIS — Z9181 History of falling: Secondary | ICD-10-CM | POA: Diagnosis not present

## 2016-02-17 DIAGNOSIS — M6281 Muscle weakness (generalized): Secondary | ICD-10-CM | POA: Diagnosis not present

## 2016-02-17 DIAGNOSIS — G3184 Mild cognitive impairment, so stated: Secondary | ICD-10-CM | POA: Diagnosis not present

## 2016-02-17 DIAGNOSIS — R2689 Other abnormalities of gait and mobility: Secondary | ICD-10-CM | POA: Diagnosis not present

## 2016-02-17 DIAGNOSIS — I951 Orthostatic hypotension: Secondary | ICD-10-CM | POA: Diagnosis not present

## 2016-02-17 DIAGNOSIS — R278 Other lack of coordination: Secondary | ICD-10-CM | POA: Diagnosis not present

## 2016-02-20 DIAGNOSIS — R278 Other lack of coordination: Secondary | ICD-10-CM | POA: Diagnosis not present

## 2016-02-20 DIAGNOSIS — R2689 Other abnormalities of gait and mobility: Secondary | ICD-10-CM | POA: Diagnosis not present

## 2016-02-20 DIAGNOSIS — I951 Orthostatic hypotension: Secondary | ICD-10-CM | POA: Diagnosis not present

## 2016-02-20 DIAGNOSIS — Z9181 History of falling: Secondary | ICD-10-CM | POA: Diagnosis not present

## 2016-02-20 DIAGNOSIS — G3184 Mild cognitive impairment, so stated: Secondary | ICD-10-CM | POA: Diagnosis not present

## 2016-02-20 DIAGNOSIS — M6281 Muscle weakness (generalized): Secondary | ICD-10-CM | POA: Diagnosis not present

## 2016-02-23 DIAGNOSIS — G3184 Mild cognitive impairment, so stated: Secondary | ICD-10-CM | POA: Diagnosis not present

## 2016-02-23 DIAGNOSIS — M6281 Muscle weakness (generalized): Secondary | ICD-10-CM | POA: Diagnosis not present

## 2016-02-23 DIAGNOSIS — Z9181 History of falling: Secondary | ICD-10-CM | POA: Diagnosis not present

## 2016-02-23 DIAGNOSIS — I951 Orthostatic hypotension: Secondary | ICD-10-CM | POA: Diagnosis not present

## 2016-02-23 DIAGNOSIS — R2689 Other abnormalities of gait and mobility: Secondary | ICD-10-CM | POA: Diagnosis not present

## 2016-02-23 DIAGNOSIS — R278 Other lack of coordination: Secondary | ICD-10-CM | POA: Diagnosis not present

## 2016-02-27 DIAGNOSIS — Z9181 History of falling: Secondary | ICD-10-CM | POA: Diagnosis not present

## 2016-02-27 DIAGNOSIS — R278 Other lack of coordination: Secondary | ICD-10-CM | POA: Diagnosis not present

## 2016-02-27 DIAGNOSIS — M6281 Muscle weakness (generalized): Secondary | ICD-10-CM | POA: Diagnosis not present

## 2016-02-27 DIAGNOSIS — R2689 Other abnormalities of gait and mobility: Secondary | ICD-10-CM | POA: Diagnosis not present

## 2016-02-27 DIAGNOSIS — G3184 Mild cognitive impairment, so stated: Secondary | ICD-10-CM | POA: Diagnosis not present

## 2016-02-27 DIAGNOSIS — I951 Orthostatic hypotension: Secondary | ICD-10-CM | POA: Diagnosis not present

## 2016-02-28 DIAGNOSIS — M79671 Pain in right foot: Secondary | ICD-10-CM | POA: Diagnosis not present

## 2016-02-28 DIAGNOSIS — L89892 Pressure ulcer of other site, stage 2: Secondary | ICD-10-CM | POA: Diagnosis not present

## 2016-02-28 DIAGNOSIS — B351 Tinea unguium: Secondary | ICD-10-CM | POA: Diagnosis not present

## 2016-02-28 DIAGNOSIS — M79672 Pain in left foot: Secondary | ICD-10-CM | POA: Diagnosis not present

## 2016-03-01 DIAGNOSIS — I951 Orthostatic hypotension: Secondary | ICD-10-CM | POA: Diagnosis not present

## 2016-03-01 DIAGNOSIS — Z9181 History of falling: Secondary | ICD-10-CM | POA: Diagnosis not present

## 2016-03-01 DIAGNOSIS — M6281 Muscle weakness (generalized): Secondary | ICD-10-CM | POA: Diagnosis not present

## 2016-03-01 DIAGNOSIS — R278 Other lack of coordination: Secondary | ICD-10-CM | POA: Diagnosis not present

## 2016-03-01 DIAGNOSIS — G3184 Mild cognitive impairment, so stated: Secondary | ICD-10-CM | POA: Diagnosis not present

## 2016-03-01 DIAGNOSIS — R2689 Other abnormalities of gait and mobility: Secondary | ICD-10-CM | POA: Diagnosis not present

## 2016-03-08 ENCOUNTER — Non-Acute Institutional Stay: Payer: Medicare Other | Admitting: Adult Health

## 2016-03-08 DIAGNOSIS — E119 Type 2 diabetes mellitus without complications: Secondary | ICD-10-CM | POA: Diagnosis not present

## 2016-03-08 DIAGNOSIS — E039 Hypothyroidism, unspecified: Secondary | ICD-10-CM | POA: Diagnosis not present

## 2016-03-08 DIAGNOSIS — K5909 Other constipation: Secondary | ICD-10-CM

## 2016-03-08 DIAGNOSIS — B351 Tinea unguium: Secondary | ICD-10-CM

## 2016-03-08 DIAGNOSIS — K219 Gastro-esophageal reflux disease without esophagitis: Secondary | ICD-10-CM

## 2016-03-08 DIAGNOSIS — F0390 Unspecified dementia without behavioral disturbance: Secondary | ICD-10-CM

## 2016-03-08 NOTE — Progress Notes (Signed)
Patient ID: Lorraine [name], female   DOB: 01/28/1927, 82 y.o.   MRN: [id number]  Location:   [facility]   Place of Service:  ALF (13) Provider:   Cindi Carbon, ANP [place] [phone]   REED, Jonelle Sidle, DO  Patient Care Team: Gayland Curry, DO as PCP - General (Geriatric Medicine) Mardene Celeste, NP as Nurse Practitioner (Geriatric Medicine) Neldon Mc, MD as Consulting Physician (General Surgery) Particia Nearing, MD as Consulting Physician (Dermatology) Monna Fam, MD as Consulting Physician (Ophthalmology) Garlan Fair, MD as Consulting Physician (Gastroenterology) Kary Kos, MD as Consulting Physician (Neurosurgery) Elsie Saas, MD as Consulting Physician (Orthopedic Surgery) Selinda Orion, MD as Consulting Physician (Obstetrics and Gynecology) [facility] Rolm Bookbinder, MD as Consulting Physician (Dermatology)  Extended Emergency Contact Information Primary Emergency Contact: [place] of [place] Phone: [phone] Mobile Phone: [phone] Relation: Daughter Secondary Emergency Contact: Hubert Azure, [place] [address] Johnnette Litter of [place] Phone: [phone] Work Phone: [phone] Mobile Phone: [phone] Relation: Other  Code Status:  DNR Goals of care: Advanced Directive information Advanced Directives 12/27/2015  Does patient have an advance directive? Yes  Type of Advance Directive Out of facility DNR (pink MOST or yellow form);[place];Living will  Copy of advanced directive(s) in chart? Yes  Pre-existing out of facility DNR order (yellow form or pink MOST form) Yellow form placed in chart (order not valid for inpatient use);Pink MOST form placed in chart (order not valid for inpatient use)     Chief Complaint  Patient presents with  . Medical Management of Chronic Issues    HPI:  Pt is a 82 y.o. female seen today for medical management  of chronic diseases.  She has a hx of progressive memory loss and resides in the memory care unit at [facility]. She has caretakers during the day to help her with ADL's.    1. Dementia, without behavioral disturbance Maintained on Memantine, Risa-Bid, and Tumeric. Able to provide appropriate answers to questions and maintain conversation with minimal repetition. No reports of inappropriate behaviors from staff.  2. Tinea unguium Continues on 4 month course of Ciclopirox applied daily to right thumb. No other complaints of nail discoloration to other nails.  3. Hypothyroidism, unspecified hypothyroidism type Denies complaints of heat/cold intolerance, constipation, agitation, fatigue. Maintained on Synthroid 62.5 mcg daily.  4. Gastroesophageal reflux disease without esophagitis Currently maintained on Protonix daily. Denies reports of relfux or indigestion. Weight stable.  5. Other constipation Denies complaints of constipation. Reports daily to every -other-day bowel movements without straining or blood in stool. Functional status: ambulatory with a walker  6. Diabetes Mellitus No reports in change in polydipsia, polyphagia, polyuria. Weight stable.  Past Medical History  Diagnosis Date  . Cataracts, bilateral   . Arthritis   . Skin cancer   . Diabetes mellitus without complication ([place])   . Other specified diseases of blood and blood-forming organs(289.89)     s/p Chelation treatment  . Ventral hernia, unspecified, without mention of obstruction or gangrene   . Diverticulosis of colon (without mention of hemorrhage)   . Diffuse cystic mastopathy 1997    s/p right breast bx, benign  . Digestive-genital tract fistula, female   . Other psoriasis   . Other lichen, not elsewhere classified [id number]    Lichen sclerosis perineum/perianal  . Alopecia areata   . Lumbago 2001    lumbar radiculopathy L3, spondylosis  L2-3, 3-4. dx Dr.Cram  . Dizziness and giddiness     benign positional  vertigo  . Insomnia, unspecified   . Other malaise and fatigue   . Abnormality of gait 2008    re: metoclopramide.   Marland Kitchen Dysphagia, pharyngoesophageal phase   . Closed fracture of lateral malleolus   . Hyperlipidemia   . Unspecified constipation   . Thyroid disease   . GERD (gastroesophageal reflux disease)   . B12 deficiency   . Anal fissure   . Hypertension   . Depression   . Mild cognitive impairment with memory loss 12/16/2013   Past Surgical History  Procedure Laterality Date  . Appendectomy/ovarian cystectomy  1950s  . Breast lumpectomy  1997  . Hernia repair    . Vaginal hysterectomy  1998  . Tonsillectomy and adenoidectomy  1942  . Cystectomy  1950's    brachial cleft  . Bunionectomy Bilateral 1975    twice  . Cataract extraction w/ intraocular lens  implant, bilateral  1990  . Breast biopsy  1997    right benign  . Colectomy  11/2007    Sigmoid w/closure of fistula and repair of umbilical hernia    Allergies  Allergen Reactions  . Codeine     Unknown   . Erythromycin     Unknown       Medication List       This list is accurate as of: 03/08/16  4:42 PM.  Always use your most recent med list.               acetaminophen 325 MG tablet  Commonly known as:  TYLENOL  Take 650 mg by mouth. Take two tablets twice daily as needed to reduce back pain     Calcium Carbonate-Vitamin D 600-400 MG-UNIT chew tablet  Chew 1 tablet by mouth 2 (two) times daily.     cyanocobalamin 1000 MCG tablet  Take 100 mcg by mouth daily.     ENSURE PO  Take by mouth. Drink one can M-W-F     Fish Oil 1000 MG Cpdr  Take 1,000 mg by mouth daily.     hydrocortisone cream 1 %  Apply 1 application topically 2 (two) times daily. To chest as needed     ipratropium-albuterol 0.5-2.5 (3) MG/3ML Soln  Commonly known as:  DUONEB  Take 3 mLs by nebulization every 6 (six) hours as needed.     levothyroxine 75 MCG tablet  Commonly known as:  SYNTHROID, LEVOTHROID  Take 62.5 mcg by  mouth daily before breakfast.     memantine 10 MG tablet  Commonly known as:  NAMENDA  Take 10 mg by mouth 2 (two) times daily.     multivitamin-iron-minerals-folic acid chewable tablet  Chew 1 tablet by mouth daily.     pantoprazole 40 MG tablet  Commonly known as:  PROTONIX  Take 40 mg by mouth daily.     polyethylene glycol powder powder  Commonly known as:  GLYCOLAX/MIRALAX  Take 17 g by mouth daily.     RISA-BID PROBIOTIC Tabs  Take 1 tablet by mouth daily.     Turmeric 500 MG Tabs  Take 1 tablet by mouth daily.     Vitamin D3 2000 units Tabs  Take 1 tablet by mouth daily.        Review of Systems  Constitutional: Negative for fever, chills, activity change, appetite change and fatigue. Unexpected weight change: Weight stable.  HENT: Negative.  Negative for congestion.  Respiratory: Negative for cough, shortness of breath and wheezing.   Cardiovascular: Negative for chest pain, palpitations and leg swelling.  Gastrointestinal: Negative for abdominal pain, diarrhea, constipation, blood in stool and abdominal distention.  Endocrine: Negative for cold intolerance, heat intolerance, polydipsia, polyphagia and polyuria.  Genitourinary: Negative for dysuria, urgency, frequency, difficulty urinating and vaginal pain.  Musculoskeletal: Positive for back pain (h/o, denies right now), arthralgias and gait problem. Negative for myalgias and joint swelling.  Skin: Negative for color change and pallor.       Denies issues with nail beds.  Neurological: Negative for dizziness, facial asymmetry and headaches.  Psychiatric/Behavioral: Positive for confusion. Negative for behavioral problems and agitation. The patient is not nervous/anxious.     Immunization History  Administered Date(s) Administered  . Influenza Whole 06/03/2012, 06/17/2013  . Influenza-Unspecified 06/09/2014, 06/16/2015  . PPD Test 09/29/2012  . Pneumococcal Polysaccharide-23 09/03/1998  . Td 09/03/1998  .  Zoster 09/04/2007   Pertinent  Health Maintenance Due  Topic Date Due  . FOOT EXAM  06/30/1939  . OPHTHALMOLOGY EXAM  06/30/1939  . URINE MICROALBUMIN  06/30/1939  . PNA vac Low Risk Adult (2 of 2 - PCV13) 09/04/1999  . HEMOGLOBIN A1C  01/05/2013  . INFLUENZA VACCINE  04/03/2016  . DEXA SCAN  Completed   Fall Risk  01/19/2015 12/16/2013  Falls in the past year? Yes No  Number falls in past yr: 1 -  Injury with Fall? No -   Functional Status Survey:    Filed Vitals:   03/08/16 1540  BP: 137/67  Pulse: 77  Temp: 97.4 F (36.3 C)  Resp: 18  Weight: 142 lb (64.411 kg)  SpO2: 93%   Body mass index is 23.63 kg/(m^2). Physical Exam  Constitutional: She appears well-developed and well-nourished. No distress.  HENT:  Has alopecia  Eyes: Pupils are equal, round, and reactive to light.  Neck: Normal range of motion. Neck supple. No JVD present. No thyromegaly present.  Cardiovascular: Normal rate, regular rhythm, normal heart sounds and intact distal pulses.   No murmur heard. Pulmonary/Chest: Effort normal and breath sounds normal.  Abdominal: Soft. Bowel sounds are normal. She exhibits no distension.  Musculoskeletal: She exhibits no edema or tenderness.  Neurological: She is alert.  Oriented to self, place, and situation, not time. Able to follow commands and answer questions. Needs redirection as forgets what she is answering at times.  Skin: Skin is warm and dry. She is not diaphoretic.  Right thumb without discoloration. Some thickening noted. Overall improved.  Psychiatric: She has a normal mood and affect. Her behavior is normal.  Nursing note and vitals reviewed.   Labs reviewed:  Recent Labs  05/19/15 08/05/15 11/11/15 01/06/16  NA 136* 137 132* 140  K 3.9 4.2 3.7 3.8  BUN 19 26* 26*  --   CREATININE 1.0 1.2* 1.5* 1.1    Recent Labs  05/19/15 08/05/15  AST 22 24  ALT 12 17  ALKPHOS 44 38    Recent Labs  08/05/15 11/11/15 01/28/16  WBC 4.3 5.0 4.0    HGB 12.9 11.3* 11.7*  HCT 38 34* 38  PLT 138* 128* 135*   Lab Results  Component Value Date   TSH 0.40* 01/06/2016   Lab Results  Component Value Date   HGBA1C 5.1 07/08/2012   Lab Results  Component Value Date   CHOL 175 09/16/2014   HDL 43 09/16/2014   LDLCALC 82 09/16/2014   TRIG 249* 09/16/2014   CHOLHDL 3.5 07/09/2012  Significant Diagnostic Results in last 30 days:  No results found.  Assessment/Plan 1. Dementia, without behavioral disturbance Continues to feed herself, remains verbal and ambulatory -continue namenda  2. Tinea unguium Improved since May. -Continue daily application of Ciclopirox to affected nail for remainder of 4 month tx  3. Hypothyroidism, unspecified hypothyroidism type Remains asymptomatic. Weight stable. TSH 01/06/16 0.40 -Continue Synthroid  62.5 mcg daily -Monitor periodically  4. Gastroesophageal reflux disease without esophagitis Stable on current therapy. -Continue Protonix  5. Other constipation Eliminating regularly without straining. -Continue Miralax daily  6. Diabetes Mellitus A1C 06/30/15  5.3 Not on meds, diet controlled -Continue to monitor yearly.  Family/ staff Communication: discussed with resident and caretaker  Labs/tests ordered:     Cindi Carbon, [place] [phone]

## 2016-03-13 DIAGNOSIS — M25871 Other specified joint disorders, right ankle and foot: Secondary | ICD-10-CM | POA: Diagnosis not present

## 2016-03-13 DIAGNOSIS — R2689 Other abnormalities of gait and mobility: Secondary | ICD-10-CM | POA: Diagnosis not present

## 2016-03-13 DIAGNOSIS — M70872 Other soft tissue disorders related to use, overuse and pressure, left ankle and foot: Secondary | ICD-10-CM | POA: Diagnosis not present

## 2016-03-13 DIAGNOSIS — M79671 Pain in right foot: Secondary | ICD-10-CM | POA: Diagnosis not present

## 2016-03-13 DIAGNOSIS — M79672 Pain in left foot: Secondary | ICD-10-CM | POA: Diagnosis not present

## 2016-03-13 DIAGNOSIS — M70871 Other soft tissue disorders related to use, overuse and pressure, right ankle and foot: Secondary | ICD-10-CM | POA: Diagnosis not present

## 2016-03-13 DIAGNOSIS — M25872 Other specified joint disorders, left ankle and foot: Secondary | ICD-10-CM | POA: Diagnosis not present

## 2016-03-14 DIAGNOSIS — M70871 Other soft tissue disorders related to use, overuse and pressure, right ankle and foot: Secondary | ICD-10-CM | POA: Diagnosis not present

## 2016-03-14 DIAGNOSIS — M25871 Other specified joint disorders, right ankle and foot: Secondary | ICD-10-CM | POA: Diagnosis not present

## 2016-03-14 DIAGNOSIS — M70872 Other soft tissue disorders related to use, overuse and pressure, left ankle and foot: Secondary | ICD-10-CM | POA: Diagnosis not present

## 2016-03-14 DIAGNOSIS — M79671 Pain in right foot: Secondary | ICD-10-CM | POA: Diagnosis not present

## 2016-03-14 DIAGNOSIS — R2689 Other abnormalities of gait and mobility: Secondary | ICD-10-CM | POA: Diagnosis not present

## 2016-03-14 DIAGNOSIS — M25872 Other specified joint disorders, left ankle and foot: Secondary | ICD-10-CM | POA: Diagnosis not present

## 2016-03-19 DIAGNOSIS — M25871 Other specified joint disorders, right ankle and foot: Secondary | ICD-10-CM | POA: Diagnosis not present

## 2016-03-19 DIAGNOSIS — M70871 Other soft tissue disorders related to use, overuse and pressure, right ankle and foot: Secondary | ICD-10-CM | POA: Diagnosis not present

## 2016-03-19 DIAGNOSIS — M70872 Other soft tissue disorders related to use, overuse and pressure, left ankle and foot: Secondary | ICD-10-CM | POA: Diagnosis not present

## 2016-03-19 DIAGNOSIS — M79671 Pain in right foot: Secondary | ICD-10-CM | POA: Diagnosis not present

## 2016-03-19 DIAGNOSIS — R2689 Other abnormalities of gait and mobility: Secondary | ICD-10-CM | POA: Diagnosis not present

## 2016-03-19 DIAGNOSIS — M25872 Other specified joint disorders, left ankle and foot: Secondary | ICD-10-CM | POA: Diagnosis not present

## 2016-03-22 DIAGNOSIS — M70872 Other soft tissue disorders related to use, overuse and pressure, left ankle and foot: Secondary | ICD-10-CM | POA: Diagnosis not present

## 2016-03-22 DIAGNOSIS — M70871 Other soft tissue disorders related to use, overuse and pressure, right ankle and foot: Secondary | ICD-10-CM | POA: Diagnosis not present

## 2016-03-22 DIAGNOSIS — M25871 Other specified joint disorders, right ankle and foot: Secondary | ICD-10-CM | POA: Diagnosis not present

## 2016-03-22 DIAGNOSIS — M25872 Other specified joint disorders, left ankle and foot: Secondary | ICD-10-CM | POA: Diagnosis not present

## 2016-03-22 DIAGNOSIS — R2689 Other abnormalities of gait and mobility: Secondary | ICD-10-CM | POA: Diagnosis not present

## 2016-03-22 DIAGNOSIS — M79671 Pain in right foot: Secondary | ICD-10-CM | POA: Diagnosis not present

## 2016-03-26 DIAGNOSIS — M79671 Pain in right foot: Secondary | ICD-10-CM | POA: Diagnosis not present

## 2016-03-26 DIAGNOSIS — R2689 Other abnormalities of gait and mobility: Secondary | ICD-10-CM | POA: Diagnosis not present

## 2016-03-26 DIAGNOSIS — M25872 Other specified joint disorders, left ankle and foot: Secondary | ICD-10-CM | POA: Diagnosis not present

## 2016-03-26 DIAGNOSIS — M25871 Other specified joint disorders, right ankle and foot: Secondary | ICD-10-CM | POA: Diagnosis not present

## 2016-03-26 DIAGNOSIS — M70871 Other soft tissue disorders related to use, overuse and pressure, right ankle and foot: Secondary | ICD-10-CM | POA: Diagnosis not present

## 2016-03-26 DIAGNOSIS — M70872 Other soft tissue disorders related to use, overuse and pressure, left ankle and foot: Secondary | ICD-10-CM | POA: Diagnosis not present

## 2016-03-30 DIAGNOSIS — M25872 Other specified joint disorders, left ankle and foot: Secondary | ICD-10-CM | POA: Diagnosis not present

## 2016-03-30 DIAGNOSIS — M70871 Other soft tissue disorders related to use, overuse and pressure, right ankle and foot: Secondary | ICD-10-CM | POA: Diagnosis not present

## 2016-03-30 DIAGNOSIS — M25871 Other specified joint disorders, right ankle and foot: Secondary | ICD-10-CM | POA: Diagnosis not present

## 2016-03-30 DIAGNOSIS — M79671 Pain in right foot: Secondary | ICD-10-CM | POA: Diagnosis not present

## 2016-03-30 DIAGNOSIS — R2689 Other abnormalities of gait and mobility: Secondary | ICD-10-CM | POA: Diagnosis not present

## 2016-03-30 DIAGNOSIS — M70872 Other soft tissue disorders related to use, overuse and pressure, left ankle and foot: Secondary | ICD-10-CM | POA: Diagnosis not present

## 2016-04-03 DIAGNOSIS — R2689 Other abnormalities of gait and mobility: Secondary | ICD-10-CM | POA: Diagnosis not present

## 2016-04-03 DIAGNOSIS — M25871 Other specified joint disorders, right ankle and foot: Secondary | ICD-10-CM | POA: Diagnosis not present

## 2016-04-03 DIAGNOSIS — M70871 Other soft tissue disorders related to use, overuse and pressure, right ankle and foot: Secondary | ICD-10-CM | POA: Diagnosis not present

## 2016-04-03 DIAGNOSIS — M79672 Pain in left foot: Secondary | ICD-10-CM | POA: Diagnosis not present

## 2016-04-03 DIAGNOSIS — M79671 Pain in right foot: Secondary | ICD-10-CM | POA: Diagnosis not present

## 2016-04-03 DIAGNOSIS — M70872 Other soft tissue disorders related to use, overuse and pressure, left ankle and foot: Secondary | ICD-10-CM | POA: Diagnosis not present

## 2016-04-03 DIAGNOSIS — M25872 Other specified joint disorders, left ankle and foot: Secondary | ICD-10-CM | POA: Diagnosis not present

## 2016-04-05 DIAGNOSIS — M79671 Pain in right foot: Secondary | ICD-10-CM | POA: Diagnosis not present

## 2016-04-05 DIAGNOSIS — M25871 Other specified joint disorders, right ankle and foot: Secondary | ICD-10-CM | POA: Diagnosis not present

## 2016-04-05 DIAGNOSIS — M70872 Other soft tissue disorders related to use, overuse and pressure, left ankle and foot: Secondary | ICD-10-CM | POA: Diagnosis not present

## 2016-04-05 DIAGNOSIS — M25872 Other specified joint disorders, left ankle and foot: Secondary | ICD-10-CM | POA: Diagnosis not present

## 2016-04-05 DIAGNOSIS — R2689 Other abnormalities of gait and mobility: Secondary | ICD-10-CM | POA: Diagnosis not present

## 2016-04-05 DIAGNOSIS — M70871 Other soft tissue disorders related to use, overuse and pressure, right ankle and foot: Secondary | ICD-10-CM | POA: Diagnosis not present

## 2016-04-10 DIAGNOSIS — R2689 Other abnormalities of gait and mobility: Secondary | ICD-10-CM | POA: Diagnosis not present

## 2016-04-10 DIAGNOSIS — M25871 Other specified joint disorders, right ankle and foot: Secondary | ICD-10-CM | POA: Diagnosis not present

## 2016-04-10 DIAGNOSIS — M79671 Pain in right foot: Secondary | ICD-10-CM | POA: Diagnosis not present

## 2016-04-10 DIAGNOSIS — M70872 Other soft tissue disorders related to use, overuse and pressure, left ankle and foot: Secondary | ICD-10-CM | POA: Diagnosis not present

## 2016-04-10 DIAGNOSIS — M70871 Other soft tissue disorders related to use, overuse and pressure, right ankle and foot: Secondary | ICD-10-CM | POA: Diagnosis not present

## 2016-04-10 DIAGNOSIS — M25872 Other specified joint disorders, left ankle and foot: Secondary | ICD-10-CM | POA: Diagnosis not present

## 2016-04-12 DIAGNOSIS — M70872 Other soft tissue disorders related to use, overuse and pressure, left ankle and foot: Secondary | ICD-10-CM | POA: Diagnosis not present

## 2016-04-12 DIAGNOSIS — M70871 Other soft tissue disorders related to use, overuse and pressure, right ankle and foot: Secondary | ICD-10-CM | POA: Diagnosis not present

## 2016-04-12 DIAGNOSIS — M25871 Other specified joint disorders, right ankle and foot: Secondary | ICD-10-CM | POA: Diagnosis not present

## 2016-04-12 DIAGNOSIS — M25872 Other specified joint disorders, left ankle and foot: Secondary | ICD-10-CM | POA: Diagnosis not present

## 2016-04-12 DIAGNOSIS — M79671 Pain in right foot: Secondary | ICD-10-CM | POA: Diagnosis not present

## 2016-04-12 DIAGNOSIS — R2689 Other abnormalities of gait and mobility: Secondary | ICD-10-CM | POA: Diagnosis not present

## 2016-04-17 DIAGNOSIS — M79671 Pain in right foot: Secondary | ICD-10-CM | POA: Diagnosis not present

## 2016-04-17 DIAGNOSIS — R2689 Other abnormalities of gait and mobility: Secondary | ICD-10-CM | POA: Diagnosis not present

## 2016-04-17 DIAGNOSIS — M25872 Other specified joint disorders, left ankle and foot: Secondary | ICD-10-CM | POA: Diagnosis not present

## 2016-04-17 DIAGNOSIS — M25871 Other specified joint disorders, right ankle and foot: Secondary | ICD-10-CM | POA: Diagnosis not present

## 2016-04-17 DIAGNOSIS — M70871 Other soft tissue disorders related to use, overuse and pressure, right ankle and foot: Secondary | ICD-10-CM | POA: Diagnosis not present

## 2016-04-17 DIAGNOSIS — M70872 Other soft tissue disorders related to use, overuse and pressure, left ankle and foot: Secondary | ICD-10-CM | POA: Diagnosis not present

## 2016-04-24 DIAGNOSIS — M70871 Other soft tissue disorders related to use, overuse and pressure, right ankle and foot: Secondary | ICD-10-CM | POA: Diagnosis not present

## 2016-04-24 DIAGNOSIS — M25872 Other specified joint disorders, left ankle and foot: Secondary | ICD-10-CM | POA: Diagnosis not present

## 2016-04-24 DIAGNOSIS — R2689 Other abnormalities of gait and mobility: Secondary | ICD-10-CM | POA: Diagnosis not present

## 2016-04-24 DIAGNOSIS — M79671 Pain in right foot: Secondary | ICD-10-CM | POA: Diagnosis not present

## 2016-04-24 DIAGNOSIS — M70872 Other soft tissue disorders related to use, overuse and pressure, left ankle and foot: Secondary | ICD-10-CM | POA: Diagnosis not present

## 2016-04-24 DIAGNOSIS — M25871 Other specified joint disorders, right ankle and foot: Secondary | ICD-10-CM | POA: Diagnosis not present

## 2016-05-01 DIAGNOSIS — M79672 Pain in left foot: Secondary | ICD-10-CM | POA: Diagnosis not present

## 2016-05-01 DIAGNOSIS — M25871 Other specified joint disorders, right ankle and foot: Secondary | ICD-10-CM | POA: Diagnosis not present

## 2016-05-01 DIAGNOSIS — R2689 Other abnormalities of gait and mobility: Secondary | ICD-10-CM | POA: Diagnosis not present

## 2016-05-01 DIAGNOSIS — B351 Tinea unguium: Secondary | ICD-10-CM | POA: Diagnosis not present

## 2016-05-01 DIAGNOSIS — M70872 Other soft tissue disorders related to use, overuse and pressure, left ankle and foot: Secondary | ICD-10-CM | POA: Diagnosis not present

## 2016-05-01 DIAGNOSIS — L89892 Pressure ulcer of other site, stage 2: Secondary | ICD-10-CM | POA: Diagnosis not present

## 2016-05-01 DIAGNOSIS — M25872 Other specified joint disorders, left ankle and foot: Secondary | ICD-10-CM | POA: Diagnosis not present

## 2016-05-01 DIAGNOSIS — M70871 Other soft tissue disorders related to use, overuse and pressure, right ankle and foot: Secondary | ICD-10-CM | POA: Diagnosis not present

## 2016-05-01 DIAGNOSIS — M79671 Pain in right foot: Secondary | ICD-10-CM | POA: Diagnosis not present

## 2016-05-03 DIAGNOSIS — M79671 Pain in right foot: Secondary | ICD-10-CM | POA: Diagnosis not present

## 2016-05-03 DIAGNOSIS — M25871 Other specified joint disorders, right ankle and foot: Secondary | ICD-10-CM | POA: Diagnosis not present

## 2016-05-03 DIAGNOSIS — M70872 Other soft tissue disorders related to use, overuse and pressure, left ankle and foot: Secondary | ICD-10-CM | POA: Diagnosis not present

## 2016-05-03 DIAGNOSIS — M25872 Other specified joint disorders, left ankle and foot: Secondary | ICD-10-CM | POA: Diagnosis not present

## 2016-05-03 DIAGNOSIS — R2689 Other abnormalities of gait and mobility: Secondary | ICD-10-CM | POA: Diagnosis not present

## 2016-05-03 DIAGNOSIS — M70871 Other soft tissue disorders related to use, overuse and pressure, right ankle and foot: Secondary | ICD-10-CM | POA: Diagnosis not present

## 2016-05-07 DIAGNOSIS — M70871 Other soft tissue disorders related to use, overuse and pressure, right ankle and foot: Secondary | ICD-10-CM | POA: Diagnosis not present

## 2016-05-07 DIAGNOSIS — R2689 Other abnormalities of gait and mobility: Secondary | ICD-10-CM | POA: Diagnosis not present

## 2016-05-07 DIAGNOSIS — M25872 Other specified joint disorders, left ankle and foot: Secondary | ICD-10-CM | POA: Diagnosis not present

## 2016-05-07 DIAGNOSIS — M79672 Pain in left foot: Secondary | ICD-10-CM | POA: Diagnosis not present

## 2016-05-07 DIAGNOSIS — M70872 Other soft tissue disorders related to use, overuse and pressure, left ankle and foot: Secondary | ICD-10-CM | POA: Diagnosis not present

## 2016-05-07 DIAGNOSIS — M79671 Pain in right foot: Secondary | ICD-10-CM | POA: Diagnosis not present

## 2016-05-07 DIAGNOSIS — M25871 Other specified joint disorders, right ankle and foot: Secondary | ICD-10-CM | POA: Diagnosis not present

## 2016-05-08 DIAGNOSIS — M70871 Other soft tissue disorders related to use, overuse and pressure, right ankle and foot: Secondary | ICD-10-CM | POA: Diagnosis not present

## 2016-05-08 DIAGNOSIS — M25871 Other specified joint disorders, right ankle and foot: Secondary | ICD-10-CM | POA: Diagnosis not present

## 2016-05-08 DIAGNOSIS — R2689 Other abnormalities of gait and mobility: Secondary | ICD-10-CM | POA: Diagnosis not present

## 2016-05-08 DIAGNOSIS — M79671 Pain in right foot: Secondary | ICD-10-CM | POA: Diagnosis not present

## 2016-05-08 DIAGNOSIS — M25872 Other specified joint disorders, left ankle and foot: Secondary | ICD-10-CM | POA: Diagnosis not present

## 2016-05-08 DIAGNOSIS — M70872 Other soft tissue disorders related to use, overuse and pressure, left ankle and foot: Secondary | ICD-10-CM | POA: Diagnosis not present

## 2016-05-15 DIAGNOSIS — M25872 Other specified joint disorders, left ankle and foot: Secondary | ICD-10-CM | POA: Diagnosis not present

## 2016-05-15 DIAGNOSIS — M25871 Other specified joint disorders, right ankle and foot: Secondary | ICD-10-CM | POA: Diagnosis not present

## 2016-05-15 DIAGNOSIS — R2689 Other abnormalities of gait and mobility: Secondary | ICD-10-CM | POA: Diagnosis not present

## 2016-05-15 DIAGNOSIS — M79671 Pain in right foot: Secondary | ICD-10-CM | POA: Diagnosis not present

## 2016-05-15 DIAGNOSIS — M70872 Other soft tissue disorders related to use, overuse and pressure, left ankle and foot: Secondary | ICD-10-CM | POA: Diagnosis not present

## 2016-05-15 DIAGNOSIS — M70871 Other soft tissue disorders related to use, overuse and pressure, right ankle and foot: Secondary | ICD-10-CM | POA: Diagnosis not present

## 2016-05-17 DIAGNOSIS — M70871 Other soft tissue disorders related to use, overuse and pressure, right ankle and foot: Secondary | ICD-10-CM | POA: Diagnosis not present

## 2016-05-17 DIAGNOSIS — R2689 Other abnormalities of gait and mobility: Secondary | ICD-10-CM | POA: Diagnosis not present

## 2016-05-17 DIAGNOSIS — M79671 Pain in right foot: Secondary | ICD-10-CM | POA: Diagnosis not present

## 2016-05-17 DIAGNOSIS — M25871 Other specified joint disorders, right ankle and foot: Secondary | ICD-10-CM | POA: Diagnosis not present

## 2016-05-17 DIAGNOSIS — M70872 Other soft tissue disorders related to use, overuse and pressure, left ankle and foot: Secondary | ICD-10-CM | POA: Diagnosis not present

## 2016-05-17 DIAGNOSIS — M25872 Other specified joint disorders, left ankle and foot: Secondary | ICD-10-CM | POA: Diagnosis not present

## 2016-05-22 DIAGNOSIS — M70871 Other soft tissue disorders related to use, overuse and pressure, right ankle and foot: Secondary | ICD-10-CM | POA: Diagnosis not present

## 2016-05-22 DIAGNOSIS — M25872 Other specified joint disorders, left ankle and foot: Secondary | ICD-10-CM | POA: Diagnosis not present

## 2016-05-22 DIAGNOSIS — R2689 Other abnormalities of gait and mobility: Secondary | ICD-10-CM | POA: Diagnosis not present

## 2016-05-22 DIAGNOSIS — M25871 Other specified joint disorders, right ankle and foot: Secondary | ICD-10-CM | POA: Diagnosis not present

## 2016-05-22 DIAGNOSIS — M70872 Other soft tissue disorders related to use, overuse and pressure, left ankle and foot: Secondary | ICD-10-CM | POA: Diagnosis not present

## 2016-05-22 DIAGNOSIS — M79671 Pain in right foot: Secondary | ICD-10-CM | POA: Diagnosis not present

## 2016-05-24 DIAGNOSIS — R2689 Other abnormalities of gait and mobility: Secondary | ICD-10-CM | POA: Diagnosis not present

## 2016-05-24 DIAGNOSIS — M70871 Other soft tissue disorders related to use, overuse and pressure, right ankle and foot: Secondary | ICD-10-CM | POA: Diagnosis not present

## 2016-05-24 DIAGNOSIS — M70872 Other soft tissue disorders related to use, overuse and pressure, left ankle and foot: Secondary | ICD-10-CM | POA: Diagnosis not present

## 2016-05-24 DIAGNOSIS — M25871 Other specified joint disorders, right ankle and foot: Secondary | ICD-10-CM | POA: Diagnosis not present

## 2016-05-24 DIAGNOSIS — M25872 Other specified joint disorders, left ankle and foot: Secondary | ICD-10-CM | POA: Diagnosis not present

## 2016-05-24 DIAGNOSIS — M79671 Pain in right foot: Secondary | ICD-10-CM | POA: Diagnosis not present

## 2016-05-28 DIAGNOSIS — M70872 Other soft tissue disorders related to use, overuse and pressure, left ankle and foot: Secondary | ICD-10-CM | POA: Diagnosis not present

## 2016-05-28 DIAGNOSIS — M25871 Other specified joint disorders, right ankle and foot: Secondary | ICD-10-CM | POA: Diagnosis not present

## 2016-05-28 DIAGNOSIS — M70871 Other soft tissue disorders related to use, overuse and pressure, right ankle and foot: Secondary | ICD-10-CM | POA: Diagnosis not present

## 2016-05-28 DIAGNOSIS — M79671 Pain in right foot: Secondary | ICD-10-CM | POA: Diagnosis not present

## 2016-05-28 DIAGNOSIS — R2689 Other abnormalities of gait and mobility: Secondary | ICD-10-CM | POA: Diagnosis not present

## 2016-05-28 DIAGNOSIS — M25872 Other specified joint disorders, left ankle and foot: Secondary | ICD-10-CM | POA: Diagnosis not present

## 2016-05-29 ENCOUNTER — Encounter: Payer: Self-pay | Admitting: Internal Medicine

## 2016-05-29 ENCOUNTER — Non-Acute Institutional Stay (SKILLED_NURSING_FACILITY): Payer: Medicare Other | Admitting: Internal Medicine

## 2016-05-29 DIAGNOSIS — E119 Type 2 diabetes mellitus without complications: Secondary | ICD-10-CM

## 2016-05-29 DIAGNOSIS — E039 Hypothyroidism, unspecified: Secondary | ICD-10-CM | POA: Diagnosis not present

## 2016-05-29 DIAGNOSIS — E538 Deficiency of other specified B group vitamins: Secondary | ICD-10-CM | POA: Diagnosis not present

## 2016-05-29 DIAGNOSIS — K5909 Other constipation: Secondary | ICD-10-CM

## 2016-05-29 DIAGNOSIS — R296 Repeated falls: Secondary | ICD-10-CM

## 2016-05-29 DIAGNOSIS — F0150 Vascular dementia without behavioral disturbance: Secondary | ICD-10-CM

## 2016-05-29 NOTE — Progress Notes (Signed)
Patient ID: Lorraine [name], female   DOB: [birth year], 82 y.o.   MRN: [id number]  Location:  [facility] Room Number: [id number] Place of Service:  SNF (31) Provider:  Sirus Labrie L. Mariea Clonts, D.O., C.M.D.  Hollace Kinnier, DO  Patient Care Team: Gayland Curry, DO as PCP - General (Geriatric Medicine) Neldon Mc, MD as Consulting Physician (General Surgery) Particia Nearing, MD as Consulting Physician (Dermatology) Monna Fam, MD as Consulting Physician (Ophthalmology) Garlan Fair, MD as Consulting Physician (Gastroenterology) Kary Kos, MD as Consulting Physician (Neurosurgery) Elsie Saas, MD as Consulting Physician (Orthopedic Surgery) Selinda Orion, MD (Inactive) as Consulting Physician (Obstetrics and Gynecology) [facility] Rolm Bookbinder, MD as Consulting Physician (Dermatology)  Extended Emergency Contact Information Primary Emergency Contact: [place] of [place] Phone: [phone] Mobile Phone: [phone] Relation: Daughter Secondary Emergency Contact: Hubert Azure, [place] [address] Johnnette Litter of [place] Phone: [phone] Work Phone: [phone] Mobile Phone: [phone] Relation: Other  Code Status:  DNR Goals of care: Advanced Directive information Advanced Directives 05/29/2016  Does patient have an advance directive? Yes  Type of Advance Directive Out of facility DNR (pink MOST or yellow form);Healthcare Power of Attorney  Copy of advanced directive(s) in chart? Yes  Pre-existing out of facility DNR order (yellow form or pink MOST form) Yellow form placed in chart (order not valid for inpatient use);Pink MOST form placed in chart (order not valid for inpatient use)     Chief Complaint  Patient presents with  . Medical Management of Chronic Issues    Routine visit    HPI:  Pt is a 82 y.o. female seen today for medical management of chronic diseases.     Review of recent events:  Had a fall 9/8, sustained an abrasion to her right back.  She ambulates with her walker.  9/22, had not had a bm in 3 days so dulcolax suppository was given and then she had several large bms.    She is apparently to see Dr. Oneida Alar for callouses on both feet that need to be treated b/c they are quite painful for her when walking.  She sees him in October.    She has a hx of progressive memory loss and resides in the memory care unit at [facility]. She has caretakers during the day to help her with ADL's.    Dementia, without behavioral disturbance Maintained on Memantine bid, Risa-Bid, and Tumeric (per family request). Able to provide appropriate answers to questions and maintain conversation with minimal repetition. No reports of inappropriate behaviors from staff. Recent fall as above.  Still ambulates with walker.  Hypothyroidism, unspecified hypothyroidism type Denies complaints of heat/cold intolerance,  agitation, fatigue. Maintained on Synthroid 62.5 mcg daily. Lab Results  Component Value Date   TSH 0.40 (A) 01/06/2016  needs f/u  Gastroesophageal reflux disease without esophagitis Currently maintained on Protonix daily. Denies reports of relfux or indigestion. Weight stable.  Other constipation Denies complaints of constipation. Reports daily to every -other-day bowel movements without straining or blood in stool. On probiotic and miralax.  Diabetes Mellitus No reports in change in polydipsia, polyphagia, polyuria. Weight stable.  Not on medications for this.   Lab Results  Component Value Date   HGBA1C 5.1 07/08/2012    Past Medical History:  Diagnosis Date  . Abnormality of gait 2008   re: metoclopramide.   . Alopecia areata   . Anal fissure   .  Arthritis   . B12 deficiency   . Cataracts, bilateral   . Closed fracture of lateral malleolus   . Depression   . Diabetes mellitus without complication ([place])   . Diffuse cystic  mastopathy 1997   s/p right breast bx, benign  . Digestive-genital tract fistula, female   . Diverticulosis of colon (without mention of hemorrhage)   . Dizziness and giddiness    benign positional vertigo  . Dysphagia, pharyngoesophageal phase   . GERD (gastroesophageal reflux disease)   . Hyperlipidemia   . Hypertension   . Insomnia, unspecified   . Lumbago 2001   lumbar radiculopathy L3, spondylosis L2-3, 3-4. dx Dr.Cram  . Mild cognitive impairment with memory loss 12/16/2013  . Other lichen, not elsewhere classified [id number]   Lichen sclerosis perineum/perianal  . Other malaise and fatigue   . Other psoriasis   . Other specified diseases of blood and blood-forming organs(289.89)    s/p Chelation treatment  . Skin cancer   . Thyroid disease   . Unspecified constipation   . Ventral hernia, unspecified, without mention of obstruction or gangrene    Past Surgical History:  Procedure Laterality Date  . APPENDECTOMY/OVARIAN CYSTECTOMY  1950s  . BREAST BIOPSY  1997   right benign  . BREAST LUMPECTOMY  1997  . BUNIONECTOMY Bilateral 1975   twice  . CATARACT EXTRACTION W/ INTRAOCULAR LENS  IMPLANT, BILATERAL  1990  . COLECTOMY  11/2007   Sigmoid w/closure of fistula and repair of umbilical hernia  . CYSTECTOMY  1950's   brachial cleft  . HERNIA REPAIR    . TONSILLECTOMY AND ADENOIDECTOMY  1942  . VAGINAL HYSTERECTOMY  1998    Allergies  Allergen Reactions  . Codeine     Unknown   . Erythromycin     Unknown       Medication List       Accurate as of 05/29/16 11:59 PM. Always use your most recent med list.          acetaminophen 325 MG tablet Commonly known as:  TYLENOL Take 650 mg by mouth. Take two tablets twice daily as needed to reduce back pain   Calcium Carbonate-Vitamin D 600-400 MG-UNIT chew tablet Chew 1 tablet by mouth 2 (two) times daily.   cyanocobalamin 1000 MCG tablet Take 100 mcg by mouth daily.   ENSURE PO Take by mouth. Drink one can M-W-F    Fish Oil 1000 MG Cpdr Take 1,000 mg by mouth daily.   hydrocortisone cream 1 % Apply 1 application topically 2 (two) times daily. To chest as needed   ipratropium-albuterol 0.5-2.5 (3) MG/3ML Soln Commonly known as:  DUONEB Take 3 mLs by nebulization every 6 (six) hours as needed.   levothyroxine 75 MCG tablet Commonly known as:  SYNTHROID, LEVOTHROID Take 62.5 mcg by mouth daily before breakfast.   memantine 10 MG tablet Commonly known as:  NAMENDA Take 10 mg by mouth 2 (two) times daily.   multivitamin-iron-minerals-folic acid chewable tablet Chew 1 tablet by mouth daily.   polyethylene glycol powder powder Commonly known as:  GLYCOLAX/MIRALAX Take 17 g by mouth daily.   RISA-BID PROBIOTIC Tabs Take 1 tablet by mouth daily.   Turmeric 500 MG Tabs Take 1 tablet by mouth daily.   Vitamin D3 2000 units Tabs Take 1 tablet by mouth daily.       Review of Systems  Constitutional: Negative for activity change, appetite change, chills, fever and unexpected weight change.  HENT: Positive for hearing  loss. Negative for congestion.   Eyes: Negative for visual disturbance.  Respiratory: Negative for chest tightness and shortness of breath.   Cardiovascular: Negative for chest pain, palpitations and leg swelling.  Gastrointestinal: Positive for constipation. Negative for abdominal pain.  Genitourinary: Negative for dysuria.  Musculoskeletal: Positive for gait problem. Negative for arthralgias and back pain.       Callouses on feet  Neurological: Negative for light-headedness.  Hematological: Bruises/bleeds easily.  Psychiatric/Behavioral: Positive for confusion. Negative for dysphoric mood and sleep disturbance. The patient is not nervous/anxious.     Immunization History  Administered Date(s) Administered  . Influenza Whole 06/03/2012, 06/17/2013  . Influenza-Unspecified 06/09/2014, 06/16/2015  . PPD Test 09/29/2012  . Pneumococcal Polysaccharide-23 09/03/1998  . Td  09/03/1998  . Zoster 09/04/2007   Pertinent  Health Maintenance Due  Topic Date Due  . FOOT EXAM  06/30/1937  . OPHTHALMOLOGY EXAM  06/30/1939  . URINE MICROALBUMIN  06/30/1939  . PNA vac Low Risk Adult (2 of 2 - PCV13) 09/04/1999  . HEMOGLOBIN A1C  01/05/2013  . INFLUENZA VACCINE  04/03/2016  . DEXA SCAN  Completed   Fall Risk  01/19/2015 12/16/2013  Falls in the past year? Yes No  Number falls in past yr: 1 -  Injury with Fall? No -   Functional Status Survey: Is the patient deaf or have difficulty hearing?: Yes Does the patient have difficulty seeing, even when wearing glasses/contacts?: Yes Does the patient have difficulty concentrating, remembering, or making decisions?: Yes Does the patient have difficulty walking or climbing stairs?: Yes Does the patient have difficulty dressing or bathing?: Yes Does the patient have difficulty doing errands alone such as visiting a doctor's office or shopping?: Yes  Vitals:   05/29/16 1134  BP: 139/66  Pulse: 79  Resp: 18  Temp: 97.2 F (36.2 C)  TempSrc: Oral  SpO2: 94%   There is no height or weight on file to calculate BMI. Physical Exam  Constitutional: She appears well-developed. No distress.  Eyes:  glasses  Cardiovascular: Normal rate, regular rhythm, normal heart sounds and intact distal pulses.   Pulmonary/Chest: Effort normal and breath sounds normal. No respiratory distress.  Abdominal: Soft. Bowel sounds are normal. She exhibits no distension and no mass. There is no tenderness. There is no rebound and no guarding.  Musculoskeletal: Normal range of motion. She exhibits no tenderness or deformity.  Neurological: She is alert.  Skin: Skin is warm and dry.    Labs reviewed:  Recent Labs  08/05/15 11/11/15 01/06/16  NA 137 132* 140  K 4.2 3.7 3.8  BUN 26* 26*  --   CREATININE 1.2* 1.5* 1.1    Recent Labs  08/05/15  AST 24  ALT 17  ALKPHOS 38    Recent Labs  08/05/15 11/11/15 01/28/16  WBC 4.3 5.0 4.0   HGB 12.9 11.3* 11.7*  HCT 38 34* 38  PLT 138* 128* 135*   Lab Results  Component Value Date   TSH 0.40 (A) 01/06/2016   Lab Results  Component Value Date   HGBA1C 5.1 07/08/2012   Lab Results  Component Value Date   CHOL 175 09/16/2014   HDL 43 09/16/2014   LDLCALC 82 09/16/2014   TRIG 249 (A) 09/16/2014   CHOLHDL 3.5 07/09/2012   No results found for: VITAMINB12  Assessment/Plan 1. Vascular dementia without behavioral disturbance -is progressing gradually and probably mixed with AD -continues on namenda, tumeric and probiotic per family request -has caregiver to assist wit ADLs  2. Hypothyroidism, unspecified type -cont current synthroid, but appears she needs a f/u tsh  3. Type 2 diabetes mellitus without complication, without long-term current use of insulin (HCC) -no hba1c since 2013?  Will recheck--last one was no longer in diabetic range and she's not on any meds for glucose, had a period around then of losing a lot of weight  4. Other constipation -cont miralax and probiotic  5. Falls frequently -should have bone density study as still ambulates with walker and high risk for fx, cont ca with D and additional D3  6. B12 deficiency -cont B12 supplement, but no recent level done so need to see if she's absorbing it   Family/ staff Communication: discussed with memory care nursing  Labs/tests ordered:  Needs tsh (abn and no repeat?), hba1c (last 2013???), b12

## 2016-06-04 DIAGNOSIS — M70872 Other soft tissue disorders related to use, overuse and pressure, left ankle and foot: Secondary | ICD-10-CM | POA: Diagnosis not present

## 2016-06-04 DIAGNOSIS — M25871 Other specified joint disorders, right ankle and foot: Secondary | ICD-10-CM | POA: Diagnosis not present

## 2016-06-04 DIAGNOSIS — M79672 Pain in left foot: Secondary | ICD-10-CM | POA: Diagnosis not present

## 2016-06-04 DIAGNOSIS — M79671 Pain in right foot: Secondary | ICD-10-CM | POA: Diagnosis not present

## 2016-06-04 DIAGNOSIS — R399 Unspecified symptoms and signs involving the genitourinary system: Secondary | ICD-10-CM | POA: Diagnosis not present

## 2016-06-04 DIAGNOSIS — R319 Hematuria, unspecified: Secondary | ICD-10-CM | POA: Diagnosis not present

## 2016-06-04 DIAGNOSIS — M25872 Other specified joint disorders, left ankle and foot: Secondary | ICD-10-CM | POA: Diagnosis not present

## 2016-06-04 DIAGNOSIS — M70871 Other soft tissue disorders related to use, overuse and pressure, right ankle and foot: Secondary | ICD-10-CM | POA: Diagnosis not present

## 2016-06-04 DIAGNOSIS — R2689 Other abnormalities of gait and mobility: Secondary | ICD-10-CM | POA: Diagnosis not present

## 2016-06-13 ENCOUNTER — Other Ambulatory Visit: Payer: Self-pay | Admitting: Internal Medicine

## 2016-06-13 DIAGNOSIS — Z23 Encounter for immunization: Secondary | ICD-10-CM | POA: Diagnosis not present

## 2016-06-14 ENCOUNTER — Other Ambulatory Visit: Payer: Self-pay | Admitting: Internal Medicine

## 2016-06-14 DIAGNOSIS — D513 Other dietary vitamin B12 deficiency anemia: Secondary | ICD-10-CM | POA: Diagnosis not present

## 2016-06-14 DIAGNOSIS — M8580 Other specified disorders of bone density and structure, unspecified site: Secondary | ICD-10-CM

## 2016-06-14 DIAGNOSIS — D72819 Decreased white blood cell count, unspecified: Secondary | ICD-10-CM | POA: Diagnosis not present

## 2016-06-14 DIAGNOSIS — E039 Hypothyroidism, unspecified: Secondary | ICD-10-CM | POA: Diagnosis not present

## 2016-06-14 DIAGNOSIS — E119 Type 2 diabetes mellitus without complications: Secondary | ICD-10-CM | POA: Diagnosis not present

## 2016-06-14 DIAGNOSIS — D649 Anemia, unspecified: Secondary | ICD-10-CM | POA: Diagnosis not present

## 2016-06-14 LAB — VITAMIN B12: Vitamin B-12: 701

## 2016-06-14 LAB — HEMOGLOBIN A1C: HEMOGLOBIN A1C: 4

## 2016-06-14 LAB — TSH: TSH: 0.7 u[IU]/mL (ref <=5.90)

## 2016-06-19 ENCOUNTER — Ambulatory Visit (INDEPENDENT_AMBULATORY_CARE_PROVIDER_SITE_OTHER): Payer: Medicare Other | Admitting: Sports Medicine

## 2016-06-19 ENCOUNTER — Encounter: Payer: Self-pay | Admitting: Sports Medicine

## 2016-06-19 DIAGNOSIS — M25579 Pain in unspecified ankle and joints of unspecified foot: Secondary | ICD-10-CM | POA: Diagnosis not present

## 2016-06-19 DIAGNOSIS — L84 Corns and callosities: Secondary | ICD-10-CM | POA: Insufficient documentation

## 2016-06-19 NOTE — Progress Notes (Signed)
CC: Bilat foot pain  Patient brought from assisted living at [facility] Pain with walking OT has been working with her On several past times she has had calluses trimmed by podiatry Patient finds this painful and wonders about different shoes or ways to lessen calluses  OT sends a referral note Question size and type of shoe to use Question insole adjustments Treatment for calluses  Past Hx Long list of chronic medical problems Includes DM Dementia  ROS Uses walker for ambulation Denies numbness in feet Feels unsteady without walker   PE Pleasant older F/ difficulty hearing and memory is limited BP (!) 97/48   Ht 5\' 7"  (D34-534 m)   Wt 148 lb (67.1 kg)   BMI 23.18 kg/m   Feet show bilateral loss of longitudinal arch Loss of transverse arch bilatearally Calluses MTP 1 plantar RT and LT Callus distal tip of RT 4th toe No skin breakdown today Gross sensory check patient seems to feel light touch  Unsteady with standing Pain under first and mid MT heads on standing without support insoles

## 2016-06-19 NOTE — Assessment & Plan Note (Signed)
try using MT pads to see if we can unload some of pressure on forefoot Walking in office this relieved pain  Better to avoid calluses by better insoles and padding as risk for continued shaving with DM is high Trial with MT pads for one month and adjust as needed

## 2016-06-20 ENCOUNTER — Other Ambulatory Visit: Payer: Self-pay | Admitting: Internal Medicine

## 2016-06-20 DIAGNOSIS — M70872 Other soft tissue disorders related to use, overuse and pressure, left ankle and foot: Secondary | ICD-10-CM | POA: Diagnosis not present

## 2016-06-20 DIAGNOSIS — M25872 Other specified joint disorders, left ankle and foot: Secondary | ICD-10-CM | POA: Diagnosis not present

## 2016-06-20 DIAGNOSIS — M25871 Other specified joint disorders, right ankle and foot: Secondary | ICD-10-CM | POA: Diagnosis not present

## 2016-06-20 DIAGNOSIS — M70871 Other soft tissue disorders related to use, overuse and pressure, right ankle and foot: Secondary | ICD-10-CM | POA: Diagnosis not present

## 2016-06-20 DIAGNOSIS — R2689 Other abnormalities of gait and mobility: Secondary | ICD-10-CM | POA: Diagnosis not present

## 2016-06-20 DIAGNOSIS — E2839 Other primary ovarian failure: Secondary | ICD-10-CM

## 2016-06-20 DIAGNOSIS — M79671 Pain in right foot: Secondary | ICD-10-CM | POA: Diagnosis not present

## 2016-06-21 DIAGNOSIS — Z23 Encounter for immunization: Secondary | ICD-10-CM | POA: Diagnosis not present

## 2016-06-22 ENCOUNTER — Ambulatory Visit
Admission: RE | Admit: 2016-06-22 | Discharge: 2016-06-22 | Disposition: A | Discharge status: Home / Self Care | Payer: Medicare Other | Source: Ambulatory Visit | Attending: Internal Medicine | Admitting: Internal Medicine

## 2016-06-22 DIAGNOSIS — Z780 Asymptomatic menopausal state: Secondary | ICD-10-CM | POA: Diagnosis not present

## 2016-06-22 DIAGNOSIS — E2839 Other primary ovarian failure: Secondary | ICD-10-CM

## 2016-06-22 DIAGNOSIS — M85832 Other specified disorders of bone density and structure, left forearm: Secondary | ICD-10-CM | POA: Diagnosis not present

## 2016-06-22 LAB — HM DEXA SCAN

## 2016-06-25 ENCOUNTER — Encounter: Payer: Self-pay | Admitting: *Deleted

## 2016-06-26 DIAGNOSIS — M79671 Pain in right foot: Secondary | ICD-10-CM | POA: Diagnosis not present

## 2016-06-26 DIAGNOSIS — M70871 Other soft tissue disorders related to use, overuse and pressure, right ankle and foot: Secondary | ICD-10-CM | POA: Diagnosis not present

## 2016-06-26 DIAGNOSIS — M70872 Other soft tissue disorders related to use, overuse and pressure, left ankle and foot: Secondary | ICD-10-CM | POA: Diagnosis not present

## 2016-06-26 DIAGNOSIS — M25871 Other specified joint disorders, right ankle and foot: Secondary | ICD-10-CM | POA: Diagnosis not present

## 2016-06-26 DIAGNOSIS — R2689 Other abnormalities of gait and mobility: Secondary | ICD-10-CM | POA: Diagnosis not present

## 2016-06-26 DIAGNOSIS — M25872 Other specified joint disorders, left ankle and foot: Secondary | ICD-10-CM | POA: Diagnosis not present

## 2016-06-29 DIAGNOSIS — M70872 Other soft tissue disorders related to use, overuse and pressure, left ankle and foot: Secondary | ICD-10-CM | POA: Diagnosis not present

## 2016-06-29 DIAGNOSIS — M25872 Other specified joint disorders, left ankle and foot: Secondary | ICD-10-CM | POA: Diagnosis not present

## 2016-06-29 DIAGNOSIS — M79671 Pain in right foot: Secondary | ICD-10-CM | POA: Diagnosis not present

## 2016-06-29 DIAGNOSIS — R2689 Other abnormalities of gait and mobility: Secondary | ICD-10-CM | POA: Diagnosis not present

## 2016-06-29 DIAGNOSIS — M70871 Other soft tissue disorders related to use, overuse and pressure, right ankle and foot: Secondary | ICD-10-CM | POA: Diagnosis not present

## 2016-06-29 DIAGNOSIS — M25871 Other specified joint disorders, right ankle and foot: Secondary | ICD-10-CM | POA: Diagnosis not present

## 2016-07-23 ENCOUNTER — Encounter: Payer: Self-pay | Admitting: Adult Health

## 2016-07-23 ENCOUNTER — Non-Acute Institutional Stay (SKILLED_NURSING_FACILITY): Payer: Medicare Other | Admitting: Adult Health

## 2016-07-23 DIAGNOSIS — E538 Deficiency of other specified B group vitamins: Secondary | ICD-10-CM | POA: Diagnosis not present

## 2016-07-23 DIAGNOSIS — K5909 Other constipation: Secondary | ICD-10-CM

## 2016-07-23 DIAGNOSIS — R635 Abnormal weight gain: Secondary | ICD-10-CM | POA: Diagnosis not present

## 2016-07-23 DIAGNOSIS — F0150 Vascular dementia without behavioral disturbance: Secondary | ICD-10-CM | POA: Diagnosis not present

## 2016-07-23 DIAGNOSIS — M8589 Other specified disorders of bone density and structure, multiple sites: Secondary | ICD-10-CM | POA: Diagnosis not present

## 2016-07-23 DIAGNOSIS — E039 Hypothyroidism, unspecified: Secondary | ICD-10-CM | POA: Diagnosis not present

## 2016-07-23 NOTE — Progress Notes (Signed)
Patient ID: Lorraine [name], female   DOB: March 24, 1927, 82 y.o.   MRN: [id number]  Location:   [facility]   Place of Service:  ALF (13) Provider:   Cindi Carbon, ANP [place] [phone]   REED, Jonelle Sidle, DO  Patient Care Team: Gayland Curry, DO as PCP - General (Geriatric Medicine) Neldon Mc, MD as Consulting Physician (General Surgery) Particia Nearing, MD as Consulting Physician (Dermatology) Monna Fam, MD as Consulting Physician (Ophthalmology) Garlan Fair, MD as Consulting Physician (Gastroenterology) Kary Kos, MD as Consulting Physician (Neurosurgery) Elsie Saas, MD as Consulting Physician (Orthopedic Surgery) Selinda Orion, MD (Inactive) as Consulting Physician (Obstetrics and Gynecology) [facility] Rolm Bookbinder, MD as Consulting Physician (Dermatology)  Extended Emergency Contact Information Primary Emergency Contact: [place] of [place] Phone: [phone] Mobile Phone: [phone] Relation: Daughter Secondary Emergency Contact: Hubert Azure, [place] [address] Johnnette Litter of [place] Phone: [phone] Work Phone: [phone] Mobile Phone: [phone] Relation: Other  Code Status:  DNR Goals of care: Advanced Directive information Advanced Directives 07/23/2016  Does patient have an advance directive? Yes  Type of Advance Directive -  Does patient want to make changes to advanced directive? Yes - information given  Copy of advanced directive(s) in chart? -  Pre-existing out of facility DNR order (yellow form or pink MOST form) Yellow form placed in chart (order not valid for inpatient use);Pink MOST form placed in chart (order not valid for inpatient use)     Chief Complaint  Patient presents with  . Medical Management of Chronic Issues    HPI:  Pt is a 82 y.o. female seen today for medical management of chronic diseases.  She has a hx of progressive memory  loss and resides in the memory care unit at [facility] She has caretakers during the day to help her with ADL's.   MMSE 19/30, current on namenda with a hx of vascular dementia Weight trending up by 10 lbs since July, small portions ordered.  TSH WNL, on synthroid Staff report constipation on miralax. Bone density T score -1.2 osteopenia noted on 06/22/16, currently on Calcium and Vit D supplements Caregiver reports she is doing well, continues to ambulate, has no complaints except constipation, and seem to be happy at [facility] She has a hx of DM but her last A1C was 5.1 in Nov of 2017. She was seen by Dr. Oneida Alar (06/19/16)with podiatry and ordered a foot support due to pain she is having due to calluses. She reports that it is not better and she will be returning to see him.    Past Medical History:  Diagnosis Date  . Abnormality of gait 2008   re: metoclopramide.   . Alopecia areata   . Anal fissure   . Arthritis   . B12 deficiency   . Cataracts, bilateral   . Closed fracture of lateral malleolus   . Depression   . Diabetes mellitus without complication ([place])   . Diffuse cystic mastopathy 1997   s/p right breast bx, benign  . Digestive-genital tract fistula, female   . Diverticulosis of colon (without mention of hemorrhage)   . Dizziness and giddiness    benign positional vertigo  . Dysphagia, pharyngoesophageal phase   . GERD (gastroesophageal reflux disease)   . Hyperlipidemia   . Hypertension   . Insomnia, unspecified   . Lumbago 2001   lumbar radiculopathy L3, spondylosis L2-3, 3-4. dx Dr.Cram  .  Mild cognitive impairment with memory loss 12/16/2013  . Other lichen, not elsewhere classified [id number]   Lichen sclerosis perineum/perianal  . Other malaise and fatigue   . Other psoriasis   . Other specified diseases of blood and blood-forming organs(289.89)    s/p Chelation treatment  . Skin cancer   . Thyroid disease   . Unspecified constipation   . Ventral hernia,  unspecified, without mention of obstruction or gangrene    Past Surgical History:  Procedure Laterality Date  . APPENDECTOMY/OVARIAN CYSTECTOMY  1950s  . BREAST BIOPSY  1997   right benign  . BREAST LUMPECTOMY  1997  . BUNIONECTOMY Bilateral 1975   twice  . CATARACT EXTRACTION W/ INTRAOCULAR LENS  IMPLANT, BILATERAL  1990  . COLECTOMY  11/2007   Sigmoid w/closure of fistula and repair of umbilical hernia  . CYSTECTOMY  1950's   brachial cleft  . HERNIA REPAIR    . TONSILLECTOMY AND ADENOIDECTOMY  1942  . VAGINAL HYSTERECTOMY  1998    Allergies  Allergen Reactions  . Codeine     Unknown   . Erythromycin     Unknown       Medication List       Accurate as of 07/23/16  3:43 PM. Always use your most recent med list.          acetaminophen 325 MG tablet Commonly known as:  TYLENOL Take 650 mg by mouth. Take two tablets twice daily as needed to reduce back pain   Calcium Carbonate-Vitamin D 600-400 MG-UNIT chew tablet Chew 1 tablet by mouth 2 (two) times daily.   conjugated estrogens vaginal cream Commonly known as:  PREMARIN Place 1 Applicatorful vaginally 3 (three) times a week.   cyanocobalamin 1000 MCG tablet Take 100 mcg by mouth daily.   Fish Oil 1000 MG Cpdr Take 1,000 mg by mouth daily.   hydrocortisone cream 1 % Apply 1 application topically 2 (two) times daily. To chest as needed   ipratropium-albuterol 0.5-2.5 (3) MG/3ML Soln Commonly known as:  DUONEB Take 3 mLs by nebulization every 6 (six) hours as needed.   levothyroxine 75 MCG tablet Commonly known as:  SYNTHROID, LEVOTHROID Take 62.5 mcg by mouth daily before breakfast.   LORazepam 0.5 MG tablet Commonly known as:  ATIVAN Take 0.5 mg by mouth every 8 (eight) hours as needed for anxiety.   memantine 10 MG tablet Commonly known as:  NAMENDA Take 10 mg by mouth 2 (two) times daily.   multivitamin-iron-minerals-folic acid chewable tablet Chew 1 tablet by mouth daily.   polyethylene  glycol powder powder Commonly known as:  GLYCOLAX/MIRALAX Take 17 g by mouth daily.   RISA-BID PROBIOTIC Tabs Take 1 tablet by mouth daily.   Turmeric 500 MG Tabs Take 1 tablet by mouth daily.   Vitamin D3 2000 units Tabs Take 1 tablet by mouth daily.       Review of Systems  Constitutional: Positive for unexpected weight change. Negative for activity change, appetite change, chills, fatigue and fever.  HENT: Negative.  Negative for congestion.   Respiratory: Negative for cough, shortness of breath and wheezing.   Cardiovascular: Negative for chest pain, palpitations and leg swelling.  Gastrointestinal: Positive for constipation. Negative for abdominal distention, abdominal pain, blood in stool and diarrhea.  Endocrine: Negative for cold intolerance, heat intolerance, polydipsia, polyphagia and polyuria.  Genitourinary: Negative for difficulty urinating, dysuria, frequency, urgency and vaginal pain.  Musculoskeletal: Positive for arthralgias and gait problem. Negative for back pain, joint swelling  and myalgias.  Skin: Negative for color change and pallor.  Neurological: Negative for dizziness, facial asymmetry and headaches.  Psychiatric/Behavioral: Positive for confusion. Negative for agitation and behavioral problems. The patient is not nervous/anxious.     Immunization History  Administered Date(s) Administered  . Influenza Inj Mdck Quad Pf 06/21/2016  . Influenza Whole 06/03/2012, 06/17/2013  . Influenza-Unspecified 06/09/2014, 06/16/2015  . PPD Test 09/29/2012  . Pneumococcal Polysaccharide-23 09/03/1998  . Td 09/03/1998  . Zoster 09/04/2007   Pertinent  Health Maintenance Due  Topic Date Due  . FOOT EXAM  06/30/1939  . OPHTHALMOLOGY EXAM  06/30/1939  . URINE MICROALBUMIN  06/30/1939  . PNA vac Low Risk Adult (2 of 2 - PCV13) 09/04/1999  . HEMOGLOBIN A1C  01/05/2013  . INFLUENZA VACCINE  Completed  . DEXA SCAN  Completed   Fall Risk  06/19/2016 01/19/2015  12/16/2013  Falls in the past year? Yes Yes No  Number falls in past yr: 1 1 -  Injury with Fall? No No -  Risk for fall due to : Other (Comment) - -  Follow up Education provided - -   Functional Status Survey:    Vitals:   07/23/16 1526  BP: 127/60  Pulse: 77  Weight: 152 lb 6.4 oz (69.1 kg)   Body mass index is 23.87 kg/m.  Wt Readings from Last 3 Encounters:  07/23/16 152 lb 6.4 oz (69.1 kg)  06/19/16 148 lb (67.1 kg)  03/08/16 142 lb (64.4 kg)    Physical Exam  Constitutional: She appears well-developed and well-nourished. No distress.  HENT:  Head: Normocephalic and atraumatic.  Right Ear: External ear normal.  Left Ear: External ear normal.  Nose: Nose normal.  Mouth/Throat: Oropharynx is clear and moist. No oropharyngeal exudate.  Has alopecia  Eyes: Conjunctivae are normal. Pupils are equal, round, and reactive to light. Right eye exhibits no discharge. Left eye exhibits no discharge.  Neck: Normal range of motion. Neck supple. No JVD present. No thyromegaly present.  Cardiovascular: Normal rate, regular rhythm, normal heart sounds and intact distal pulses.   No murmur heard. Pulmonary/Chest: Effort normal and breath sounds normal.  Abdominal: Soft. Bowel sounds are normal. She exhibits no distension.  Musculoskeletal: She exhibits no edema or tenderness.  Lymphadenopathy:    She has no cervical adenopathy.  Neurological: She is alert. No cranial nerve deficit.  Oriented to self, place, and situation, not time.  Skin: Skin is warm and dry. She is not diaphoretic.  Psychiatric: She has a normal mood and affect. Her behavior is normal.  Nursing note and vitals reviewed.   Labs reviewed:  Recent Labs  08/05/15 11/11/15 01/06/16  NA 137 132* 140  K 4.2 3.7 3.8  BUN 26* 26*  --   CREATININE 1.2* 1.5* 1.1    Recent Labs  08/05/15  AST 24  ALT 17  ALKPHOS 38    Recent Labs  08/05/15 11/11/15 01/28/16  WBC 4.3 5.0 4.0  HGB 12.9 11.3* 11.7*  HCT 38  34* 38  PLT 138* 128* 135*   Lab Results  Component Value Date   TSH 0.40 (A) 01/06/2016   Lab Results  Component Value Date   HGBA1C 5.1 07/08/2012   Lab Results  Component Value Date   CHOL 175 09/16/2014   HDL 43 09/16/2014   LDLCALC 82 09/16/2014   TRIG 249 (A) 09/16/2014   CHOLHDL 3.5 07/09/2012    Significant Diagnostic Results in last 30 days:  No results found.  B12 701 on 06/14/16  Assessment/Plan 1. Vascular dementia without behavioral disturbance Stable Continue namenda 10 mg BID  2. Hypothyroidism, unspecified type Stable Continue synthroid 75 mcg qd  3. Other constipation Colace 100 mg BID  Continue miralax 17 gm qd  4. Weight gain Small portions No signs of CHF Continue to monitor and check TSH earlier than scheduled if weight continues to trend upward  5. Osteopenia of multiple sites Continue calcium and Vit D supplmentation  6. B12 deficiency Continue 1000 mcg qd of B12  Family/ staff Communication: discussed with resident and caretaker  Labs/tests ordered:     Cindi Carbon, [place] [phone]

## 2016-07-30 DIAGNOSIS — M8580 Other specified disorders of bone density and structure, unspecified site: Secondary | ICD-10-CM | POA: Insufficient documentation

## 2016-07-30 DIAGNOSIS — R635 Abnormal weight gain: Secondary | ICD-10-CM | POA: Insufficient documentation

## 2016-09-24 ENCOUNTER — Non-Acute Institutional Stay: Payer: Medicare Other | Admitting: Adult Health

## 2016-09-24 ENCOUNTER — Encounter: Payer: Self-pay | Admitting: Adult Health

## 2016-09-24 DIAGNOSIS — K5909 Other constipation: Secondary | ICD-10-CM | POA: Diagnosis not present

## 2016-09-24 DIAGNOSIS — E119 Type 2 diabetes mellitus without complications: Secondary | ICD-10-CM | POA: Diagnosis not present

## 2016-09-24 DIAGNOSIS — Z23 Encounter for immunization: Secondary | ICD-10-CM | POA: Diagnosis not present

## 2016-09-24 DIAGNOSIS — B351 Tinea unguium: Secondary | ICD-10-CM

## 2016-09-24 DIAGNOSIS — K219 Gastro-esophageal reflux disease without esophagitis: Secondary | ICD-10-CM | POA: Diagnosis not present

## 2016-09-24 DIAGNOSIS — M8589 Other specified disorders of bone density and structure, multiple sites: Secondary | ICD-10-CM | POA: Diagnosis not present

## 2016-09-24 DIAGNOSIS — F0150 Vascular dementia without behavioral disturbance: Secondary | ICD-10-CM

## 2016-09-24 DIAGNOSIS — I714 Abdominal aortic aneurysm, without rupture, unspecified: Secondary | ICD-10-CM

## 2016-09-24 LAB — HM DIABETES FOOT EXAM

## 2016-09-24 NOTE — Progress Notes (Signed)
Patient ID: Lorraine [name], female   DOB: 03-27-1927, 83 y.o.   MRN: [id number]  Location:   [facility]   Place of Service:  ALF (13) Provider:   Cindi Carbon, ANP [place] [phone]   REED, Jonelle Sidle, DO  Patient Care Team: Gayland Curry, DO as PCP - General (Geriatric Medicine) Neldon Mc, MD as Consulting Physician (General Surgery) Particia Nearing, MD as Consulting Physician (Dermatology) Monna Fam, MD as Consulting Physician (Ophthalmology) Garlan Fair, MD as Consulting Physician (Gastroenterology) Kary Kos, MD as Consulting Physician (Neurosurgery) Elsie Saas, MD as Consulting Physician (Orthopedic Surgery) Selinda Orion, MD (Inactive) as Consulting Physician (Obstetrics and Gynecology) [facility] Rolm Bookbinder, MD as Consulting Physician (Dermatology)  Extended Emergency Contact Information Primary Emergency Contact: [place] of [place] Phone: [phone] Mobile Phone: [phone] Relation: Daughter Secondary Emergency Contact: Hubert Azure,  [address] Johnnette Litter of [place] Phone: [phone] Work Phone: [phone] Mobile Phone: [phone] Relation: Other  Code Status:  DNR Goals of care: Advanced Directive information Advanced Directives 09/24/2016  Does Patient Have a Medical Advance Directive? Yes  Type of Paramedic of [place];Living will;Out of facility DNR (pink MOST or yellow form)  Does patient want to make changes to medical advance directive? No - Patient declined  Copy of [place] in Chart? Yes  Pre-existing out of facility DNR order (yellow form or pink MOST form) Yellow form placed in chart (order not valid for inpatient use);Pink MOST form placed in chart (order not valid for inpatient use)     Chief Complaint  Patient presents with  . Acute Visit    toe nail fungus    HPI:  Pt is a 83 y.o.  female seen today for medical management of chronic diseases.  She has a hx of progressive memory loss and resides in the memory care unit at [facility]. She has caretakers during the day to help her with ADL's.   MMSE 14/30 01/12/16, current on namenda with a hx of vascular dementia Staff report constipation on miralax. Bone density T score -1.2 osteopenia noted on 06/22/16, currently on Calcium and Vit D supplements Staff report fungus on her toe nails present for 1-2 months. Has painful calluses on both feet that have not responded to shoe changes and inserts She has a hx of DM but her last A1C was 4 06/14/16 She was seen by Dr. Oneida Alar (06/19/16)with podiatry and ordered a foot support due to pain she is having due to calluses.    Hx of abd ultrasound on 09/14/15 4.16 x 4.63cm of the mid segment, 4.28 x 3.51 cm of the distal segment. BP controlled  Past Medical History:  Diagnosis Date  . Abnormality of gait 2008   re: metoclopramide.   . Alopecia areata   . Anal fissure   . Arthritis   . B12 deficiency   . Cataracts, bilateral   . Closed fracture of lateral malleolus   . Depression   . Diabetes mellitus without complication ([place])   . Diffuse cystic mastopathy 1997   s/p right breast bx, benign  . Digestive-genital tract fistula, female   . Diverticulosis of colon (without mention of hemorrhage)   . Dizziness and giddiness    benign positional vertigo  . Dysphagia, pharyngoesophageal phase   . GERD (gastroesophageal reflux disease)   . Hyperlipidemia   . Hypertension   . Insomnia, unspecified   .  Lumbago 2001   lumbar radiculopathy L3, spondylosis L2-3, 3-4. dx Dr.Cram  . Mild cognitive impairment with memory loss 12/16/2013  . Other lichen, not elsewhere classified [id number]   Lichen sclerosis perineum/perianal  . Other malaise and fatigue   . Other psoriasis   . Other specified diseases of blood and blood-forming organs(289.89)    s/p Chelation treatment  . Skin cancer   .  Thyroid disease   . Unspecified constipation   . Ventral hernia, unspecified, without mention of obstruction or gangrene    Past Surgical History:  Procedure Laterality Date  . APPENDECTOMY/OVARIAN CYSTECTOMY  1950s  . BREAST BIOPSY  1997   right benign  . BREAST LUMPECTOMY  1997  . BUNIONECTOMY Bilateral 1975   twice  . CATARACT EXTRACTION W/ INTRAOCULAR LENS  IMPLANT, BILATERAL  1990  . COLECTOMY  11/2007   Sigmoid w/closure of fistula and repair of umbilical hernia  . CYSTECTOMY  1950's   brachial cleft  . HERNIA REPAIR    . TONSILLECTOMY AND ADENOIDECTOMY  1942  . VAGINAL HYSTERECTOMY  1998    Allergies  Allergen Reactions  . Codeine     Unknown   . Erythromycin     Unknown     Allergies as of 09/24/2016      Reactions   Codeine    Unknown    Erythromycin    Unknown       Medication List       Accurate as of 09/24/16  2:34 PM. Always use your most recent med list.          acetaminophen 325 MG tablet Commonly known as:  TYLENOL Take 650 mg by mouth. Take two tablets twice daily as needed to reduce back pain   Calcium Carbonate-Vitamin D 600-400 MG-UNIT chew tablet Chew 1 tablet by mouth 2 (two) times daily.   conjugated estrogens vaginal cream Commonly known as:  PREMARIN Place 1 Applicatorful vaginally 3 (three) times a week.   cyanocobalamin 1000 MCG tablet Take 100 mcg by mouth daily.   docusate sodium 100 MG capsule Commonly known as:  COLACE Take 100 mg by mouth 2 (two) times daily.   Fish Oil 1000 MG Cpdr Take 1,000 mg by mouth daily.   hydrocortisone cream 1 % Apply 1 application topically 2 (two) times daily. To chest as needed   ipratropium-albuterol 0.5-2.5 (3) MG/3ML Soln Commonly known as:  DUONEB Take 3 mLs by nebulization every 6 (six) hours as needed.   levothyroxine 75 MCG tablet Commonly known as:  SYNTHROID, LEVOTHROID Take 62.5 mcg by mouth daily before breakfast.   memantine 10 MG tablet Commonly known as:   NAMENDA Take 10 mg by mouth 2 (two) times daily.   multivitamin-iron-minerals-folic acid chewable tablet Chew 1 tablet by mouth daily.   polyethylene glycol powder powder Commonly known as:  GLYCOLAX/MIRALAX Take 17 g by mouth daily.   RISA-BID PROBIOTIC Tabs Take 1 tablet by mouth daily.   Turmeric 500 MG Tabs Take 1 tablet by mouth daily.   Vitamin D3 2000 units Tabs Take 1 tablet by mouth daily.       Review of Systems  Constitutional: Negative for activity change, appetite change, chills, fatigue, fever and unexpected weight change.  HENT: Negative.  Negative for congestion.   Respiratory: Negative for cough, shortness of breath and wheezing.   Cardiovascular: Negative for chest pain, palpitations and leg swelling.  Gastrointestinal: Negative for abdominal distention, abdominal pain, blood in stool, constipation and diarrhea.  Endocrine:  Negative for cold intolerance, heat intolerance, polydipsia, polyphagia and polyuria.  Genitourinary: Negative for difficulty urinating, dysuria, frequency, urgency and vaginal pain.  Musculoskeletal: Positive for arthralgias and gait problem. Negative for back pain, joint swelling and myalgias.  Skin: Negative for color change and pallor.       Foot calluses, thick nails  Neurological: Negative for dizziness, facial asymmetry and headaches.  Psychiatric/Behavioral: Positive for confusion. Negative for agitation and behavioral problems. The patient is not nervous/anxious.     Immunization History  Administered Date(s) Administered  . Influenza Inj Mdck Quad Pf 06/21/2016  . Influenza Whole 06/03/2012, 06/17/2013  . Influenza-Unspecified 06/09/2014, 06/16/2015  . PPD Test 09/29/2012  . Pneumococcal Polysaccharide-23 09/03/1998  . Td 09/03/1998  . Zoster 09/04/2007   Pertinent  Health Maintenance Due  Topic Date Due  . FOOT EXAM  06/30/1939  . OPHTHALMOLOGY EXAM  06/30/1939  . URINE MICROALBUMIN  06/30/1939  . PNA vac Low Risk  Adult (2 of 2 - PCV13) 09/04/1999  . HEMOGLOBIN A1C  12/13/2016  . INFLUENZA VACCINE  Completed  . DEXA SCAN  Completed   Fall Risk  06/19/2016 01/19/2015 12/16/2013  Falls in the past year? Yes Yes No  Number falls in past yr: 1 1 -  Injury with Fall? No No -  Risk for fall due to : Other (Comment) - -  Follow up Education provided - -   Functional Status Survey:    Vitals:   09/24/16 1358  BP: (!) 98/54  Pulse: 75  Temp: (!) 96.2 F (35.7 C)  Weight: 153 lb 9.6 oz (69.7 kg)   Body mass index is 24.06 kg/m.  Wt Readings from Last 3 Encounters:  09/24/16 153 lb 9.6 oz (69.7 kg)  07/23/16 152 lb 6.4 oz (69.1 kg)  06/19/16 148 lb (67.1 kg)    Physical Exam  Constitutional: She appears well-developed and well-nourished. No distress.  HENT:  Head: Normocephalic and atraumatic.  Right Ear: External ear normal.  Left Ear: External ear normal.  Nose: Nose normal.  Mouth/Throat: Oropharynx is clear and moist. No oropharyngeal exudate.  Has alopecia  Eyes: Conjunctivae are normal. Pupils are equal, round, and reactive to light. Right eye exhibits no discharge. Left eye exhibits no discharge.  Neck: Normal range of motion. Neck supple. No JVD present. No thyromegaly present.  Cardiovascular: Normal rate, regular rhythm, normal heart sounds and intact distal pulses.   No murmur heard. Pulmonary/Chest: Effort normal and breath sounds normal.  Abdominal: Soft. Bowel sounds are normal. She exhibits no distension.  Incisional hernia not tender, not swollen or red.  Does not reduce  Musculoskeletal: She exhibits no edema or tenderness.  Lymphadenopathy:    She has no cervical adenopathy.  Neurological: She is alert. No cranial nerve deficit.  Oriented to self, place, and situation, not time.  Skin: Skin is warm and dry. She is not diaphoretic.  Right great toe with [place] discoloration.  Right 4th toe nail with overgrown of fungus. Yellow discoloration to all toenails. Callus to  plantar aspect of both feet. Normal sensation.  BPPP+1  Psychiatric: She has a normal mood and affect. Her behavior is normal.  Nursing note and vitals reviewed.   Labs reviewed:  Recent Labs  11/11/15 01/06/16  NA 132* 140  K 3.7 3.8  BUN 26*  --   CREATININE 1.5* 1.1   No results for input(s): AST, ALT, ALKPHOS, BILITOT, PROT, ALBUMIN in the last 8760 hours.  Recent Labs  11/11/15 01/28/16  WBC  5.0 4.0  HGB 11.3* 11.7*  HCT 34* 38  PLT 128* 135*   Lab Results  Component Value Date   TSH 0.70 06/14/2016   Lab Results  Component Value Date   HGBA1C 4.0 06/14/2016   Lab Results  Component Value Date   CHOL 175 09/16/2014   HDL 43 09/16/2014   LDLCALC 82 09/16/2014   TRIG 249 (A) 09/16/2014   CHOLHDL 3.5 07/09/2012    Significant Diagnostic Results in last 30 days:  No results found.   B12 701 on 06/14/16  Assessment/Plan  1. Onychomycosis Ciclopirox nail laquer apply to toenails qhs for 3 months Referral to podiatry for Thursday 1/24  2. Osteopenia of multiple sites Noted in Oct of 2017  Continue Ca with Vit D supplements  3. Gastroesophageal reflux disease without esophagitis No symptoms off protonix  4. constipation Continue Miralax 17 grams qd  5. Type 2 diabetes mellitus without complication, without long-term current use of insulin (HCC) Controlled without meds  6. Vascular dementia without behavioral disturbance Stable function MMSE score declines over time as expected Continue Namenda  7. AAA (abdominal aortic aneurysm) without rupture (HCC) After a discussion with Dr. Mariea Clonts we have opted not to further monitor this issue with ultrasound monitoring due to her progressive dementia/age.  She would not be a candidate for surgical intervention.  We will continue to monitor for symptoms and keep her BP under control  8. Immunization administered TDAP  Family/ staff Communication: discussed with resident and caretaker  Labs/tests ordered:   Due annually in May   Christy Satsuki Zillmer, [place] [phone]

## 2016-09-27 DIAGNOSIS — L84 Corns and callosities: Secondary | ICD-10-CM | POA: Diagnosis not present

## 2016-09-27 DIAGNOSIS — B351 Tinea unguium: Secondary | ICD-10-CM | POA: Diagnosis not present

## 2016-12-13 ENCOUNTER — Non-Acute Institutional Stay (SKILLED_NURSING_FACILITY): Payer: Medicare Other | Admitting: Adult Health

## 2016-12-13 ENCOUNTER — Encounter: Payer: Self-pay | Admitting: Adult Health

## 2016-12-13 DIAGNOSIS — K5901 Slow transit constipation: Secondary | ICD-10-CM | POA: Diagnosis not present

## 2016-12-13 DIAGNOSIS — N952 Postmenopausal atrophic vaginitis: Secondary | ICD-10-CM | POA: Diagnosis not present

## 2016-12-13 DIAGNOSIS — B373 Candidiasis of vulva and vagina: Secondary | ICD-10-CM

## 2016-12-13 DIAGNOSIS — K432 Incisional hernia without obstruction or gangrene: Secondary | ICD-10-CM | POA: Diagnosis not present

## 2016-12-13 DIAGNOSIS — B3731 Acute candidiasis of vulva and vagina: Secondary | ICD-10-CM

## 2016-12-13 NOTE — Progress Notes (Signed)
Location:  Occupational psychologist of Service:  SNF (31) Provider:   Cindi Carbon, ANP [place] [phone]   REED, Jonelle Sidle, DO  Patient Care Team: Gayland Curry, DO as PCP - General (Geriatric Medicine) Neldon Mc, MD as Consulting Physician (General Surgery) Particia Nearing, MD as Consulting Physician (Dermatology) Monna Fam, MD as Consulting Physician (Ophthalmology) Garlan Fair, MD as Consulting Physician (Gastroenterology) Kary Kos, MD as Consulting Physician (Neurosurgery) Elsie Saas, MD as Consulting Physician (Orthopedic Surgery) Selinda Orion, MD (Inactive) as Consulting Physician (Obstetrics and Gynecology) [facility] Rolm Bookbinder, MD as Consulting Physician (Dermatology)  Extended Emergency Contact Information Primary Emergency Contact: [place] of [place] Phone: [phone] Mobile Phone: [phone] Relation: Daughter Secondary Emergency Contact: Hubert Azure, [place] [address] Johnnette Litter of [place] Phone: [phone] Work Phone: [phone] Mobile Phone: [phone] Relation: Other  Code Status:  DNR Goals of care: Advanced Directive information Advanced Directives 12/13/2016  Does Patient Have a Medical Advance Directive? Yes  Type of Advance Directive Out of facility DNR (pink MOST or yellow form);Living will;Healthcare Power of Attorney  Does patient want to make changes to medical advance directive? -  Copy of [place] in Chart? Yes  Pre-existing out of facility DNR order (yellow form or pink MOST form) Pink MOST form placed in chart (order not valid for inpatient use);Yellow form placed in chart (order not valid for inpatient use)     Chief Complaint  Patient presents with  . Acute Visit    vaginal discharge    HPI:  Lorraine [name] is a 83 y.o. female seen today for an acute visit for vaginal discharge and rash present  for 3-4 days. The staff report that she has tried nystatin powder and 3 days of Mycolog cream with no improvement. She denies that the area is painful and it does not itch. She has a hx of atrophic vaginitis and lichen sclerosis and uses premarin cream three times a week.  She has an incisional hernia from a previous colectomy to repair a fistula and umbilical hernia. She denies any abd pain. She has dementia and is a poor historian. The nurse reports that she has regular BMs and uses miralax daily.   Past Medical History:  Diagnosis Date  . Abnormality of gait 2008   re: metoclopramide.   . Alopecia areata   . Anal fissure   . Arthritis   . B12 deficiency   . Cataracts, bilateral   . Closed fracture of lateral malleolus   . Depression   . Diabetes mellitus without complication ([place])   . Diffuse cystic mastopathy 1997   s/p right breast bx, benign  . Digestive-genital tract fistula, female   . Diverticulosis of colon (without mention of hemorrhage)   . Dizziness and giddiness    benign positional vertigo  . Dysphagia, pharyngoesophageal phase   . GERD (gastroesophageal reflux disease)   . Hyperlipidemia   . Hypertension   . Insomnia, unspecified   . Lumbago 2001   lumbar radiculopathy L3, spondylosis L2-3, 3-4. dx Dr.Cram  . Mild cognitive impairment with memory loss 12/16/2013  . Other lichen, not elsewhere classified 0932   Lichen sclerosis perineum/perianal  . Other malaise and fatigue   . Other psoriasis   . Other specified diseases of blood and blood-forming organs(289.89)    s/p Chelation treatment  . Skin cancer   . Thyroid disease   .  Unspecified constipation   . Ventral hernia, unspecified, without mention of obstruction or gangrene    Past Surgical History:  Procedure Laterality Date  . APPENDECTOMY/OVARIAN CYSTECTOMY  1950s  . BREAST BIOPSY  1997   right benign  . BREAST LUMPECTOMY  1997  . BUNIONECTOMY Bilateral 1975   twice  . CATARACT EXTRACTION W/  INTRAOCULAR LENS  IMPLANT, BILATERAL  1990  . COLECTOMY  11/2007   Sigmoid w/closure of fistula and repair of umbilical hernia  . CYSTECTOMY  1950's   brachial cleft  . HERNIA REPAIR    . TONSILLECTOMY AND ADENOIDECTOMY  1942  . VAGINAL HYSTERECTOMY  1998    Allergies  Allergen Reactions  . Codeine     Unknown   . Erythromycin     Unknown     Outpatient Encounter Prescriptions as of 12/13/2016  Medication Sig  . Ascorbic Acid (VITAMIN C) 1000 MG tablet Take 1,000 mg by mouth daily.  . ciclopirox (PENLAC) 8 % solution Apply topically at bedtime. Apply over nail and surrounding skin. Apply daily over previous coat. After seven (7) days, may remove with alcohol and continue cycle.  Marland Kitchen ELDERBERRY PO Take 335 mg by mouth daily.  Marland Kitchen acetaminophen (TYLENOL) 325 MG tablet Take 650 mg by mouth. Take two tablets twice daily as needed to reduce back pain   . Calcium Carbonate-Vitamin D 600-400 MG-UNIT per chew tablet Chew 1 tablet by mouth 2 (two) times daily.  . Cholecalciferol (VITAMIN D3) 2000 units TABS Take 1 tablet by mouth daily.  Marland Kitchen conjugated estrogens (PREMARIN) vaginal cream Place 1 Applicatorful vaginally 3 (three) times a week.  . cyanocobalamin 1000 MCG tablet Take 1,000 mcg by mouth daily.   Marland Kitchen docusate sodium (COLACE) 100 MG capsule Take 100 mg by mouth 2 (two) times daily.  . hydrocortisone cream 1 % Apply 1 application topically 2 (two) times daily. To chest as needed  . ipratropium-albuterol (DUONEB) 0.5-2.5 (3) MG/3ML SOLN Take 3 mLs by nebulization every 6 (six) hours as needed.  Marland Kitchen levothyroxine (SYNTHROID, LEVOTHROID) 75 MCG tablet Take 62.5 mcg by mouth daily before breakfast.   . memantine (NAMENDA) 10 MG tablet Take 10 mg by mouth 2 (two) times daily.  . multivitamin-iron-minerals-folic acid (CENTRUM) chewable tablet Chew 1 tablet by mouth daily.  . Omega-3 Fatty Acids (FISH OIL) 1000 MG CPDR Take 1,000 mg by mouth daily.  . polyethylene glycol powder (GLYCOLAX/MIRALAX)  powder Take 17 g by mouth daily.  . Probiotic Product (RISA-BID PROBIOTIC) TABS Take 1 tablet by mouth daily.  . Turmeric 500 MG TABS Take 1 tablet by mouth daily.   No facility-administered encounter medications on file as of 12/13/2016.     Review of Systems  Constitutional: Positive for unexpected weight change (gaining). Negative for activity change, appetite change, chills, diaphoresis, fatigue and fever.  HENT: Negative for congestion.   Respiratory: Negative for cough, shortness of breath and wheezing.   Cardiovascular: Negative for chest pain, palpitations and leg swelling.  Gastrointestinal: Negative for abdominal distention, abdominal pain, constipation and diarrhea.  Genitourinary: Positive for vaginal discharge. Negative for difficulty urinating, dysuria, flank pain, frequency, hematuria, menstrual problem, pelvic pain, vaginal bleeding and vaginal pain.  Musculoskeletal: Positive for gait problem. Negative for arthralgias, back pain, joint swelling and myalgias.  Skin: Positive for rash.  Neurological: Negative for dizziness, tremors, seizures, syncope, facial asymmetry, speech difficulty, weakness, light-headedness, numbness and headaches.  Psychiatric/Behavioral: Positive for confusion. Negative for agitation and behavioral problems.    Immunization History  Administered Date(s) Administered  . Influenza Inj Mdck Quad Pf 06/21/2016  . Influenza Whole 06/03/2012, 06/17/2013  . Influenza-Unspecified 06/09/2014, 06/16/2015  . PPD Test 09/29/2012  . Pneumococcal Conjugate-13 06/14/2016  . Pneumococcal Polysaccharide-23 09/03/1998  . Td 09/03/1998, 09/25/2016  . Tdap 09/25/2016  . Zoster 09/04/2007   Pertinent  Health Maintenance Due  Topic Date Due  . OPHTHALMOLOGY EXAM  06/30/1937  . URINE MICROALBUMIN  06/30/1937  . HEMOGLOBIN A1C  12/13/2016  . INFLUENZA VACCINE  04/03/2017  . FOOT EXAM  09/24/2017  . DEXA SCAN  Completed  . PNA vac Low Risk Adult  Completed    Fall Risk  06/19/2016 01/19/2015 12/16/2013  Falls in the past year? Yes Yes No  Number falls in past yr: 1 1 -  Injury with Fall? No No -  Risk for fall due to : Other (Comment) - -  Follow up Education provided - -   Functional Status Survey:    Vitals:   12/13/16 1601  BP: 118/69  Pulse: 76   There is no height or weight on file to calculate BMI. Physical Exam  Constitutional: No distress.  HENT:  Head: Normocephalic and atraumatic.  Neck: No JVD present.  Cardiovascular: Normal rate and regular rhythm.   No murmur heard. Pulmonary/Chest: Effort normal and breath sounds normal. No respiratory distress. She has no wheezes.  Abdominal: Soft. Bowel sounds are normal. She exhibits no distension. There is no tenderness.  Incisional hernia, reduces. Not tender, swollen, or red.    Genitourinary: There is rash on the right labia. There is no tenderness, lesion or injury on the right labia. There is rash on the left labia. There is no tenderness, lesion or injury on the left labia. There is erythema in the vagina. Vaginal discharge found.  Genitourinary Comments: Atrophic changes noted  Neurological: She is alert.  Oriented to self and place not time, able to follow commands.  Skin: Skin is warm and dry. Rash noted. She is not diaphoretic. There is erythema (noted to labia and rectume with some white patches to the rectum).  Psychiatric: She has a normal mood and affect.    Labs reviewed:  Recent Labs  01/06/16  NA 140  K 3.8  CREATININE 1.1   No results for input(s): AST, ALT, ALKPHOS, BILITOT, PROT, ALBUMIN in the last 8760 hours.  Recent Labs  01/28/16  WBC 4.0  HGB 11.7*  HCT 38  PLT 135*   Lab Results  Component Value Date   TSH 0.70 06/14/2016   Lab Results  Component Value Date   HGBA1C 4.0 06/14/2016   Lab Results  Component Value Date   CHOL 175 09/16/2014   HDL 43 09/16/2014   LDLCALC 82 09/16/2014   TRIG 249 (A) 09/16/2014   CHOLHDL 3.5  07/09/2012    Significant Diagnostic Results in last 30 days:  No results found.  Assessment/Plan 1. Vaginitis due to Candida Continue Mycolog II BID for 14 days Diflucan 150 mg x 1 dose  2. Incisional hernia, without obstruction or gangrene Continue to monitor, asymptomatic  3. Atrophic vaginitis Continue premarin 3 x week  4. Slow transit constipation Continue miralax 17 grams qd   Family/ staff Communication: discussed with staff  Labs/tests ordered: due in May

## 2016-12-24 ENCOUNTER — Encounter: Payer: Self-pay | Admitting: Adult Health

## 2016-12-24 ENCOUNTER — Non-Acute Institutional Stay (SKILLED_NURSING_FACILITY): Payer: Medicare Other | Admitting: Adult Health

## 2016-12-24 DIAGNOSIS — N760 Acute vaginitis: Secondary | ICD-10-CM

## 2016-12-24 DIAGNOSIS — L900 Lichen sclerosus et atrophicus: Secondary | ICD-10-CM | POA: Diagnosis not present

## 2016-12-24 NOTE — Progress Notes (Signed)
Location:  Occupational psychologist of Service:  SNF (31) Provider:   Cindi Carbon, ANP [place] [phone]   REED, Jonelle Sidle, DO  Patient Care Team: Gayland Curry, DO as PCP - General (Geriatric Medicine) Neldon Mc, MD as Consulting Physician (General Surgery) Particia Nearing, MD as Consulting Physician (Dermatology) Monna Fam, MD as Consulting Physician (Ophthalmology) Garlan Fair, MD as Consulting Physician (Gastroenterology) Kary Kos, MD as Consulting Physician (Neurosurgery) Elsie Saas, MD as Consulting Physician (Orthopedic Surgery) Selinda Orion, MD (Inactive) as Consulting Physician (Obstetrics and Gynecology) Well [facility] Rolm Bookbinder, MD as Consulting Physician (Dermatology)  Extended Emergency Contact Information Primary Emergency Contact: [place] of [place] Phone: [phone] Mobile Phone: [phone] Relation: Daughter Secondary Emergency Contact: Hubert Azure, [place] [address] Johnnette Litter of [place] Phone: [phone] Work Phone: [phone] Mobile Phone: [phone] Relation: Other  Code Status:  DNR Goals of care: Advanced Directive information Advanced Directives 12/24/2016  Does Patient Have a Medical Advance Directive? Yes  Type of Advance Directive Out of facility DNR (pink MOST or yellow form);Living will;Healthcare Power of Attorney  Does patient want to make changes to medical advance directive? -  Copy of [place] in Chart? Yes  Pre-existing out of facility DNR order (yellow form or pink MOST form) Pink MOST form placed in chart (order not valid for inpatient use);Yellow form placed in chart (order not valid for inpatient use)     Chief Complaint  Patient presents with  . Acute Visit    f/u vaginal complaint    HPI:  Pt is a 83 y.o. female seen today for an acute visit for vaginal rash present for 2  weeks. She has tried 1 week of Mycolog cream and 1 dose of Diflucan 150 mg. She has a hx of atrophic vaginitis and lichen sclerosis and uses premarin cream three times a week.  The staff deny that she is uncomfortable or has any symptoms of pruritis, or urinary symptoms. She has previously been on clobetasol for lichen sclerosis.  There are no reports of fever or vaginal discharge.    Past Medical History:  Diagnosis Date  . Abnormality of gait 2008   re: metoclopramide.   . Alopecia areata   . Anal fissure   . Arthritis   . B12 deficiency   . Cataracts, bilateral   . Closed fracture of lateral malleolus   . Depression   . Diabetes mellitus without complication ([place])   . Diffuse cystic mastopathy 1997   s/p right breast bx, benign  . Digestive-genital tract fistula, female   . Diverticulosis of colon (without mention of hemorrhage)   . Dizziness and giddiness    benign positional vertigo  . Dysphagia, pharyngoesophageal phase   . GERD (gastroesophageal reflux disease)   . Hyperlipidemia   . Hypertension   . Insomnia, unspecified   . Lumbago 2001   lumbar radiculopathy L3, spondylosis L2-3, 3-4. dx Dr.Cram  . Mild cognitive impairment with memory loss 12/16/2013  . Other lichen, not elsewhere classified 2330   Lichen sclerosis perineum/perianal  . Other malaise and fatigue   . Other psoriasis   . Other specified diseases of blood and blood-forming organs(289.89)    s/p Chelation treatment  . Skin cancer   . Thyroid disease   . Unspecified constipation   . Ventral hernia, unspecified, without mention of obstruction or gangrene    Past  Surgical History:  Procedure Laterality Date  . APPENDECTOMY/OVARIAN CYSTECTOMY  1950s  . BREAST BIOPSY  1997   right benign  . BREAST LUMPECTOMY  1997  . BUNIONECTOMY Bilateral 1975   twice  . CATARACT EXTRACTION W/ INTRAOCULAR LENS  IMPLANT, BILATERAL  1990  . COLECTOMY  11/2007   Sigmoid w/closure of fistula and repair of umbilical  hernia  . CYSTECTOMY  1950's   brachial cleft  . HERNIA REPAIR    . TONSILLECTOMY AND ADENOIDECTOMY  1942  . VAGINAL HYSTERECTOMY  1998    Allergies  Allergen Reactions  . Codeine     Unknown   . Erythromycin     Unknown     Outpatient Encounter Prescriptions as of 12/24/2016  Medication Sig  . nystatin-triamcinolone (MYCOLOG II) cream Apply 1 application topically 2 (two) times daily.  Marland Kitchen acetaminophen (TYLENOL) 325 MG tablet Take 650 mg by mouth. Take two tablets twice daily as needed to reduce back pain   . Ascorbic Acid (VITAMIN C) 1000 MG tablet Take 1,000 mg by mouth daily.  . Calcium Carbonate-Vitamin D 600-400 MG-UNIT per chew tablet Chew 1 tablet by mouth 2 (two) times daily.  . Cholecalciferol (VITAMIN D3) 2000 units TABS Take 1 tablet by mouth daily.  . ciclopirox (PENLAC) 8 % solution Apply topically at bedtime. Apply over nail and surrounding skin. Apply daily over previous coat. After seven (7) days, may remove with alcohol and continue cycle.  . conjugated estrogens (PREMARIN) vaginal cream Place 1 Applicatorful vaginally 3 (three) times a week.  . cyanocobalamin 1000 MCG tablet Take 1,000 mcg by mouth daily.   Marland Kitchen docusate sodium (COLACE) 100 MG capsule Take 100 mg by mouth 2 (two) times daily.  Marland Kitchen ELDERBERRY PO Take 335 mg by mouth daily.  . hydrocortisone cream 1 % Apply 1 application topically 2 (two) times daily. To chest as needed  . ipratropium-albuterol (DUONEB) 0.5-2.5 (3) MG/3ML SOLN Take 3 mLs by nebulization every 6 (six) hours as needed.  Marland Kitchen levothyroxine (SYNTHROID, LEVOTHROID) 75 MCG tablet Take 62.5 mcg by mouth daily before breakfast.   . memantine (NAMENDA) 10 MG tablet Take 10 mg by mouth 2 (two) times daily.  . multivitamin-iron-minerals-folic acid (CENTRUM) chewable tablet Chew 1 tablet by mouth daily.  . Omega-3 Fatty Acids (FISH OIL) 1000 MG CPDR Take 1,000 mg by mouth daily.  . polyethylene glycol powder (GLYCOLAX/MIRALAX) powder Take 17 g by mouth  daily.  . Probiotic Product (RISA-BID PROBIOTIC) TABS Take 1 tablet by mouth daily.  . Turmeric 500 MG TABS Take 1 tablet by mouth daily.   No facility-administered encounter medications on file as of 12/24/2016.     Review of Systems  Constitutional: Positive for unexpected weight change (gaining). Negative for activity change, appetite change, chills, diaphoresis, fatigue and fever.  HENT: Negative for congestion.   Respiratory: Negative for cough, shortness of breath and wheezing.   Cardiovascular: Negative for chest pain, palpitations and leg swelling.  Gastrointestinal: Negative for abdominal distention, abdominal pain, constipation and diarrhea.  Genitourinary: Positive for vaginal discharge. Negative for difficulty urinating, dysuria, flank pain, frequency, hematuria, menstrual problem, pelvic pain, vaginal bleeding and vaginal pain.  Musculoskeletal: Positive for gait problem. Negative for arthralgias, back pain, joint swelling and myalgias.  Skin: Positive for rash.  Neurological: Negative for dizziness, tremors, seizures, syncope, facial asymmetry, speech difficulty, weakness, light-headedness, numbness and headaches.  Psychiatric/Behavioral: Positive for confusion. Negative for agitation and behavioral problems.    Immunization History  Administered Date(s) Administered  .  Influenza Inj Mdck Quad Pf 06/21/2016  . Influenza Whole 06/03/2012, 06/17/2013  . Influenza-Unspecified 06/09/2014, 06/16/2015  . PPD Test 09/29/2012  . Pneumococcal Conjugate-13 06/14/2016  . Pneumococcal Polysaccharide-23 09/03/1998  . Td 09/03/1998, 09/25/2016  . Tdap 09/25/2016  . Zoster 09/04/2007   Pertinent  Health Maintenance Due  Topic Date Due  . OPHTHALMOLOGY EXAM  06/30/1937  . URINE MICROALBUMIN  06/30/1937  . HEMOGLOBIN A1C  12/13/2016  . INFLUENZA VACCINE  04/03/2017  . FOOT EXAM  09/24/2017  . DEXA SCAN  Completed  . PNA vac Low Risk Adult  Completed   Fall Risk  06/19/2016  01/19/2015 12/16/2013  Falls in the past year? Yes Yes No  Number falls in past yr: 1 1 -  Injury with Fall? No No -  Risk for fall due to : Other (Comment) - -  Follow up Education provided - -   Functional Status Survey:    There were no vitals filed for this visit. There is no height or weight on file to calculate BMI. Physical Exam  Constitutional: No distress.  HENT:  Head: Normocephalic and atraumatic.  Neck: No JVD present.  Cardiovascular: Normal rate and regular rhythm.   No murmur heard. Pulmonary/Chest: Effort normal and breath sounds normal. No respiratory distress. She has no wheezes.  Abdominal: Soft. Bowel sounds are normal. She exhibits no distension. There is no tenderness.  Incisional hernia, reduces. Not tender, swollen, or red.    Genitourinary: There is rash on the right labia. There is no tenderness, lesion or injury on the right labia. There is rash on the left labia. There is no tenderness, lesion or injury on the left labia. There is erythema in the vagina. Vaginal discharge found.  Genitourinary Comments: Atrophic changes noted  Neurological: She is alert.  Oriented to self and place not time, able to follow commands.  Skin: Skin is warm and dry. Rash noted. She is not diaphoretic. There is erythema (noted to labia and rectume with some white patches vulva and rectum).  Psychiatric: She has a normal mood and affect.    Labs reviewed:  Recent Labs  01/06/16  NA 140  K 3.8  CREATININE 1.1   No results for input(s): AST, ALT, ALKPHOS, BILITOT, PROT, ALBUMIN in the last 8760 hours.  Recent Labs  01/28/16  WBC 4.0  HGB 11.7*  HCT 38  PLT 135*   Lab Results  Component Value Date   TSH 0.70 06/14/2016   Lab Results  Component Value Date   HGBA1C 4.0 06/14/2016   Lab Results  Component Value Date   CHOL 175 09/16/2014   HDL 43 09/16/2014   LDLCALC 82 09/16/2014   TRIG 249 (A) 09/16/2014   CHOLHDL 3.5 07/09/2012    Significant Diagnostic  Results in last 30 days:  No results found.  Assessment/Plan 1. Vaginitis due to Candida D/C Mycolog Diflucan 150 mg x 1 dose then again in 72 hrs.  2. Lichen sclerosus Clobetasol ointment qhs x 6 weeks If no improvement refer back to GYN   Family/ staff Communication: discussed with staff  Labs/tests ordered: due in May

## 2017-01-11 DIAGNOSIS — D509 Iron deficiency anemia, unspecified: Secondary | ICD-10-CM | POA: Diagnosis not present

## 2017-01-11 LAB — HEPATIC FUNCTION PANEL
ALT: 10 (ref 7–35)
AST: 18 (ref 13–35)
Alkaline Phosphatase: 39 (ref 25–125)
Bilirubin, Total: 0.4

## 2017-01-11 LAB — BASIC METABOLIC PANEL
BUN: 25 — AB (ref 4–21)
Creatinine: 1.2 — AB (ref 0.5–1.1)
Glucose: 99
Potassium: 4.3 (ref 3.4–5.3)
Sodium: 141 (ref 137–147)

## 2017-01-11 LAB — CBC AND DIFFERENTIAL
HCT: 38 (ref 36–46)
Hemoglobin: 12.5 (ref 12.0–16.0)
Platelets: 142 — AB (ref 150–399)
WBC: 5.3

## 2017-01-22 DIAGNOSIS — B351 Tinea unguium: Secondary | ICD-10-CM | POA: Diagnosis not present

## 2017-01-22 DIAGNOSIS — L84 Corns and callosities: Secondary | ICD-10-CM | POA: Diagnosis not present

## 2017-03-01 ENCOUNTER — Encounter: Payer: Self-pay | Admitting: Adult Health

## 2017-03-01 ENCOUNTER — Non-Acute Institutional Stay (SKILLED_NURSING_FACILITY): Payer: Medicare Other | Admitting: Adult Health

## 2017-03-01 DIAGNOSIS — N761 Subacute and chronic vaginitis: Secondary | ICD-10-CM | POA: Diagnosis not present

## 2017-03-01 DIAGNOSIS — L900 Lichen sclerosus et atrophicus: Secondary | ICD-10-CM

## 2017-03-01 NOTE — Progress Notes (Addendum)
Location:  [facility]up[facility] psychologist of Service:  SNF (31) Provider:   Cindi Carbon, ANP [place] [phone]   Gayland Curry, DO  Patient Care Team: Gayland Curry, DO as PCP - General (Geriatric Medicine) Neldon Mc, MD as Consulting Physician (General Surgery) Particia Nearing, MD as Consulting Physician (Dermatology) Monna Fam, MD as Consulting Physician (Ophthalmology) Garlan Fair, MD as Consulting Physician (Gastroenterology) Kary Kos, MD as Consulting Physician (Neurosurgery) Elsie Saas, MD as Consulting Physician (Orthopedic Surgery) Selinda Orion, MD (Inactive) as Consulting Physician (Obstetrics and Gynecology) [place], [place] Clovis Pu, MD as Consulting Physician (Dermatology)  Extended Emergency Contact Information Primary Emergency Contact: [place] of [place] Phone: [phone] Mobile Phone: [phone] Relation: Daughter Secondary Emergency Contact: Hubert Azure, [place] [address] Johnnette Litter of [place] Phone: [phone] Work Phone: [phone] Mobile Phone: [phone] Relation: Other  Code Status:  DNR Goals of care: Advanced Directive information Advanced Directives 12/24/2016  Does Patient Have a Medical Advance Directive? Yes  Type of Advance Directive Out of facility DNR (pink MOST or yellow form);Living will;Healthcare Power of Attorney  Does patient want to make changes to medical advance directive? -  Copy of [place] in Chart? Yes  Pre-existing out of facility DNR order (yellow form or pink MOST form) Pink MOST form placed in chart (order not valid for inpatient use);Yellow form placed in chart (order not valid for inpatient use)     Chief Complaint  Patient presents with  . Acute Visit    vaginal irritation    HPI:  Pt is a 83 y.o. female seen today for an acute visit for vaginal rash noticed  today. She was treated with clobetasol and diflucan in April for lichen sclerosus. It apparently got better and later worsened again but I have not been able to obtain definitive answers from the staff. She denies pain but her caregiver reports that its itchy.  She has no urinary symptoms. NO discharge, no fever.    Past Medical History:  Diagnosis Date  . Abnormality of gait 2008   re: metoclopramide.   . Alopecia areata   . Anal fissure   . Arthritis   . B12 deficiency   . Cataracts, bilateral   . Closed fracture of lateral malleolus   . Depression   . Diabetes mellitus without complication ([place])   . Diffuse cystic mastopathy 1997   s/p right breast bx, benign  . Digestive-genital tract fistula, female   . Diverticulosis of colon (without mention of hemorrhage)   . Dizziness and giddiness    benign positional vertigo  . Dysphagia, pharyngoesophageal phase   . GERD (gastroesophageal reflux disease)   . Hyperlipidemia   . Hypertension   . Insomnia, unspecified   . Lumbago 2001   lumbar radiculopathy L3, spondylosis L2-3, 3-4. dx Dr.Cram  . Mild cognitive impairment with memory loss 12/16/2013  . Other lichen, not elsewhere classified 9798   Lichen sclerosis perineum/perianal  . Other malaise and fatigue   . Other psoriasis   . Other specified diseases of blood and blood-forming organs(289.89)    s/p Chelation treatment  . Skin cancer   . Thyroid disease   . Unspecified constipation   . Ventral hernia, unspecified, without mention of obstruction or gangrene    Past Surgical History:  Procedure Laterality Date  . APPENDECTOMY/OVARIAN CYSTECTOMY  1950s  . BREAST BIOPSY  1997  right benign  . BREAST LUMPECTOMY  1997  . BUNIONECTOMY Bilateral 1975   twice  . CATARACT EXTRACTION W/ INTRAOCULAR LENS  IMPLANT, BILATERAL  1990  . COLECTOMY  11/2007   Sigmoid w/closure of fistula and repair of umbilical hernia  . CYSTECTOMY  1950's   brachial cleft  . HERNIA REPAIR    .  TONSILLECTOMY AND ADENOIDECTOMY  1942  . VAGINAL HYSTERECTOMY  1998    Allergies  Allergen Reactions  . Codeine     Unknown   . Erythromycin     Unknown     Outpatient Encounter Prescriptions as of 03/01/2017  Medication Sig  . acetaminophen (TYLENOL) 325 MG tablet Take 650 mg by mouth. Take two tablets twice daily as needed to reduce back pain   . Ascorbic Acid (VITAMIN C) 1000 MG tablet Take 1,000 mg by mouth daily.  . Calcium Carbonate-Vitamin D 600-400 MG-UNIT per chew tablet Chew 1 tablet by mouth 2 (two) times daily.  . Cholecalciferol (VITAMIN D3) 2000 units TABS Take 1 tablet by mouth daily.  . ciclopirox (PENLAC) 8 % solution Apply topically at bedtime. Apply over nail and surrounding skin. Apply daily over previous coat. After seven (7) days, may remove with alcohol and continue cycle.  . conjugated estrogens (PREMARIN) vaginal cream Place 1 Applicatorful vaginally 3 (three) times a week.  . cyanocobalamin 1000 MCG tablet Take 1,000 mcg by mouth daily.   Marland Kitchen docusate sodium (COLACE) 100 MG capsule Take 100 mg by mouth 2 (two) times daily.  Marland Kitchen ELDERBERRY PO Take 335 mg by mouth daily.  . hydrocortisone cream 1 % Apply 1 application topically 2 (two) times daily. To chest as needed  . ipratropium-albuterol (DUONEB) 0.5-2.5 (3) MG/3ML SOLN Take 3 mLs by nebulization every 6 (six) hours as needed.  Marland Kitchen levothyroxine (SYNTHROID, LEVOTHROID) 75 MCG tablet Take 62.5 mcg by mouth daily before breakfast.   . memantine (NAMENDA) 10 MG tablet Take 10 mg by mouth 2 (two) times daily.  . multivitamin-iron-minerals-folic acid (CENTRUM) chewable tablet Chew 1 tablet by mouth daily.  Marland Kitchen nystatin-triamcinolone (MYCOLOG II) cream Apply 1 application topically 2 (two) times daily.  . Omega-3 Fatty Acids (FISH OIL) 1000 MG CPDR Take 1,000 mg by mouth daily.  . polyethylene glycol powder (GLYCOLAX/MIRALAX) powder Take 17 g by mouth daily.  . Probiotic Product (RISA-BID PROBIOTIC) TABS Take 1 tablet by  mouth daily.  . Turmeric 500 MG TABS Take 1 tablet by mouth daily.   No facility-administered encounter medications on file as of 03/01/2017.     Review of Systems  Constitutional: Positive for unexpected weight change (gaining). Negative for activity change, appetite change, chills, diaphoresis, fatigue and fever.  HENT: Negative for congestion.   Respiratory: Negative for cough, shortness of breath and wheezing.   Cardiovascular: Negative for chest pain, palpitations and leg swelling.  Gastrointestinal: Negative for abdominal distention, abdominal pain, constipation and diarrhea.  Genitourinary: Negative for difficulty urinating, dysuria, flank pain, frequency, hematuria, menstrual problem, pelvic pain, vaginal bleeding, vaginal discharge and vaginal pain.  Musculoskeletal: Positive for gait problem. Negative for arthralgias, back pain, joint swelling and myalgias.  Skin: Positive for rash.  Neurological: Negative for dizziness, tremors, seizures, syncope, facial asymmetry, speech difficulty, weakness, light-headedness, numbness and headaches.  Psychiatric/Behavioral: Positive for confusion. Negative for agitation and behavioral problems.    Immunization History  Administered Date(s) Administered  . Influenza Inj Mdck Quad Pf 06/21/2016  . Influenza Whole 06/03/2012, 06/17/2013  . Influenza-Unspecified 06/09/2014, 06/16/2015  . PPD Test  09/29/2012  . Pneumococcal Conjugate-13 06/14/2016  . Pneumococcal Polysaccharide-23 09/03/1998  . Td 09/03/1998, 09/25/2016  . Tdap 09/25/2016  . Zoster 09/04/2007   Pertinent  Health Maintenance Due  Topic Date Due  . OPHTHALMOLOGY EXAM  06/30/1937  . URINE MICROALBUMIN  06/30/1937  . HEMOGLOBIN A1C  12/13/2016  . INFLUENZA VACCINE  04/03/2017  . FOOT EXAM  09/24/2017  . DEXA SCAN  Completed  . PNA vac Low Risk Adult  Completed   Fall Risk  06/19/2016 01/19/2015 12/16/2013  Falls in the past year? Yes Yes No  Number falls in past yr: 1 1 -    Injury with Fall? No No -  Risk for fall due to : Other (Comment) - -  Follow up Education provided - -   Functional Status Survey:    There were no vitals filed for this visit. There is no height or weight on file to calculate BMI. Physical Exam  Constitutional: No distress.  HENT:  Head: Normocephalic and atraumatic.  Neck: No JVD present.  Cardiovascular: Normal rate and regular rhythm.   No murmur heard. Pulmonary/Chest: Effort normal and breath sounds normal. No respiratory distress. She has no wheezes.  Abdominal: Soft. Bowel sounds are normal. She exhibits no distension. There is no tenderness.  Incisional hernia, reduces. Not tender, swollen, or red.    Genitourinary: There is rash on the right labia. There is no tenderness, lesion or injury on the right labia. There is rash on the left labia. There is no tenderness, lesion or injury on the left labia. There is erythema in the vagina.  Genitourinary Comments: Atrophic changes noted  Neurological: She is alert.  Oriented to self and place not time, able to follow commands.  Skin: Skin is warm and dry. Rash noted. She is not diaphoretic. There is erythema (noted to labia and rectum).  Psychiatric: She has a normal mood and affect.    Labs reviewed: No results for input(s): NA, K, CL, CO2, GLUCOSE, BUN, CREATININE, CALCIUM, MG, PHOS in the last 8760 hours. No results for input(s): AST, ALT, ALKPHOS, BILITOT, PROT, ALBUMIN in the last 8760 hours. No results for input(s): WBC, NEUTROABS, HGB, HCT, MCV, PLT in the last 8760 hours. Lab Results  Component Value Date   TSH 0.70 06/14/2016   Lab Results  Component Value Date   HGBA1C 4.0 06/14/2016   Lab Results  Component Value Date   CHOL 175 09/16/2014   HDL 43 09/16/2014   LDLCALC 82 09/16/2014   TRIG 249 (A) 09/16/2014   CHOLHDL 3.5 07/09/2012    Significant Diagnostic Results in last 30 days:  No results found.  Assessment/Plan 1. Subacute vaginitis Diflucan  150 mg x 1 dose then again in 72 hrs.  2. Lichen sclerosus Clobetasol ointment qhs x 6 weeks If no improvement refer back to GYN The nurse on memory care has agreed to check this area weekly and monitor for improvement  Family/ staff Communication: discussed with staff  Labs/tests ordered: CBC BMP (routine labs are due)

## 2017-03-04 LAB — BASIC METABOLIC PANEL
BUN: 19 (ref 4–21)
Creatinine: 1.3 — AB (ref 0.5–1.1)
Glucose: 139
Potassium: 4 (ref 3.4–5.3)
Sodium: 140 (ref 137–147)

## 2017-03-04 LAB — CBC AND DIFFERENTIAL
HCT: 41 (ref 36–46)
Hemoglobin: 13.4 (ref 12.0–16.0)
Platelets: 155 (ref 150–399)
WBC: 5.4

## 2017-03-05 ENCOUNTER — Non-Acute Institutional Stay (SKILLED_NURSING_FACILITY): Payer: Medicare Other | Admitting: Internal Medicine

## 2017-03-05 ENCOUNTER — Encounter: Payer: Self-pay | Admitting: Internal Medicine

## 2017-03-05 DIAGNOSIS — F0150 Vascular dementia without behavioral disturbance: Secondary | ICD-10-CM

## 2017-03-05 DIAGNOSIS — L84 Corns and callosities: Secondary | ICD-10-CM | POA: Diagnosis not present

## 2017-03-05 DIAGNOSIS — N761 Subacute and chronic vaginitis: Secondary | ICD-10-CM | POA: Diagnosis not present

## 2017-03-05 DIAGNOSIS — E039 Hypothyroidism, unspecified: Secondary | ICD-10-CM | POA: Diagnosis not present

## 2017-03-05 DIAGNOSIS — K5901 Slow transit constipation: Secondary | ICD-10-CM | POA: Diagnosis not present

## 2017-03-05 DIAGNOSIS — M8589 Other specified disorders of bone density and structure, multiple sites: Secondary | ICD-10-CM | POA: Diagnosis not present

## 2017-03-05 DIAGNOSIS — L900 Lichen sclerosus et atrophicus: Secondary | ICD-10-CM

## 2017-03-05 NOTE — Progress Notes (Signed)
Patient ID: Lorraine [name], female   DOB: 08-31-1927, 83 y.o.   MRN: [id number]  Location:  [facility] Room Number: 242 memory care Place of Service:  SNF [phone]) Provider:   Gayland Curry, DO  Patient Care Team: Gayland Curry, DO as PCP - General (Geriatric Medicine) Neldon Mc, MD as Consulting Physician (General Surgery) Particia Nearing, MD as Consulting Physician (Dermatology) Monna Fam, MD as Consulting Physician (Ophthalmology) Garlan Fair, MD as Consulting Physician (Gastroenterology) Kary Kos, MD as Consulting Physician (Neurosurgery) Elsie Saas, MD as Consulting Physician (Orthopedic Surgery) Selinda Orion, MD (Inactive) as Consulting Physician (Obstetrics and Gynecology) [place], Well Clovis Pu, MD as Consulting Physician (Dermatology)  Extended Emergency Contact Information Primary Emergency Contact: [place] of [place] Phone: [phone] Mobile Phone: [phone] Relation: Daughter Secondary Emergency Contact: Hubert Azure, [place] [address] Johnnette Litter of [place] Phone: [phone] Work Phone: [phone] Mobile Phone: [phone] Relation: Other  Code Status:  DNR Goals of care: Advanced Directive information Advanced Directives 03/05/2017  Does Patient Have a Medical Advance Directive? Yes  Type of Advance Directive Out of facility DNR (pink MOST or yellow form);[place];Living will  Does patient want to make changes to medical advance directive? -  Copy of [place] in Chart? Yes  Pre-existing out of facility DNR order (yellow form or pink MOST form) Yellow form placed in chart (order not valid for inpatient use);Pink MOST form placed in chart (order not valid for inpatient use)   Chief Complaint  Patient presents with  . Follow-up    vaginal erythema    HPI:  Pt is a 83 y.o. female  With  alzheimer's living in memory care seen today for follow-up on lichen sclerosis and vaginitis.  Nursing staff report that her erythema is not better a week later after initiating the clobetasol. Sounds like this has been longstanding and not getting better.  She continues with irritation and diflucan has not helped either.  Staff were advised to check weekly and schedule gyn appt if not improvement.  NP had asked that when I saw her to reassess also.    Constipation:  Having regular bms with miralax.  Dementia:  Remains ambulatory with rollator, but unsteady.  Has caregiver with her in afternoons.  They play cards.  Weight is actually trending up from 142 lbs a year ago to 154 lbs this year in July.    Bones:  On ca with D and additional D.  Ambulates with rollator.    CBC and BMP done were wnl on 7/2.    Her calluses are being managed by podiatry.    Past Medical History:  Diagnosis Date  . Abnormality of gait 2008   re: metoclopramide.   . Alopecia areata   . Anal fissure   . Arthritis   . B12 deficiency   . Cataracts, bilateral   . Closed fracture of lateral malleolus   . Depression   . Diabetes mellitus without complication ([place])   . Diffuse cystic mastopathy 1997   s/p right breast bx, benign  . Digestive-genital tract fistula, female   . Diverticulosis of colon (without mention of hemorrhage)   . Dizziness and giddiness    benign positional vertigo  . Dysphagia, pharyngoesophageal phase   . GERD (gastroesophageal reflux disease)   . Hyperlipidemia   . Hypertension   . Insomnia, unspecified   .  Lumbago 2001   lumbar radiculopathy L3, spondylosis L2-3, 3-4. dx Dr.Cram  . Mild cognitive impairment with memory loss 12/16/2013  . Other lichen, not elsewhere classified 8546   Lichen sclerosis perineum/perianal  . Other malaise and fatigue   . Other psoriasis   . Other specified diseases of blood and blood-forming organs(289.89)    s/p Chelation treatment  . Skin cancer   .  Thyroid disease   . Unspecified constipation   . Ventral hernia, unspecified, without mention of obstruction or gangrene    Past Surgical History:  Procedure Laterality Date  . APPENDECTOMY/OVARIAN CYSTECTOMY  1950s  . BREAST BIOPSY  1997   right benign  . BREAST LUMPECTOMY  1997  . BUNIONECTOMY Bilateral 1975   twice  . CATARACT EXTRACTION W/ INTRAOCULAR LENS  IMPLANT, BILATERAL  1990  . COLECTOMY  11/2007   Sigmoid w/closure of fistula and repair of umbilical hernia  . CYSTECTOMY  1950's   brachial cleft  . HERNIA REPAIR    . TONSILLECTOMY AND ADENOIDECTOMY  1942  . VAGINAL HYSTERECTOMY  1998    Allergies  Allergen Reactions  . Codeine     Unknown   . Erythromycin     Unknown     Outpatient Encounter Prescriptions as of 03/05/2017  Medication Sig  . Ascorbic Acid (VITAMIN C) 1000 MG tablet Take 1,000 mg by mouth daily.  . Calcium Carbonate-Vitamin D 600-400 MG-UNIT per chew tablet Chew 1 tablet by mouth 2 (two) times daily.  . Cholecalciferol (VITAMIN D3) 2000 units TABS Take 1 tablet by mouth daily.  . clobetasol ointment (TEMOVATE) 0.05 % Apply ointment to vaginal & rectal area once daily for 6 weeks  . conjugated estrogens (PREMARIN) vaginal cream Place 1 Applicatorful vaginally 3 (three) times a week.  . cyanocobalamin 1000 MCG tablet Take 1,000 mcg by mouth daily.   Marland Kitchen docusate sodium (COLACE) 100 MG capsule Take 100 mg by mouth 2 (two) times daily.  . hydrocortisone cream 1 % Apply 1 application topically 2 (two) times daily. To chest as needed  . levothyroxine (SYNTHROID, LEVOTHROID) 75 MCG tablet Take 62.5 mcg by mouth daily before breakfast.   . memantine (NAMENDA) 10 MG tablet Take 10 mg by mouth 2 (two) times daily.  . multivitamin-iron-minerals-folic acid (CENTRUM) chewable tablet Chew 1 tablet by mouth daily.  . Omega-3 Fatty Acids (FISH OIL) 1000 MG CPDR Take 1,000 mg by mouth daily.  . polyethylene glycol powder (GLYCOLAX/MIRALAX) powder Take 17 g by mouth  daily.  . Probiotic Product (RISA-BID PROBIOTIC) TABS Take 1 tablet by mouth daily.  . Turmeric 500 MG TABS Take 1 tablet by mouth daily.  . [DISCONTINUED] acetaminophen (TYLENOL) 325 MG tablet Take 650 mg by mouth. Take two tablets twice daily as needed to reduce back pain   . [DISCONTINUED] ciclopirox (PENLAC) 8 % solution Apply topically at bedtime. Apply over nail and surrounding skin. Apply daily over previous coat. After seven (7) days, may remove with alcohol and continue cycle.  . [DISCONTINUED] ELDERBERRY PO Take 335 mg by mouth daily.  . [DISCONTINUED] ipratropium-albuterol (DUONEB) 0.5-2.5 (3) MG/3ML SOLN Take 3 mLs by nebulization every 6 (six) hours as needed.  . [DISCONTINUED] nystatin-triamcinolone (MYCOLOG II) cream Apply 1 application topically 2 (two) times daily.   No facility-administered encounter medications on file as of 03/05/2017.     Review of Systems  Constitutional: Positive for unexpected weight change. Negative for activity change, appetite change, chills and fever.       Gained  12 lbs in a year  HENT: Negative for congestion.   Eyes:       Glasses  Respiratory: Negative for chest tightness and shortness of breath.   Cardiovascular: Negative for chest pain and leg swelling.  Gastrointestinal: Positive for constipation. Negative for abdominal distention, abdominal pain, diarrhea, nausea and vomiting.  Genitourinary: Negative for dysuria.  Musculoskeletal: Positive for back pain and gait problem. Negative for joint swelling and myalgias.  Neurological: Negative for dizziness and weakness.  Psychiatric/Behavioral: Positive for confusion.    Immunization History  Administered Date(s) Administered  . Influenza Inj Mdck Quad Pf 06/21/2016  . Influenza Whole 06/03/2012, 06/17/2013  . Influenza-Unspecified 06/09/2014, 06/16/2015  . PPD Test 09/29/2012  . Pneumococcal Conjugate-13 06/14/2016  . Pneumococcal Polysaccharide-23 09/03/1998  . Td 09/03/1998,  09/25/2016  . Tdap 09/25/2016  . Zoster 09/04/2007   Pertinent  Health Maintenance Due  Topic Date Due  . OPHTHALMOLOGY EXAM  06/30/1937  . URINE MICROALBUMIN  06[birth year]30/1937  . HEMOGLOBIN A1C  12/13/2016  . INFLUENZA VACCINE  04/03/2017  . FOOT EXAM  09/24/2017  . DEXA SCAN  Completed  . PNA vac Low Risk Adult  Completed   Fall Risk  06/19/2016 01/19/2015 12/16/2013  Falls in the past year? Yes Yes No  Number falls in past yr: 1 1 -  Injury with Fall? No No -  Risk for fall due to : Other (Comment) - -  Follow up Education provided - -   Functional Status Survey:    Vitals:   03/05/17 1408  BP: 111/64  Pulse: 69  Resp: 16  Temp: 97.8 F (36.6 C)  TempSrc: Oral  SpO2: 97%  Weight: 154 lb (69.9 kg)   Body mass index is 24.12 kg/m. Physical Exam  Constitutional: She appears well-developed and well-nourished. No distress.  Eyes:  glasses  Cardiovascular: Normal rate, regular rhythm, normal heart sounds and intact distal pulses.   Pulmonary/Chest: Effort normal and breath sounds normal. No respiratory distress.  Abdominal: Soft. Bowel sounds are normal. She exhibits no distension. There is no tenderness.  Genitourinary:  Genitourinary Comments: Persistent erythema of vagina, labia, skin folds  Musculoskeletal: Normal range of motion.  Ambulates unsteadily with rollator walker and assistance from caregiver  Neurological: She is alert.  Skin: Skin is warm and dry. There is pallor.  Psychiatric: She has a normal mood and affect.    Labs reviewed:  Recent Labs  01/11/17 0630 03/04/17 0600  NA 141 140  K 4.3 4.0  BUN 25* 19  CREATININE 1.2* 1.3*    Recent Labs  01/11/17 0630  AST 18  ALT 10  ALKPHOS 39    Recent Labs  01/11/17 0630 03/04/17 0600  WBC 5.3 5.4  HGB 12.5 13.4  HCT 38 41  PLT 142* 155   Lab Results  Component Value Date   TSH 0.70 06/14/2016   Lab Results  Component Value Date   HGBA1C 4.0 06/14/2016   Lab Results  Component  Value Date   CHOL 175 09/16/2014   HDL 43 09/16/2014   LDLCALC 82 09/16/2014   TRIG 249 (A) 09/16/2014   CHOLHDL 3.5 07/09/2012    Assessment/Plan 1. Osteopenia of multiple sites -cont ca with D, additional D and weightbearing exercise walking with rollator with caregiver  2. Vascular dementia without behavioral disturbance -chronic, has caregiver assistance, snf care in memory care, remains on namenda  3. Hypothyroidism, unspecified type -stable on 62.33mcg daily of levothyroxine Lab Results  Component Value Date  TSH 0.70 06/14/2016  --needs annual recheck in october  4. Slow transit constipation -ongoing, cont miralax  5. Pre-ulcerative calluses -cont to follow with podiatry  6. Lichen sclerosus et atrophicus -seems this is worse lately, but hard to distinguish how much of her problem is this vs vaginitis -cont clobetasol, had diflucan x 2 w/o benefit -schedule gyn appt due to lack of improvement (and it might take 6 wks to get specialist appt so don't want to wait longer when she's had this for many weeks or even months already)  7. Chronic vaginitis -see #6, did not improve with tx of yeast or so far with antifungal -gyn referral  Family/ staff Communication: discussed with memory care nursing  Labs/tests ordered:  Gyn referral--arrange now  [place] L. Iszabella Hebenstreit, D.O. [facility] [address], [place] [address] Cell Phone (Mon-Fri 8am-5pm):  [phone] On Call:  [phone] & follow prompts after 5pm & weekends Office Phone:  [phone] Office Fax:  [phone]

## 2017-03-13 ENCOUNTER — Encounter: Payer: Self-pay | Admitting: Obstetrics & Gynecology

## 2017-03-13 ENCOUNTER — Ambulatory Visit (INDEPENDENT_AMBULATORY_CARE_PROVIDER_SITE_OTHER): Payer: Medicare Other | Admitting: Obstetrics & Gynecology

## 2017-03-13 VITALS — BP 128/78

## 2017-03-13 DIAGNOSIS — R21 Rash and other nonspecific skin eruption: Secondary | ICD-10-CM | POA: Diagnosis not present

## 2017-03-13 DIAGNOSIS — N904 Leukoplakia of vulva: Secondary | ICD-10-CM

## 2017-03-13 MED ORDER — CLOBETASOL PROPIONATE 0.05 % EX OINT: 1.0000 "application " | TOPICAL_OINTMENT | Freq: Two times a day (BID) | CUTANEOUS | 4 refills | Status: AC

## 2017-03-13 NOTE — Patient Instructions (Signed)
1. Vulvar rash Probably secondary to contact dermatitis given patient's stool incontinence, combined with Lichen sclerosus.  2. Lichen sclerosus et atrophicus of the vulva Exacerbated severe Lichen Sclerosus with significant Atrophy and fissures.  Recommend keeping the vulva and rectal area dry and clean.  Wear white cotton underwear.  Apply Clobetasol 0.05 % Ointment in a thin layer covering the whole area of affected skin (all the red and blue areas above, all the way to the entrance to the vagina.) twice a day for 4 weeks.  Patient needs nursing care day and night to hope for resolution of this severe rash.  After going to the restroom, recommend cleaning with a wet warm cloth, padding only, and drying gently.  Please f/u in 3 weeks for reassessment.  [name], it was a pleasure to meet you today!  I will see you again in 3 weeks.

## 2017-03-13 NOTE — Progress Notes (Signed)
Lorraine [name] Apr 25, 1927 [id number]   History:    83 y.o.  G1P1 Has a daughter.  S/P Vaginal Hysterectomy  RP:  Vulvar rash x 2 weeks  HPI:  H/O Lichen Sclerosus x 2585.  Severe vulvar rash noticed by care giver 2 wks ago.  Seen by physician 03/01/2017.  Clobetasol ointment prescribed once a day as well as Diflucan.  Per care giver, patient has incontinence of stool.  Incontinence of urine?  Patient not able to voice complaint or has no complaint about discomfort or itching at the vulva.  Past medical history,surgical history, family history and social history were all reviewed and documented in the EPIC chart. Per chart, Premarin cream prescribed.  Is it given to the patient?  Gynecologic History No LMP recorded. Patient has had a hysterectomy.   Obstetric History OB History  No data available     ROS: A ROS was performed and pertinent positives and negatives are included in the history.  GENERAL: No fevers or chills. HEENT: No change in vision, no earache, sore throat or sinus congestion. NECK: No pain or stiffness. CARDIOVASCULAR: No chest pain or pressure. No palpitations. PULMONARY: No shortness of breath, cough or wheeze. GASTROINTESTINAL: No abdominal pain, nausea, vomiting or diarrhea, melena or bright red blood per rectum. GENITOURINARY: No urinary frequency, urgency, hesitancy or dysuria. MUSCULOSKELETAL: No joint or muscle pain, no back pain, no recent trauma. DERMATOLOGIC: No rash, no itching, no lesions. ENDOCRINE: No polyuria, polydipsia, no heat or cold intolerance. No recent change in weight. HEMATOLOGICAL: No anemia or easy bruising or bleeding. NEUROLOGIC: No headache, seizures, numbness, tingling or weakness. PSYCHIATRIC: No depression, no loss of interest in normal activity or change in sleep pattern.     Exam:  BP 128/78   There is no height or weight on file to calculate BMI.  General appearance : Well developed well nourished female. No acute  distress HEENT: Eyes: no retinal hemorrhage or exudates,  Neck supple, trachea midline, no carotid bruits, no thyroidmegaly Lungs: Clear to auscultation, no rhonchi or wheezes, or rib retractions  Heart: Regular rate and rhythm, no murmurs or gallops Breast:Examined in sitting and supine position were symmetrical in appearance, no palpable masses or tenderness,  no skin retraction, no nipple inversion, no nipple discharge, no skin discoloration, no axillary or supraclavicular lymphadenopathy Abdomen: no palpable masses or tenderness, no rebound or guarding Extremities: no edema or skin discoloration or tenderness  Pelvic: Vulva:  Severe erythematous rash covering the whole vulva to the thighs and passed the anal area.  Whitish, atrophic, fissure areas at the vulva toward the Introitus and perineum.  Typical of severe exacerbated Lichen Sclerosis.  Physical Exam  Genitourinary:                  Vagina: No gross lesions or discharge  Cervix/Uterus absent   Assessment/Plan:  83 y.o. female  1. Vulvar rash Probably secondary to contact dermatitis given patient's stool incontinence, combined with Lichen sclerosus.  2. Lichen sclerosus et atrophicus of the vulva Exacerbated severe Lichen Sclerosus with significant Atrophy and fissures.  Recommend keeping the vulva and rectal area dry and clean.  Wear white cotton underwear.  Apply Clobetasol 0.05 % Ointment in a thin layer covering the whole area of affected skin (all the red and blue areas above, all the way to the entrance to the vagina.) twice a day for 4 weeks.  Patient needs nursing care day and night to hope for resolution of this severe  rash.  After going to the restroom, recommend cleaning with a wet warm cloth, padding only, and drying gently.  Please f/u in 3 weeks for reassessment.  Counseling on above issues >50% x 30 minutes.   Princess Bruins MD, 2:15 PM 03/13/2017

## 2017-03-18 ENCOUNTER — Non-Acute Institutional Stay (SKILLED_NURSING_FACILITY): Payer: Medicare Other | Admitting: Adult Health

## 2017-03-18 ENCOUNTER — Encounter: Payer: Self-pay | Admitting: Adult Health

## 2017-03-18 ENCOUNTER — Telehealth: Payer: Self-pay | Admitting: *Deleted

## 2017-03-18 DIAGNOSIS — L900 Lichen sclerosus et atrophicus: Secondary | ICD-10-CM | POA: Diagnosis not present

## 2017-03-18 DIAGNOSIS — S300XXA Contusion of lower back and pelvis, initial encounter: Secondary | ICD-10-CM

## 2017-03-18 NOTE — Progress Notes (Signed)
Location:  [facility] of Service:  SNF (31) Provider:   Cindi Carbon, ANP [place] [phone]   Gayland Curry, DO  Patient Care Team: Gayland Curry, DO as PCP - General (Geriatric Medicine) Neldon Mc, MD as Consulting Physician (General Surgery) Particia Nearing, MD as Consulting Physician (Dermatology) Monna Fam, MD as Consulting Physician (Ophthalmology) Garlan Fair, MD as Consulting Physician (Gastroenterology) Kary Kos, MD as Consulting Physician (Neurosurgery) Elsie Saas, MD as Consulting Physician (Orthopedic Surgery) Selinda Orion, MD (Inactive) as Consulting Physician (Obstetrics and Gynecology) [place] [place], MD as Consulting Physician (Dermatology)  Extended Emergency Contact Information Primary Emergency Contact: Shirley of [place] Phone: [phone] Mobile Phone: [phone] Relation: Daughter Secondary Emergency Contact: Hubert Azure, [place] [address] Johnnette Litter of [place] Phone: [phone] Work Phone: [phone] Mobile Phone: [phone] Relation: Other  Code Status:  DNR Goals of care: Advanced Directive information Advanced Directives 03/05/2017  Does Patient Have a Medical Advance Directive? Yes  Type of Advance Directive Out of facility DNR (pink MOST or yellow form);[place];Living will  Does patient want to make changes to medical advance directive? -  Copy of [place] in Chart? Yes  Pre-existing out of facility DNR order (yellow form or pink MOST form) Yellow form placed in chart (order not valid for inpatient use);Pink MOST form placed in chart (order not valid for inpatient use)     Chief Complaint  Patient presents with  . Acute Visit    bruise to buttock/vaginal area    HPI:  Pt is a 83 y.o. female seen today for an acute visit for an area of  bruising to the left buttock/vaginal area.  This was first noted today. There is no drainage or open area. She has a hx of lichen sclerosus and is currently being followed by GYN and taking clobetasol ointment twice a day for this issue. The staff have been leaving her open to air QID as well since she has periods of incontinence.  She feel on was found sitting on the floor on 03/13/17 per staff.     Past Medical History:  Diagnosis Date  . Abnormality of gait 2008   re: metoclopramide.   . Alopecia areata   . Anal fissure   . Arthritis   . B12 deficiency   . Cataracts, bilateral   . Closed fracture of lateral malleolus   . Depression   . Diabetes mellitus without complication ([place])   . Diffuse cystic mastopathy 1997   s/p right breast bx, benign  . Digestive-genital tract fistula, female   . Diverticulosis of colon (without mention of hemorrhage)   . Dizziness and giddiness    benign positional vertigo  . Dysphagia, pharyngoesophageal phase   . GERD (gastroesophageal reflux disease)   . Hyperlipidemia   . Hypertension   . Insomnia, unspecified   . Lumbago 2001   lumbar radiculopathy L3, spondylosis L2-3, 3-4. dx Dr.Cram  . Mild cognitive impairment with memory loss 12/16/2013  . Other lichen, not elsewhere classified 6222   Lichen sclerosis perineum/perianal  . Other malaise and fatigue   . Other psoriasis   . Other specified diseases of blood and blood-forming organs(289.89)    s/p Chelation treatment  . Skin cancer   . Thyroid disease   . Unspecified constipation   . Ventral hernia, unspecified, without mention of obstruction  or gangrene    Past Surgical History:  Procedure Laterality Date  . APPENDECTOMY/OVARIAN CYSTECTOMY  1950s  . BREAST BIOPSY  1997   right benign  . BREAST LUMPECTOMY  1997  . BUNIONECTOMY Bilateral 1975   twice  . CATARACT EXTRACTION W/ INTRAOCULAR LENS  IMPLANT, BILATERAL  1990  . COLECTOMY  11/2007   Sigmoid w/closure of fistula and repair of  umbilical hernia  . CYSTECTOMY  1950's   brachial cleft  . HERNIA REPAIR    . TONSILLECTOMY AND ADENOIDECTOMY  1942  . VAGINAL HYSTERECTOMY  1998    Allergies  Allergen Reactions  . Codeine     Unknown   . Erythromycin     Unknown     Outpatient Encounter Prescriptions as of 03/18/2017  Medication Sig  . Ascorbic Acid (VITAMIN C) 1000 MG tablet Take 1,000 mg by mouth daily.  . Calcium Carbonate-Vitamin D 600-400 MG-UNIT per chew tablet Chew 1 tablet by mouth 2 (two) times daily.  . Cholecalciferol (VITAMIN D3) 2000 units TABS Take 1 tablet by mouth daily.  . clobetasol ointment (TEMOVATE) 5.62 % Apply 1 application topically 2 (two) times daily.  Marland Kitchen conjugated estrogens (PREMARIN) vaginal cream Place 1 Applicatorful vaginally 3 (three) times a week.  . cyanocobalamin 1000 MCG tablet Take 1,000 mcg by mouth daily.   Marland Kitchen docusate sodium (COLACE) 100 MG capsule Take 100 mg by mouth 2 (two) times daily.  . hydrocortisone cream 1 % Apply 1 application topically 2 (two) times daily. To chest as needed  . levothyroxine (SYNTHROID, LEVOTHROID) 75 MCG tablet Take 62.5 mcg by mouth daily before breakfast.   . memantine (NAMENDA) 10 MG tablet Take 10 mg by mouth 2 (two) times daily.  . multivitamin-iron-minerals-folic acid (CENTRUM) chewable tablet Chew 1 tablet by mouth daily.  . Omega-3 Fatty Acids (FISH OIL) 1000 MG CPDR Take 1,000 mg by mouth daily.  . polyethylene glycol powder (GLYCOLAX/MIRALAX) powder Take 17 g by mouth daily.  . Probiotic Product (RISA-BID PROBIOTIC) TABS Take 1 tablet by mouth daily.  . Turmeric 500 MG TABS Take 1 tablet by mouth daily.   No facility-administered encounter medications on file as of 03/18/2017.     Review of Systems  Constitutional: Negative for activity change, appetite change, chills, diaphoresis, fatigue, fever and unexpected weight change.  HENT: Negative for congestion.   Respiratory: Negative for cough, shortness of breath and wheezing.     Cardiovascular: Negative for chest pain, palpitations and leg swelling.  Gastrointestinal: Negative for abdominal distention, abdominal pain, constipation and diarrhea.       Incontinence  Genitourinary: Negative for difficulty urinating, dysuria, menstrual problem, pelvic pain, vaginal bleeding, vaginal discharge and vaginal pain.  Musculoskeletal: Positive for gait problem. Negative for arthralgias, back pain, joint swelling and myalgias.  Skin: Positive for color change and rash.  Neurological: Negative for dizziness, tremors, seizures, syncope, facial asymmetry, speech difficulty, weakness, light-headedness, numbness and headaches.  Psychiatric/Behavioral: Positive for confusion. Negative for agitation and behavioral problems.    Immunization History  Administered Date(s) Administered  . Influenza Inj Mdck Quad Pf 06/21/2016  . Influenza Whole 06/03/2012, 06/17/2013  . Influenza-Unspecified 06/09/2014, 06/16/2015  . PPD Test 09/29/2012  . Pneumococcal Conjugate-13 06/14/2016  . Pneumococcal Polysaccharide-23 09/03/1998  . Td 09/03/1998, 09/25/2016  . Tdap 09/25/2016  . Zoster 09/04/2007   Pertinent  Health Maintenance Due  Topic Date Due  . OPHTHALMOLOGY EXAM  06/30/1937  . URINE MICROALBUMIN  06/30/1937  . HEMOGLOBIN A1C  12/13/2016  .  INFLUENZA VACCINE  04/03/2017  . FOOT EXAM  09/24/2017  . DEXA SCAN  Completed  . PNA vac Low Risk Adult  Completed   Fall Risk  06/19/2016 01/19/2015 12/16/2013  Falls in the past year? Yes Yes No  Number falls in past yr: 1 1 -  Injury with Fall? No No -  Risk for fall due to : Other (Comment) - -  Follow up Education provided - -   Functional Status Survey:    There were no vitals filed for this visit. There is no height or weight on file to calculate BMI. Physical Exam  Constitutional: No distress.  Abdominal: Soft. Bowel sounds are normal. She exhibits no distension.  Skin: Skin is warm and dry. Rash noted. She is not  diaphoretic. There is erythema.  Erythema noted to labia and buttocks is much improved. Left buttock area with bruising and small lesion that does not appear open (dried up).  No tenderness or spongy texture.    Labs reviewed:  Recent Labs  01/11/17 0630 03/04/17 0600  NA 141 140  K 4.3 4.0  BUN 25* 19  CREATININE 1.2* 1.3*    Recent Labs  01/11/17 0630  AST 18  ALT 10  ALKPHOS 39    Recent Labs  01/11/17 0630 03/04/17 0600  WBC 5.3 5.4  HGB 12.5 13.4  HCT 38 41  PLT 142* 155   Lab Results  Component Value Date   TSH 0.70 06/14/2016   Lab Results  Component Value Date   HGBA1C 4.0 06/14/2016   Lab Results  Component Value Date   CHOL 175 09/16/2014   HDL 43 09/16/2014   LDLCALC 82 09/16/2014   TRIG 249 (A) 09/16/2014   CHOLHDL 3.5 07/09/2012    Significant Diagnostic Results in last 30 days:  No results found.  Assessment/Plan  1. Lichen sclerosus Improved Continue clobetasol ointment BID as indicated by GYN and follow up in 2 weeks  2. Traumatic ecchymosis of buttock, initial encounter Due to fall with no acute injury. Continue to monitor  Fall prec  Family/ staff Communication: discussed with nurse and supervisor  Labs/tests ordered:  NA

## 2017-03-18 NOTE — Telephone Encounter (Signed)
pts daughter called questioning if a barrier cream or ointment is needed between the clobetasol applications twice a day. Please advise. Thank you. KW CMA

## 2017-03-20 NOTE — Telephone Encounter (Signed)
Probably be best to use A and D ointment after each bathroom/ pad change, it looks like she has some incontinence. Sensitive skin baby wipes (buy at grocery store), warm washcloths - patting instead of rubbing can also be helpful. The barrier ointment can be helpful to prevent further excoriation. Avoid Desitin because it is sticky and sometimes can cause further problems.

## 2017-03-20 NOTE — Telephone Encounter (Signed)
Pts daughter Dorian Pod informed. KW CMA

## 2017-03-20 NOTE — Telephone Encounter (Signed)
Lorraine [name] can you look at this patient's chart. The daughter is calling asking if a barrier cream is needed between applications of clebatosol cream. Pls advise. KW CMA

## 2017-04-03 ENCOUNTER — Ambulatory Visit (INDEPENDENT_AMBULATORY_CARE_PROVIDER_SITE_OTHER): Payer: Medicare Other | Admitting: Obstetrics & Gynecology

## 2017-04-03 ENCOUNTER — Encounter: Payer: Self-pay | Admitting: Obstetrics & Gynecology

## 2017-04-03 DIAGNOSIS — N904 Leukoplakia of vulva: Secondary | ICD-10-CM | POA: Diagnosis not present

## 2017-04-03 DIAGNOSIS — N762 Acute vulvitis: Secondary | ICD-10-CM | POA: Diagnosis not present

## 2017-04-03 NOTE — Patient Instructions (Signed)
1. Acute vulvitis Resolved with continuous specialized care including prompt cleaning and application of Zinc cream after incontinence of urine and stools day and night; Estrace cream twice a week.  Clobetasol 0.05% ointment twice a day.  Will need to continue to receive above care to prevent recurrence of severe acute vulvitis, but can decrease Clobetasol applications for long term treatment of Lichen sclerosus as described below.    2. Lichen sclerosus et atrophicus of the vulva Thin application of Clobetasol 0.05% Ointment to affected Vulva and Perianal area twice a week.  Apply to Vulvar skin showing white atrophy, butterfly distribution of inner labia majora to introitus, perineum and perianal area.  Will need long term treatment with Clobetasol twice a week given that Lichen sclerosus is a chronic skin condition.  Chen, it was a pleasure to see you today!

## 2017-04-03 NOTE — Progress Notes (Signed)
    Lorraine [name] August 21, 1927 [id number]        83 y.o.  G1P1 Lives at [facility].  Spoke with Daughter on the phone.  RP:  F/U from Severe Acute Vulvitis and Vulvar Lichen Sclerosus  HPI:  Patient not reporting vulvar itching, pain or discomfort, but poor historian.  Per caregiver accompanying patient, vulva looks much better since last visit with continuous specialized care including prompt cleaning and application of Zinc cream after incontinence of urine and stools day and night; Estrace cream twice a week.  Clobetasol 0.05% ointment twice a day.    Past medical history,surgical history, problem list, medications, allergies, family history and social history were all reviewed and documented in the EPIC chart.  Directed ROS with pertinent positives and negatives documented in the history of present illness/assessment and plan.  Exam:  There were no vitals filed for this visit. General appearance:  Normal  Vulva:  Resolve severe erythema/inflammation.  Intact skin on outer labia majora to thighs.  Improved Lichen sclerosis, but inner labia majora to introitus as well as perineum and perianal areas still showing white atrophy.  Typical Butterfly distribution.  Assessment/Plan:  83 y.o.  1. Acute vulvitis Resolved with continuous specialized care including prompt cleaning and application of Zinc cream after incontinence of urine and stools day and night; Estrace cream twice a week.  Clobetasol 0.05% ointment twice a day.  Will need to continue to receive above care to prevent recurrence of severe acute vulvitis, but can decrease Clobetasol applications for long term treatment of Lichen sclerosus as described below.    2. Lichen sclerosus et atrophicus of the vulva Thin application of Clobetasol 0.05% Ointment to affected Vulva and Perianal area twice a week.  Apply to Vulvar skin showing white atrophy, butterfly distribution of inner labia majora to introitus, perineum and perianal area.   Will need long term treatment with Clobetasol twice a week given that Lichen sclerosus is a chronic skin condition.  Counseling on above issues >50% x 25 minutes.  Counseling also done with daughter, Lorraine [name], on the phone who is her [facility] POA and consultation report filled out for [facility].  Princess Bruins MD, 1:09 PM 04/03/2017

## 2017-04-05 ENCOUNTER — Ambulatory Visit: Payer: Medicare Other | Admitting: Obstetrics & Gynecology

## 2017-04-08 ENCOUNTER — Telehealth: Payer: Self-pay | Admitting: *Deleted

## 2017-04-08 NOTE — Telephone Encounter (Signed)
[facility] called stating pt has directions for Calmoseptine cream to apply to  Vulva and Perianal area, states no directions were on order. Should directions be apply cream twice weekly? Please advise

## 2017-04-09 NOTE — Telephone Encounter (Signed)
Spoke with Misty at [employer] and directions relayed.

## 2017-04-09 NOTE — Telephone Encounter (Signed)
Calmoseptine application on Vulva and Perianal area qd AND as needed after cleaning for incontinence events.

## 2017-04-17 DIAGNOSIS — D649 Anemia, unspecified: Secondary | ICD-10-CM | POA: Diagnosis not present

## 2017-04-17 DIAGNOSIS — R4182 Altered mental status, unspecified: Secondary | ICD-10-CM | POA: Diagnosis not present

## 2017-04-17 LAB — BASIC METABOLIC PANEL
BUN: 22 — AB (ref 4–21)
CREATININE: 1.4 — AB (ref 0.5–1.1)
GLUCOSE: 133
Potassium: 4.4 (ref 3.4–5.3)
Sodium: 138 (ref 137–147)

## 2017-04-17 LAB — CBC AND DIFFERENTIAL
HCT: 37 (ref 36–46)
Hemoglobin: 12.5 (ref 12.0–16.0)
Platelets: 152 (ref 150–399)
WBC: 8.5

## 2017-04-18 ENCOUNTER — Non-Acute Institutional Stay (SKILLED_NURSING_FACILITY): Payer: Medicare Other | Admitting: Adult Health

## 2017-04-18 ENCOUNTER — Encounter: Payer: Self-pay | Admitting: Adult Health

## 2017-04-18 DIAGNOSIS — R159 Full incontinence of feces: Secondary | ICD-10-CM

## 2017-04-18 DIAGNOSIS — R531 Weakness: Secondary | ICD-10-CM

## 2017-04-18 DIAGNOSIS — R4182 Altered mental status, unspecified: Secondary | ICD-10-CM | POA: Diagnosis not present

## 2017-04-18 DIAGNOSIS — E860 Dehydration: Secondary | ICD-10-CM | POA: Diagnosis not present

## 2017-04-18 DIAGNOSIS — S91301A Unspecified open wound, right foot, initial encounter: Secondary | ICD-10-CM | POA: Diagnosis not present

## 2017-04-18 DIAGNOSIS — R509 Fever, unspecified: Secondary | ICD-10-CM | POA: Diagnosis not present

## 2017-04-18 DIAGNOSIS — L900 Lichen sclerosus et atrophicus: Secondary | ICD-10-CM

## 2017-04-18 DIAGNOSIS — N390 Urinary tract infection, site not specified: Secondary | ICD-10-CM | POA: Diagnosis not present

## 2017-04-18 DIAGNOSIS — R634 Abnormal weight loss: Secondary | ICD-10-CM | POA: Diagnosis not present

## 2017-04-18 DIAGNOSIS — Z79899 Other long term (current) drug therapy: Secondary | ICD-10-CM | POA: Diagnosis not present

## 2017-04-18 DIAGNOSIS — N3945 Continuous leakage: Secondary | ICD-10-CM | POA: Diagnosis not present

## 2017-04-18 DIAGNOSIS — R319 Hematuria, unspecified: Secondary | ICD-10-CM | POA: Diagnosis not present

## 2017-04-18 NOTE — Progress Notes (Addendum)
Location:  Occupational psychologist of Service:  SNF (31) Provider:   Cindi Carbon, ANP [place] [phone]   Gayland Curry, DO  Patient Care Team: Gayland Curry, DO as PCP - General (Geriatric Medicine) Neldon Mc, MD as Consulting Physician (General Surgery) Particia Nearing, MD as Consulting Physician (Dermatology) Monna Fam, MD as Consulting Physician (Ophthalmology) Garlan Fair, MD as Consulting Physician (Gastroenterology) Kary Kos, MD as Consulting Physician (Neurosurgery) Elsie Saas, MD as Consulting Physician (Orthopedic Surgery) Selinda Orion, MD (Inactive) as Consulting Physician (Obstetrics and Gynecology) [place], Well Clovis Pu, MD as Consulting Physician (Dermatology)  Extended Emergency Contact Information Primary Emergency Contact: [place] of [place] Phone: [phone] Mobile Phone: [phone] Relation: Daughter Secondary Emergency Contact: Hubert Azure, [place] [address] Johnnette Litter of [place] Phone: [phone] Work Phone: [phone] Mobile Phone: [phone] Relation: Other  Code Status:  DNR Goals of care: Advanced Directive information Advanced Directives 03/05/2017  Does Patient Have a Medical Advance Directive? Yes  Type of Advance Directive Out of facility DNR (pink MOST or yellow form);[place];Living will  Does patient want to make changes to medical advance directive? -  Copy of [place] in Chart? Yes  Pre-existing out of facility DNR order (yellow form or pink MOST form) Yellow form placed in chart (order not valid for inpatient use);Pink MOST form placed in chart (order not valid for inpatient use)     Chief Complaint  Patient presents with  . Acute Visit    weakness, vaginal irritation, poor intake    HPI:  Pt is a 83 y.o. female seen today for an acute visit for  weakness, dehydration, vaginal irritation, poor intake, and wound to her right heel.  IVF of NS at 80 cc/hr was ordered to due to s/s of dehydration and borderline low BP 90/60 which is currently infusing. Staff report she has been eating less and losing weight Wt Readings from Last 3 Encounters:  04/18/17 149 lb (67.6 kg)  03/05/17 154 lb (69.9 kg)  09/24/16 153 lb 9.6 oz (69.7 kg)   Weakness: staff report for the past 1-2 weeks she has been more sleepy, weak, and had intermittent unsteady gait  Right heel: "cut" noted to right heel two weeks ago per staff which appeared to look like an injury not an ulceration. Now the wound has progressed and has enlarged with surrounding redness  Lichen sclerosus: worse per staff with more redness and inflammation. Currently on clobetasol twice weekly previous took  It nightly for 6 weeks with significant improvement.  She is incontinent of urine and the erythema of the vagina area improved significantly when she was left open to air in her room QID  Staff report she has constant stool seepage despite being off miralax.     Past Medical History:  Diagnosis Date  . Abnormality of gait 2008   re: metoclopramide.   . Alopecia areata   . Anal fissure   . Arthritis   . B12 deficiency   . Cataracts, bilateral   . Closed fracture of lateral malleolus   . Depression   . Diabetes mellitus without complication ([place])   . Diffuse cystic mastopathy 1997   s/p right breast bx, benign  . Digestive-genital tract fistula, female   . Diverticulosis of colon (without mention of hemorrhage)   . Dizziness and giddiness    benign positional  vertigo  . Dysphagia, pharyngoesophageal phase   . GERD (gastroesophageal reflux disease)   . Hyperlipidemia   . Hypertension   . Insomnia, unspecified   . Lumbago 2001   lumbar radiculopathy L3, spondylosis L2-3, 3-4. dx Dr.Cram  . Mild cognitive impairment with memory loss 12/16/2013  . Other lichen, not elsewhere  classified 4315   Lichen sclerosis perineum/perianal  . Other malaise and fatigue   . Other psoriasis   . Other specified diseases of blood and blood-forming organs(289.89)    s/p Chelation treatment  . Skin cancer   . Thyroid disease   . Unspecified constipation   . Ventral hernia, unspecified, without mention of obstruction or gangrene    Past Surgical History:  Procedure Laterality Date  . APPENDECTOMY/OVARIAN CYSTECTOMY  1950s  . BREAST BIOPSY  1997   right benign  . BREAST LUMPECTOMY  1997  . BUNIONECTOMY Bilateral 1975   twice  . CATARACT EXTRACTION W/ INTRAOCULAR LENS  IMPLANT, BILATERAL  1990  . COLECTOMY  11/2007   Sigmoid w/closure of fistula and repair of umbilical hernia  . CYSTECTOMY  1950's   brachial cleft  . HERNIA REPAIR    . TONSILLECTOMY AND ADENOIDECTOMY  1942  . VAGINAL HYSTERECTOMY  1998    Allergies  Allergen Reactions  . Codeine     Unknown   . Erythromycin     Unknown     Outpatient Encounter Prescriptions as of 04/18/2017  Medication Sig  . Ascorbic Acid (VITAMIN C) 1000 MG tablet Take 1,000 mg by mouth daily.  . Calcium Carbonate-Vitamin D 600-400 MG-UNIT per chew tablet Chew 1 tablet by mouth 2 (two) times daily.  . Cholecalciferol (VITAMIN D3) 2000 units TABS Take 1 tablet by mouth daily.  . clobetasol ointment (TEMOVATE) 4.00 % Apply 1 application topically 2 (two) times a week.  . conjugated estrogens (PREMARIN) vaginal cream Place 1 Applicatorful vaginally 3 (three) times a week.  . cyanocobalamin 1000 MCG tablet Take 1,000 mcg by mouth daily.   Marland Kitchen docusate sodium (COLACE) 100 MG capsule Take 100 mg by mouth 2 (two) times daily.  Marland Kitchen levothyroxine (SYNTHROID, LEVOTHROID) 75 MCG tablet Take 62.5 mcg by mouth daily before breakfast.   . Melatonin 5 MG CAPS Take 5 mg by mouth.  . memantine (NAMENDA) 10 MG tablet Take 10 mg by mouth 2 (two) times daily.  . Menthol-Zinc Oxide (CALMOSEPTINE) 0.44-20.6 % OINT Apply 1 application topically daily as  needed.  . multivitamin-iron-minerals-folic acid (CENTRUM) chewable tablet Chew 1 tablet by mouth daily.  . Omega-3 Fatty Acids (FISH OIL) 1000 MG CPDR Take 1,000 mg by mouth daily.  . Probiotic Product (RISA-BID PROBIOTIC) TABS Take 1 tablet by mouth daily.  . Turmeric 500 MG TABS Take 1 tablet by mouth daily.  . Zinc Oxide (BOUDREAUXS BUTT PASTE EX) Apply 1 application topically daily.  . [DISCONTINUED] hydrocortisone cream 1 % Apply 1 application topically 2 (two) times a week. To chest as needed   . [DISCONTINUED] polyethylene glycol powder (GLYCOLAX/MIRALAX) powder Take 17 g by mouth daily.   No facility-administered encounter medications on file as of 04/18/2017.     Review of Systems  Constitutional: Positive for activity change, appetite change and unexpected weight change. Negative for chills, diaphoresis, fatigue and fever.  HENT: Negative for congestion.   Respiratory: Negative for cough, shortness of breath and wheezing.   Cardiovascular: Negative for chest pain, palpitations and leg swelling.  Gastrointestinal: Negative for abdominal distention, abdominal pain, constipation and diarrhea.  Genitourinary:  Positive for vaginal discharge. Negative for difficulty urinating, dysuria, flank pain, frequency, hematuria, pelvic pain, vaginal bleeding and vaginal pain.  Musculoskeletal: Positive for gait problem. Negative for arthralgias, back pain, joint swelling and myalgias.  Skin: Positive for rash (vaginal/rectal) and wound (right heel).  Neurological: Negative for dizziness, tremors, seizures, syncope, facial asymmetry, speech difficulty, weakness, light-headedness, numbness and headaches.  Psychiatric/Behavioral: Positive for confusion. Negative for agitation and behavioral problems.    Immunization History  Administered Date(s) Administered  . Influenza Inj Mdck Quad Pf 06/21/2016  . Influenza Whole 06/03/2012, 06/17/2013  . Influenza-Unspecified 06/09/2014, 06/16/2015  . PPD  Test 09/29/2012  . Pneumococcal Conjugate-13 06/14/2016  . Pneumococcal Polysaccharide-23 09/03/1998  . Td 09/03/1998, 09/25/2016  . Tdap 09/25/2016  . Zoster 09/04/2007   Pertinent  Health Maintenance Due  Topic Date Due  . OPHTHALMOLOGY EXAM  06/30/1937  . URINE MICROALBUMIN  06/30/1937  . HEMOGLOBIN A1C  12/13/2016  . INFLUENZA VACCINE  04/03/2017  . FOOT EXAM  09/24/2017  . DEXA SCAN  Completed  . PNA vac Low Risk Adult  Completed   Fall Risk  06/19/2016 01/19/2015 12/16/2013  Falls in the past year? Yes Yes No  Number falls in past yr: 1 1 -  Injury with Fall? No No -  Risk for fall due to : Other (Comment) - -  Follow up Education provided - -   Functional Status Survey:    Vitals:   04/18/17 1458  BP: 110/64  Pulse: 85  Resp: 16  Temp: (!) 97.4 F (36.3 C)  SpO2: 96%  Weight: 149 lb (67.6 kg)   Body mass index is 23.34 kg/m. Physical Exam  Constitutional: No distress.  HENT:  Head: Normocephalic and atraumatic.  Right Ear: External ear normal.  Left Ear: External ear normal.  Nose: Nose normal.  Mouth/Throat: Oropharynx is clear and moist.  Eyes: Pupils are equal, round, and reactive to light. Conjunctivae are normal. Right eye exhibits no discharge. Left eye exhibits no discharge.  Neck: No JVD present. No thyromegaly present.  Cardiovascular: Normal rate and regular rhythm.   No murmur heard. Pulmonary/Chest: Effort normal and breath sounds normal. No respiratory distress. She has no wheezes.  Abdominal: Soft. Bowel sounds are normal. She exhibits no distension. There is tenderness (s/p area).  Genitourinary:  Genitourinary Comments: Vaginal area and buttocks with erythema, purulent drainage from meatus.  White patches noted to labial folds  Musculoskeletal: She exhibits no edema or tenderness.  Lymphadenopathy:    She has no cervical adenopathy.  Neurological: She is alert.  Oriented to self only, able to f/c  Skin: Skin is warm and dry. She is not  diaphoretic.  Right heel: open area with 100% red tissue with surrounding redness, no drianage  Psychiatric: She has a normal mood and affect.    Labs reviewed:  Recent Labs  01/11/17 0630 03/04/17 0600 04/17/17  NA 141 140 138  K 4.3 4.0 4.4  BUN 25* 19 22*  CREATININE 1.2* 1.3* 1.4*    Recent Labs  01/11/17 0630  AST 18  ALT 10  ALKPHOS 39    Recent Labs  01/11/17 0630 03/04/17 0600 04/17/17  WBC 5.3 5.4 8.5  HGB 12.5 13.4 12.5  HCT 38 41 37  PLT 142* 155 152   Lab Results  Component Value Date   TSH 0.70 06/14/2016   Lab Results  Component Value Date   HGBA1C 4.0 06/14/2016   Lab Results  Component Value Date   CHOL 175  09/16/2014   HDL 43 09/16/2014   LDLCALC 82 09/16/2014   TRIG 249 (A) 09/16/2014   CHOLHDL 3.5 07/09/2012    Significant Diagnostic Results in last 30 days:  No results found.  Assessment/Plan  1. Dehydration Complete 1L of NS and obtain BMP Encourage oral fluid   2. Generalized weakness Due to dehydration and infection of wound Also ?UTI due to purulent urine, dehydration, weakness UA C and S I and O cath CXR 2 view to rule out pna  3. Weight loss Due to poor intake Boost Qd Monitor weight  4. Open wound of right heel, initial encounter Due to injury? (unclear etiology) Keflex 500 mg TID for 7 days Hydrogel with dressing change daily Needs ABIs Air mattress  5. Lichen sclerosus Worsening, due to incontinence and inflammation F/U with GYN Continue Cloebetasol ointment twice weekly, will increase to nightly again if no improvement with incontinence prevention measures Dilfucan 150 mg x 1 dose  6. Continuous leakage of urine Open to air QID for 1 hr Keep clean and dry Turn while in bed  7. Full incontinence of feces Change colace to 100 mg BID prn to prevent constant contact with skin   Family/ staff Communication: Discussed with her daughter, Ms. Keenan Bachelor  Labs/tests ordered:  ABIs UA CXR BMP

## 2017-04-19 DIAGNOSIS — E860 Dehydration: Secondary | ICD-10-CM | POA: Diagnosis not present

## 2017-04-19 DIAGNOSIS — D649 Anemia, unspecified: Secondary | ICD-10-CM | POA: Diagnosis not present

## 2017-04-19 LAB — BASIC METABOLIC PANEL
BUN: 21 (ref 4–21)
Creatinine: 1.2 — AB (ref 0.5–1.1)
Glucose: 158
POTASSIUM: 3.9 (ref 3.4–5.3)
Sodium: 137 (ref 137–147)

## 2017-04-29 DIAGNOSIS — A0472 Enterocolitis due to Clostridium difficile, not specified as recurrent: Secondary | ICD-10-CM | POA: Diagnosis not present

## 2017-04-29 DIAGNOSIS — R197 Diarrhea, unspecified: Secondary | ICD-10-CM | POA: Diagnosis not present

## 2017-05-01 DIAGNOSIS — R197 Diarrhea, unspecified: Secondary | ICD-10-CM | POA: Diagnosis not present

## 2017-05-01 DIAGNOSIS — A0472 Enterocolitis due to Clostridium difficile, not specified as recurrent: Secondary | ICD-10-CM | POA: Diagnosis not present

## 2017-05-03 ENCOUNTER — Non-Acute Institutional Stay (SKILLED_NURSING_FACILITY): Payer: Medicare Other | Admitting: Adult Health

## 2017-05-03 ENCOUNTER — Encounter: Payer: Self-pay | Admitting: Adult Health

## 2017-05-03 DIAGNOSIS — L280 Lichen simplex chronicus: Secondary | ICD-10-CM | POA: Diagnosis not present

## 2017-05-03 DIAGNOSIS — K432 Incisional hernia without obstruction or gangrene: Secondary | ICD-10-CM | POA: Diagnosis not present

## 2017-05-03 DIAGNOSIS — R079 Chest pain, unspecified: Secondary | ICD-10-CM | POA: Diagnosis not present

## 2017-05-03 DIAGNOSIS — D649 Anemia, unspecified: Secondary | ICD-10-CM | POA: Diagnosis not present

## 2017-05-03 DIAGNOSIS — R509 Fever, unspecified: Secondary | ICD-10-CM | POA: Diagnosis not present

## 2017-05-03 DIAGNOSIS — R319 Hematuria, unspecified: Secondary | ICD-10-CM | POA: Diagnosis not present

## 2017-05-03 DIAGNOSIS — R159 Full incontinence of feces: Secondary | ICD-10-CM

## 2017-05-03 DIAGNOSIS — R109 Unspecified abdominal pain: Secondary | ICD-10-CM | POA: Diagnosis not present

## 2017-05-03 LAB — HEPATIC FUNCTION PANEL
ALT: 19 (ref 7–35)
AST: 21 (ref 13–35)
Alkaline Phosphatase: 48 (ref 25–125)
Bilirubin, Total: 0.5

## 2017-05-03 LAB — CBC AND DIFFERENTIAL
HCT: 33 — AB (ref 36–46)
Hemoglobin: 11.5 — AB (ref 12.0–16.0)
Platelets: 161 (ref 150–399)
WBC: 10.8

## 2017-05-03 LAB — BASIC METABOLIC PANEL
BUN: 21 (ref 4–21)
Creatinine: 1.4 — AB (ref 0.5–1.1)
Glucose: 100
Potassium: 4.2 (ref 3.4–5.3)
Sodium: 137 (ref 137–147)

## 2017-05-03 NOTE — Progress Notes (Signed)
Location:  Occupational psychologist of Service:  SNF (31) Provider:   Cindi Carbon, ANP [place] [phone]   Gayland Curry, DO  Patient Care Team: Gayland Curry, DO as PCP - General (Geriatric Medicine) Neldon Mc, MD as Consulting Physician (General Surgery) Particia Nearing, MD as Consulting Physician (Dermatology) Monna Fam, MD as Consulting Physician (Ophthalmology) Garlan Fair, MD as Consulting Physician (Gastroenterology) Kary Kos, MD as Consulting Physician (Neurosurgery) Elsie Saas, MD as Consulting Physician (Orthopedic Surgery) Selinda Orion, MD (Inactive) as Consulting Physician (Obstetrics and Gynecology) [place], Well Clovis Pu, MD as Consulting Physician (Dermatology)  Extended Emergency Contact Information Primary Emergency Contact: [place] of [place] Phone: [phone] Mobile Phone: [phone] Relation: Daughter Secondary Emergency Contact: Hubert Azure, [place] [address] Johnnette Litter of [place] Phone: [phone] Work Phone: [phone] Mobile Phone: [phone] Relation: Other  Code Status:  DNR Goals of care: Advanced Directive information Advanced Directives 03/05/2017  Does Patient Have a Medical Advance Directive? Yes  Type of Advance Directive Out of facility DNR (pink MOST or yellow form);[place];Living will  Does patient want to make changes to medical advance directive? -  Copy of [place] in Chart? Yes  Pre-existing out of facility DNR order (yellow form or pink MOST form) Yellow form placed in chart (order not valid for inpatient use);Pink MOST form placed in chart (order not valid for inpatient use)     Chief Complaint  Patient presents with  . Acute Visit    fever    HPI:  Pt is a 83 y.o. female seen today in the memory care unit for an acute visit for temp of  101.7 (rectal) first noted this morning. Staff report that she was alert with her baseline mental status but that she was weaker than usual and required more assistance with ambulation. Of note Lorraine [name] was treated for UTI 100,000 colonies with Rocephin for 5 days on 04/21/17.  The staff sent two Cdiff specimens which were neg  8/27 and 8/30.  These were done for "diarrhea" which turned out to be pudding consistency stools which is her baseline as well. She has chronic fecal incontinence with leakage.  We have taken her off laxatives and it has not helped.  The staff deny that she has any additional symptoms such as cough, congestion, dysuria, etc. They report she has normal intake.  Ms. Zertuche has dementia but can verbalize and has no complaints.   Past Medical History:  Diagnosis Date  . Abnormality of gait 2008   re: metoclopramide.   . Alopecia areata   . Anal fissure   . Arthritis   . B12 deficiency   . Cataracts, bilateral   . Closed fracture of lateral malleolus   . Depression   . Diabetes mellitus without complication ([place])   . Diffuse cystic mastopathy 1997   s/p right breast bx, benign  . Digestive-genital tract fistula, female   . Diverticulosis of colon (without mention of hemorrhage)   . Dizziness and giddiness    benign positional vertigo  . Dysphagia, pharyngoesophageal phase   . GERD (gastroesophageal reflux disease)   . Hyperlipidemia   . Hypertension   . Insomnia, unspecified   . Lumbago 2001   lumbar radiculopathy L3, spondylosis L2-3, 3-4. dx Dr.Cram  . Mild cognitive impairment with memory loss 12/16/2013  . Other lichen, not elsewhere classified  5809   Lichen sclerosis perineum/perianal  . Other malaise and fatigue   . Other psoriasis   . Other specified diseases of blood and blood-forming organs(289.89)    s/p Chelation treatment  . Skin cancer   . Thyroid disease   . Unspecified constipation   . Ventral hernia, unspecified, without mention of obstruction or  gangrene    Past Surgical History:  Procedure Laterality Date  . APPENDECTOMY/OVARIAN CYSTECTOMY  1950s  . BREAST BIOPSY  1997   right benign  . BREAST LUMPECTOMY  1997  . BUNIONECTOMY Bilateral 1975   twice  . CATARACT EXTRACTION W/ INTRAOCULAR LENS  IMPLANT, BILATERAL  1990  . COLECTOMY  11/2007   Sigmoid w/closure of fistula and repair of umbilical hernia  . CYSTECTOMY  1950's   brachial cleft  . HERNIA REPAIR    . TONSILLECTOMY AND ADENOIDECTOMY  1942  . VAGINAL HYSTERECTOMY  1998    Allergies  Allergen Reactions  . Codeine     Unknown   . Erythromycin     Unknown     Outpatient Encounter Prescriptions as of 05/03/2017  Medication Sig  . Ascorbic Acid (VITAMIN C) 1000 MG tablet Take 1,000 mg by mouth daily.  . Calcium Carbonate-Vitamin D 600-400 MG-UNIT per chew tablet Chew 1 tablet by mouth 2 (two) times daily.  . Cholecalciferol (VITAMIN D3) 2000 units TABS Take 1 tablet by mouth daily.  . clobetasol ointment (TEMOVATE) 9.83 % Apply 1 application topically 2 (two) times a week.  . conjugated estrogens (PREMARIN) vaginal cream Place 1 Applicatorful vaginally 3 (three) times a week.  . cyanocobalamin 1000 MCG tablet Take 1,000 mcg by mouth daily.   Marland Kitchen docusate sodium (COLACE) 100 MG capsule Take 100 mg by mouth 2 (two) times daily.  Marland Kitchen levothyroxine (SYNTHROID, LEVOTHROID) 75 MCG tablet Take 62.5 mcg by mouth daily before breakfast.   . Melatonin 5 MG CAPS Take 5 mg by mouth.  . memantine (NAMENDA) 10 MG tablet Take 10 mg by mouth 2 (two) times daily.  . Menthol-Zinc Oxide (CALMOSEPTINE) 0.44-20.6 % OINT Apply 1 application topically daily as needed.  . multivitamin-iron-minerals-folic acid (CENTRUM) chewable tablet Chew 1 tablet by mouth daily.  . Omega-3 Fatty Acids (FISH OIL) 1000 MG CPDR Take 1,000 mg by mouth daily.  . Probiotic Product (RISA-BID PROBIOTIC) TABS Take 1 tablet by mouth daily.  . Turmeric 500 MG TABS Take 1 tablet by mouth daily.  . Zinc Oxide  (BOUDREAUXS BUTT PASTE EX) Apply 1 application topically daily.   No facility-administered encounter medications on file as of 05/03/2017.     Review of Systems  Constitutional: Positive for fatigue and fever. Negative for activity change, appetite change, chills, diaphoresis and unexpected weight change.  HENT: Negative for congestion, rhinorrhea, sinus pain and trouble swallowing.   Respiratory: Negative for cough, shortness of breath and wheezing.   Cardiovascular: Negative for chest pain, palpitations and leg swelling.  Gastrointestinal: Negative for abdominal distention, abdominal pain, constipation and diarrhea.       Fecal incontinence and chronic stool leakage  Genitourinary: Negative for difficulty urinating, dysuria and hematuria.  Musculoskeletal: Positive for gait problem (uses walker). Negative for arthralgias, back pain, joint swelling and myalgias.  Neurological: Negative for dizziness, tremors, seizures, syncope, facial asymmetry, speech difficulty, weakness, light-headedness, numbness and headaches.  Psychiatric/Behavioral: Positive for confusion. Negative for agitation and behavioral problems.    Immunization History  Administered Date(s) Administered  . Influenza Inj Mdck Quad Pf 06/21/2016  . Influenza Whole 06/03/2012,  06/17/2013  . Influenza-Unspecified 06/09/2014, 06/16/2015  . PPD Test 09/29/2012  . Pneumococcal Conjugate-13 06/14/2016  . Pneumococcal Polysaccharide-23 09/03/1998  . Td 09/03/1998, 09/25/2016  . Tdap 09/25/2016  . Zoster 09/04/2007   Pertinent  Health Maintenance Due  Topic Date Due  . OPHTHALMOLOGY EXAM  06/30/1937  . URINE MICROALBUMIN  06/30/1937  . HEMOGLOBIN A1C  12/13/2016  . INFLUENZA VACCINE  04/03/2017  . FOOT EXAM  09/24/2017  . DEXA SCAN  Completed  . PNA vac Low Risk Adult  Completed   Fall Risk  06/19/2016 01/19/2015 12/16/2013  Falls in the past year? Yes Yes No  Number falls in past yr: 1 1 -  Injury with Fall? No No -    Risk for fall due to : Other (Comment) - -  Follow up Education provided - -   Functional Status Survey:    Vitals:   05/03/17 1015  BP: (!) 99/54  Pulse: 98  Resp: 16  Temp: (!) 101.7 F (38.7 C)  SpO2: 95%   There is no height or weight on file to calculate BMI. Physical Exam  Constitutional: No distress.  HENT:  Head: Normocephalic and atraumatic.  Nose: Nose normal.  Mouth/Throat: Oropharynx is clear and moist. No oropharyngeal exudate.  Eyes: Pupils are equal, round, and reactive to light. Conjunctivae are normal. Right eye exhibits no discharge. Left eye exhibits no discharge.  Neck: No JVD present.  Cardiovascular: Normal rate and regular rhythm.   No murmur heard. Pulmonary/Chest: Effort normal and breath sounds normal. No respiratory distress. She has no wheezes.  Abdominal: Soft. Bowel sounds are normal. She exhibits no distension. There is no tenderness.  Incisional hernia noted with  No tenderness, swelling, or redness  Musculoskeletal: She exhibits no edema or tenderness.  Lymphadenopathy:    She has no cervical adenopathy.  Neurological: She is alert. No cranial nerve deficit.  Skin: Skin is warm and dry. She is not diaphoretic.  Mild erythema to vaginal are, significant improvement  Psychiatric: She has a normal mood and affect.  Vitals reviewed.   Labs reviewed:  Recent Labs  01/11/17 0630 03/04/17 0600 04/17/17  NA 141 140 138  K 4.3 4.0 4.4  BUN 25* 19 22*  CREATININE 1.2* 1.3* 1.4*    Recent Labs  01/11/17 0630  AST 18  ALT 10  ALKPHOS 39    Recent Labs  01/11/17 0630 03/04/17 0600 04/17/17  WBC 5.3 5.4 8.5  HGB 12.5 13.4 12.5  HCT 38 41 37  PLT 142* 155 152   Lab Results  Component Value Date   TSH 0.70 06/14/2016   Lab Results  Component Value Date   HGBA1C 4.0 06/14/2016   Lab Results  Component Value Date   CHOL 175 09/16/2014   HDL 43 09/16/2014   LDLCALC 82 09/16/2014   TRIG 249 (A) 09/16/2014   CHOLHDL 3.5  07/09/2012    Significant Diagnostic Results in last 30 days:  No results found.  Assessment/Plan  1. Fever of unknown origin Temp of 101.7 after finishing Rocephin with unclear etiology Check CXR and UA C and S to rule out infection Check labs to evaluate for dehydration.  I have asked the staff to monitor her oral intake and report if it is inadequate. Also check KUB due to hx of incisional hernia to rule out obstruction (has pudding consistency stools)  2. Full incontinence of feces Chronic issue I have asked the staff to hold her vitamins since her intake is poor  and also to see if this helps with the consistency of her stools. She had one stool that was watery after my visit, if this continues will get another cdiff and treat accordingly. If stools continue to remain pudding consistency and leak out causing skin issues will consider bulk forming agent.   3. Incisional hernia, without obstruction or gangrene No signs of incarceration or obstruction but will check KUB for certainty  4. Lichen Of the vaginal area Much improved with Clobetasol ointment per GYN    Family/ staff Communication: discussed with Andee Poles the nurse who later called Ms. Petit's daughter  Labs/tests ordered:  CBC CMP CXR KUB UA C and S

## 2017-05-20 ENCOUNTER — Non-Acute Institutional Stay (SKILLED_NURSING_FACILITY): Payer: Medicare Other | Admitting: Adult Health

## 2017-05-20 ENCOUNTER — Encounter: Payer: Self-pay | Admitting: Adult Health

## 2017-05-20 DIAGNOSIS — J181 Lobar pneumonia, unspecified organism: Secondary | ICD-10-CM

## 2017-05-20 DIAGNOSIS — E038 Other specified hypothyroidism: Secondary | ICD-10-CM

## 2017-05-20 DIAGNOSIS — J189 Pneumonia, unspecified organism: Secondary | ICD-10-CM | POA: Diagnosis not present

## 2017-05-20 DIAGNOSIS — K5901 Slow transit constipation: Secondary | ICD-10-CM | POA: Diagnosis not present

## 2017-05-20 DIAGNOSIS — R1310 Dysphagia, unspecified: Secondary | ICD-10-CM

## 2017-05-20 DIAGNOSIS — R531 Weakness: Secondary | ICD-10-CM

## 2017-05-20 NOTE — Progress Notes (Signed)
Location:  [facility] of Service:  SNF (31) Provider:   Cindi Carbon, ANP [place] [phone]   Gayland Curry, DO  Patient Care Team: Gayland Curry, DO as PCP - General (Geriatric Medicine) Neldon Mc, MD as Consulting Physician (General Surgery) Particia Nearing, MD as Consulting Physician (Dermatology) Monna Fam, MD as Consulting Physician (Ophthalmology) Garlan Fair, MD as Consulting Physician (Gastroenterology) Kary Kos, MD as Consulting Physician (Neurosurgery) Elsie Saas, MD as Consulting Physician (Orthopedic Surgery) Selinda Orion, MD (Inactive) as Consulting Physician (Obstetrics and Gynecology) [place], Well Clovis Pu, MD as Consulting Physician (Dermatology)  Extended Emergency Contact Information Primary Emergency Contact: [place] of [place] Phone: [phone] Mobile Phone: [phone] Relation: Daughter Secondary Emergency Contact: Hubert Azure, [place] [address] Johnnette Litter of [place] Phone: [phone] Work Phone: [phone] Mobile Phone: [phone] Relation: Other  Code Status:  DNR Goals of care: Advanced Directive information Advanced Directives 05/20/2017  Does Patient Have a Medical Advance Directive? Yes  Type of Paramedic of [place];Living will;Out of facility DNR (pink MOST or yellow form)  Does patient want to make changes to medical advance directive? -  Copy of [place] in Chart? Yes  Pre-existing out of facility DNR order (yellow form or pink MOST form) Pink MOST form placed in chart (order not valid for inpatient use);Yellow form placed in chart (order not valid for inpatient use)     Chief Complaint  Patient presents with  . Acute Visit    low grade temp, needs f/u CXR  . Medical Management of Chronic Issues    HPI:  Pt is a 83 y.o. female seen  today for medical management of chronic diseases.  She has advanced vascular dementia with a hx of TIA and resides in the memory care unit. She as treated for a RLL pna on 8/31 with 7 days of Avelox. Staff deny any reports of cough but she does have nasal drainage and a low grade temp of 99.5 this am.  Staff have reported pill dysphagia in the past and some of her larger pills were discontinued for this reason as well as issues with continuous stool leakage.  She has become progressively weaker and is now working with physical therapy.    Hypothyroidism: currently on 75 mcg of synthroid Lab Results  Component Value Date   TSH 0.70 06/14/2016   Continuous stool leakage: had pudding consistency stools which continuously leaked and led to skin excoriation in August. Laxatives were stopped. Now she has not had a BM in 7 days. The nurse reports that she will not strain or make attempts to evacuate stool.  Past Medical History:  Diagnosis Date  . Abnormality of gait 2008   re: metoclopramide.   . Alopecia areata   . Anal fissure   . Arthritis   . B12 deficiency   . Cataracts, bilateral   . Closed fracture of lateral malleolus   . Depression   . Diabetes mellitus without complication ([place])   . Diffuse cystic mastopathy 1997   s/p right breast bx, benign  . Digestive-genital tract fistula, female   . Diverticulosis of colon (without mention of hemorrhage)   . Dizziness and giddiness    benign positional vertigo  . Dysphagia, pharyngoesophageal phase   . GERD (gastroesophageal reflux disease)   . Hyperlipidemia   . Hypertension   . Insomnia,  unspecified   . Lumbago 2001   lumbar radiculopathy L3, spondylosis L2-3, 3-4. dx Dr.Cram  . Mild cognitive impairment with memory loss 12/16/2013  . Other lichen, not elsewhere classified 6256   Lichen sclerosis perineum/perianal  . Other malaise and fatigue   . Other psoriasis   . Other specified diseases of blood and blood-forming organs(289.89)     s/p Chelation treatment  . Skin cancer   . Thyroid disease   . Unspecified constipation   . Ventral hernia, unspecified, without mention of obstruction or gangrene    Past Surgical History:  Procedure Laterality Date  . APPENDECTOMY/OVARIAN CYSTECTOMY  1950s  . BREAST BIOPSY  1997   right benign  . BREAST LUMPECTOMY  1997  . BUNIONECTOMY Bilateral 1975   twice  . CATARACT EXTRACTION W/ INTRAOCULAR LENS  IMPLANT, BILATERAL  1990  . COLECTOMY  11/2007   Sigmoid w/closure of fistula and repair of umbilical hernia  . CYSTECTOMY  1950's   brachial cleft  . HERNIA REPAIR    . TONSILLECTOMY AND ADENOIDECTOMY  1942  . VAGINAL HYSTERECTOMY  1998    Allergies  Allergen Reactions  . Codeine     Unknown   . Erythromycin     Unknown     Outpatient Encounter Prescriptions as of 05/20/2017  Medication Sig  . Calcium Carbonate-Vitamin D 600-400 MG-UNIT per chew tablet Chew 1 tablet by mouth 2 (two) times daily.  . Cholecalciferol (VITAMIN D3) 2000 units TABS Take 1 tablet by mouth daily.  . clobetasol ointment (TEMOVATE) 3.89 % Apply 1 application topically 2 (two) times a week.  . conjugated estrogens (PREMARIN) vaginal cream Place 1 Applicatorful vaginally 3 (three) times a week.  . cyanocobalamin 1000 MCG tablet Take 1,000 mcg by mouth daily.   Marland Kitchen levothyroxine (SYNTHROID, LEVOTHROID) 75 MCG tablet Take 62.5 mcg by mouth daily before breakfast.   . Melatonin 5 MG CAPS Take 5 mg by mouth.  . memantine (NAMENDA) 10 MG tablet Take 10 mg by mouth 2 (two) times daily.  . Menthol-Zinc Oxide (CALMOSEPTINE) 0.44-20.6 % OINT Apply 1 application topically daily as needed.  . Nutritional Supplements (NUTRITIONAL SUPPLEMENT PLUS PO) Take 1 scoop by mouth daily.  . Probiotic Product (RISA-BID PROBIOTIC) TABS Take 1 tablet by mouth daily.  . Turmeric 500 MG TABS Take 1 tablet by mouth daily.  . Zinc Oxide (BOUDREAUXS BUTT PASTE EX) Apply 1 application topically daily.  . [DISCONTINUED] Ascorbic  Acid (VITAMIN C) 1000 MG tablet Take 1,000 mg by mouth daily.  . [DISCONTINUED] docusate sodium (COLACE) 100 MG capsule Take 100 mg by mouth 2 (two) times daily as needed.   . [DISCONTINUED] multivitamin-iron-minerals-folic acid (CENTRUM) chewable tablet Chew 1 tablet by mouth daily.  . [DISCONTINUED] Omega-3 Fatty Acids (FISH OIL) 1000 MG CPDR Take 1,000 mg by mouth daily.   No facility-administered encounter medications on file as of 05/20/2017.     Review of Systems  Constitutional: Positive for activity change and fever. Negative for appetite change, chills, diaphoresis, fatigue and unexpected weight change.  HENT: Positive for congestion, postnasal drip, rhinorrhea and trouble swallowing (large pills). Negative for sinus pressure and sore throat.   Respiratory: Negative for cough, shortness of breath and wheezing.   Cardiovascular: Negative for chest pain, palpitations and leg swelling.  Gastrointestinal: Positive for constipation. Negative for abdominal distention, abdominal pain and diarrhea.  Genitourinary: Negative for difficulty urinating and dysuria.  Musculoskeletal: Positive for gait problem. Negative for arthralgias, back pain, joint swelling and myalgias.  Neurological: Positive for weakness (general). Negative for dizziness, tremors, seizures, syncope, facial asymmetry, speech difficulty, light-headedness, numbness and headaches.  Psychiatric/Behavioral: Positive for confusion. Negative for agitation and behavioral problems.    Immunization History  Administered Date(s) Administered  . Influenza Inj Mdck Quad Pf 06/21/2016  . Influenza Whole 06/03/2012, 06/17/2013  . Influenza-Unspecified 06/09/2014, 06/16/2015  . PPD Test 09/29/2012  . Pneumococcal Conjugate-13 06/14/2016  . Pneumococcal Polysaccharide-23 09/03/1998  . Td 09/03/1998, 09/25/2016  . Tdap 09/25/2016  . Zoster 09/04/2007   Pertinent  Health Maintenance Due  Topic Date Due  . OPHTHALMOLOGY EXAM   06/30/1937  . URINE MICROALBUMIN  06/30/1937  . HEMOGLOBIN A1C  12/13/2016  . INFLUENZA VACCINE  04/03/2017  . FOOT EXAM  09/24/2017  . DEXA SCAN  Completed  . PNA vac Low Risk Adult  Completed   Fall Risk  06/19/2016 01/19/2015 12/16/2013  Falls in the past year? Yes Yes No  Number falls in past yr: 1 1 -  Injury with Fall? No No -  Risk for fall due to : Other (Comment) - -  Follow up Education provided - -   Functional Status Survey:    Vitals:   05/20/17 0918  BP: (!) 90/51  Pulse: 98  Resp: 20  Temp: 99.5 F (37.5 C)  SpO2: 95%  Weight: 149 lb 9.6 oz (67.9 kg)   Body mass index is 23.43 kg/m.  Wt Readings from Last 3 Encounters:  05/20/17 149 lb 9.6 oz (67.9 kg)  04/18/17 149 lb (67.6 kg)  03/05/17 154 lb (69.9 kg)    Physical Exam  Constitutional: No distress.  HENT:  Head: Normocephalic and atraumatic.  Right Ear: External ear normal.  Left Ear: External ear normal.  Mouth/Throat: Oropharynx is clear and moist. No oropharyngeal exudate.  Clear and white drainage to both nares  Neck: No JVD present. No tracheal deviation present. No thyromegaly present.  Cardiovascular: Normal rate and regular rhythm.   No murmur heard. Pulmonary/Chest: Effort normal and breath sounds normal. No respiratory distress. She has no wheezes.  Abdominal: Soft. Bowel sounds are normal. She exhibits no distension. There is no tenderness.  Incisional hernia noted not reducible not tender   Musculoskeletal: She exhibits no edema or tenderness.  Generally weak  Lymphadenopathy:    She has no cervical adenopathy.  Neurological: She is alert. No cranial nerve deficit.  Oriented to self only, pleasant, able to f/c  Skin: Skin is warm and dry. She is not diaphoretic.  Psychiatric: She has a normal mood and affect.    Labs reviewed:  Recent Labs  03/04/17 0600 04/17/17 04/19/17  NA 140 138 137  K 4.0 4.4 3.9  BUN 19 22* 21  CREATININE 1.3* 1.4* 1.2*    Recent Labs   01/11/17 0630  AST 18  ALT 10  ALKPHOS 39    Recent Labs  01/11/17 0630 03/04/17 0600 04/17/17  WBC 5.3 5.4 8.5  HGB 12.5 13.4 12.5  HCT 38 41 37  PLT 142* 155 152   Lab Results  Component Value Date   TSH 0.70 06/14/2016   Lab Results  Component Value Date   HGBA1C 4.0 06/14/2016   Lab Results  Component Value Date   CHOL 175 09/16/2014   HDL 43 09/16/2014   LDLCALC 82 09/16/2014   TRIG 249 (A) 09/16/2014   CHOLHDL 3.5 07/09/2012    Significant Diagnostic Results in last 30 days:  No results found.  Assessment/Plan   1. Lobar pneumonia ([place])  Needs f/u CXR to evaluate for clearance of pna, especially given low grade temp/nasal drainage   2. Pill dysphagia Larger pills have been removed ST to eval and treat for dysphagia  3. Slow transit constipation Staff to implement protocol which would be a dulcolax supp Will hold off on scheduled laxative due to previous stool leakage with excoriation   4. Other specified hypothyroidism Continue synthroid at 75 mcg qd and check TSH next month  5. Weakness Ms. Redmon has had an overall functional decline in the past few months which is multifactorial. She has advancing dementia and possible dysphagia. She is being treated for lichen sclerosus which has been bothersome to her, as well as a UTI and pneumonia last month. She will be working with PT to gain strength but we have noted an overall decline. Her daughter has completed a most form indicating no hospitalizations and she is already a DNR.  Family/ staff Communication: discussed with nurse  Labs/tests ordered:  F/U CXR

## 2017-05-21 ENCOUNTER — Encounter: Payer: Self-pay | Admitting: Internal Medicine

## 2017-05-21 ENCOUNTER — Non-Acute Institutional Stay (SKILLED_NURSING_FACILITY): Payer: Medicare Other | Admitting: Internal Medicine

## 2017-05-21 DIAGNOSIS — R54 Age-related physical debility: Secondary | ICD-10-CM | POA: Diagnosis not present

## 2017-05-21 DIAGNOSIS — R509 Fever, unspecified: Secondary | ICD-10-CM | POA: Diagnosis not present

## 2017-05-21 DIAGNOSIS — L84 Corns and callosities: Secondary | ICD-10-CM

## 2017-05-21 DIAGNOSIS — F0150 Vascular dementia without behavioral disturbance: Secondary | ICD-10-CM | POA: Diagnosis not present

## 2017-05-21 DIAGNOSIS — R531 Weakness: Secondary | ICD-10-CM

## 2017-05-21 DIAGNOSIS — R1310 Dysphagia, unspecified: Secondary | ICD-10-CM

## 2017-05-21 DIAGNOSIS — L900 Lichen sclerosus et atrophicus: Secondary | ICD-10-CM

## 2017-05-21 DIAGNOSIS — I9589 Other hypotension: Secondary | ICD-10-CM

## 2017-05-21 NOTE — Progress Notes (Signed)
Patient ID: Lorraine [name], female   DOB: 1927-07-10, 83 y.o.   MRN: [id number]  Location:  [facility] Room Number: [place] of Service:  SNF [id number]) Provider:  Gayland Curry, DO  Patient Care Team: Gayland Curry, DO as PCP - General (Geriatric Medicine) Neldon Mc, MD as Consulting Physician (General Surgery) Particia Nearing, MD as Consulting Physician (Dermatology) Monna Fam, MD as Consulting Physician (Ophthalmology) Garlan Fair, MD as Consulting Physician (Gastroenterology) Kary Kos, MD as Consulting Physician (Neurosurgery) Elsie Saas, MD as Consulting Physician (Orthopedic Surgery) Selinda Orion, MD (Inactive) as Consulting Physician (Obstetrics and Gynecology) [place], Well Clovis Pu, MD as Consulting Physician (Dermatology)  Extended Emergency Contact Information Primary Emergency Contact: [place] of [place] Phone: [phone] Mobile Phone: [phone] Relation: Daughter Secondary Emergency Contact: Hubert Azure, [place] [address] Johnnette Litter of [place] Phone: [phone] Work Phone: [phone] Mobile Phone: [phone] Relation: Other  Code Status:  DNR Goals of care: Advanced Directive information Advanced Directives 05/21/2017  Does Patient Have a Medical Advance Directive? Yes  Type of Advance Directive Out of facility DNR (pink MOST or yellow form);[place];Living will  Does patient want to make changes to medical advance directive? -  Copy of [place] in Chart? Yes  Pre-existing out of facility DNR order (yellow form or pink MOST form) Yellow form placed in chart (order not valid for inpatient use);Pink MOST form placed in chart (order not valid for inpatient use)   Chief Complaint  Patient presents with  . Acute Visit    recurrent pneumonia, declining status    HPI:  Pt is a  83 y.o. female seen today for an acute visit for declining status.  Lorraine [name] has a h/o Alzheimer's disease, progressive weakness for about two weeks, aspiration of her pills, fecal and urinary incontinence, increased need for assistance with meals.    Past Medical History:  Diagnosis Date  . Abnormality of gait 2008   re: metoclopramide.   . Alopecia areata   . Anal fissure   . Arthritis   . B12 deficiency   . Cataracts, bilateral   . Closed fracture of lateral malleolus   . Depression   . Diabetes mellitus without complication ([place])   . Diffuse cystic mastopathy 1997   s/p right breast bx, benign  . Digestive-genital tract fistula, female   . Diverticulosis of colon (without mention of hemorrhage)   . Dizziness and giddiness    benign positional vertigo  . Dysphagia, pharyngoesophageal phase   . GERD (gastroesophageal reflux disease)   . Hyperlipidemia   . Hypertension   . Insomnia, unspecified   . Lumbago 2001   lumbar radiculopathy L3, spondylosis L2-3, 3-4. dx Dr.Cram  . Mild cognitive impairment with memory loss 12/16/2013  . Other lichen, not elsewhere classified 6979   Lichen sclerosis perineum/perianal  . Other malaise and fatigue   . Other psoriasis   . Other specified diseases of blood and blood-forming organs(289.89)    s/p Chelation treatment  . Skin cancer   . Thyroid disease   . Unspecified constipation   . Ventral hernia, unspecified, without mention of obstruction or gangrene    Past Surgical History:  Procedure Laterality Date  . APPENDECTOMY/OVARIAN CYSTECTOMY  1950s  . BREAST BIOPSY  1997   right benign  . BREAST LUMPECTOMY  1997  . BUNIONECTOMY Bilateral 1975  twice  . CATARACT EXTRACTION W/ INTRAOCULAR LENS  IMPLANT, BILATERAL  1990  . COLECTOMY  11/2007   Sigmoid w/closure of fistula and repair of umbilical hernia  . CYSTECTOMY  1950's   brachial cleft  . HERNIA REPAIR    . TONSILLECTOMY AND ADENOIDECTOMY  1942  . VAGINAL HYSTERECTOMY  1998      Allergies  Allergen Reactions  . Codeine     Unknown   . Erythromycin     Unknown     Outpatient Encounter Prescriptions as of 05/21/2017  Medication Sig  . acetaminophen (TYLENOL) 325 MG tablet Take 650 mg by mouth every 4 (four) hours as needed.  Marland Kitchen acetaminophen (TYLENOL) 650 MG suppository Place 650 mg rectally every 4 (four) hours as needed.  . Calcium Carbonate-Vitamin D 600-400 MG-UNIT per chew tablet Chew 1 tablet by mouth 2 (two) times daily.  . Cholecalciferol (VITAMIN D3) 2000 units TABS Take 1 tablet by mouth daily.  . clobetasol ointment (TEMOVATE) 4.65 % Apply 1 application topically 2 (two) times a week.  . conjugated estrogens (PREMARIN) vaginal cream Place 1 Applicatorful vaginally 3 (three) times a week.  . cyanocobalamin 1000 MCG tablet Take 1,000 mcg by mouth daily.   Marland Kitchen docusate sodium (COLACE) 100 MG capsule Take 100 mg by mouth 2 (two) times daily as needed for mild constipation.  Marland Kitchen levothyroxine (SYNTHROID, LEVOTHROID) 75 MCG tablet Take 62.5 mcg by mouth daily before breakfast.   . Melatonin 5 MG CAPS Take 5 mg by mouth.  . memantine (NAMENDA) 10 MG tablet Take 10 mg by mouth 2 (two) times daily.  . Menthol-Zinc Oxide (CALMOSEPTINE) 0.44-20.6 % OINT Apply 1 application topically daily as needed.  . Nutritional Supplements (NUTRITIONAL SUPPLEMENT PLUS PO) Take 1 scoop by mouth daily.  . Probiotic Product (RISA-BID PROBIOTIC) TABS Take 1 tablet by mouth daily.  . Turmeric 500 MG TABS Take 1 tablet by mouth daily.  . Zinc Oxide (BOUDREAUXS BUTT PASTE EX) Apply 1 application topically daily.   No facility-administered encounter medications on file as of 05/21/2017.     Review of Systems  Reason unable to perform ROS: this is per nursing and caregiver.  Constitutional: Positive for activity change, appetite change, fatigue and fever. Negative for chills.       Sleeping more  HENT: Positive for congestion and trouble swallowing.   Eyes:       Glasses   Respiratory: Negative for chest tightness, shortness of breath and wheezing.   Cardiovascular: Negative for chest pain, palpitations and leg swelling.  Gastrointestinal: Negative for abdominal pain.       Pudding consistency stools chronic with fecal incontinence  Genitourinary: Negative for dysuria.       Urinary incontinence  Musculoskeletal: Positive for gait problem.       More difficulty walking, recent fall, needing assistance when walking with walker and lots of cues  Skin: Positive for pallor.  Neurological: Positive for weakness. Negative for dizziness.  Hematological: Bruises/bleeds easily.  Psychiatric/Behavioral: Positive for confusion. Negative for sleep disturbance.    Immunization History  Administered Date(s) Administered  . Influenza Inj Mdck Quad Pf 06/21/2016  . Influenza Whole 06/03/2012, 06/17/2013  . Influenza-Unspecified 06/09/2014, 06/16/2015  . PPD Test 09/29/2012  . Pneumococcal Conjugate-13 06/14/2016  . Pneumococcal Polysaccharide-23 09/03/1998  . Td 09/03/1998, 09/25/2016  . Tdap 09/25/2016  . Zoster 09/04/2007   Pertinent  Health Maintenance Due  Topic Date Due  . OPHTHALMOLOGY EXAM  06/30/1937  . URINE MICROALBUMIN  06/30/1937  .  HEMOGLOBIN A1C  12/13/2016  . INFLUENZA VACCINE  04/03/2017  . FOOT EXAM  09/24/2017  . DEXA SCAN  Completed  . PNA vac Low Risk Adult  Completed   Fall Risk  06/19/2016 01/19/2015 12/16/2013  Falls in the past year? Yes Yes No  Number falls in past yr: 1 1 -  Injury with Fall? No No -  Risk for fall due to : Other (Comment) - -  Follow up Education provided - -   Functional Status Survey:    Vitals:   05/21/17 0952  BP: (!) 100/55  Pulse: 86  Resp: 20  Temp: (!) 101.3 F (38.5 C)  TempSrc: Oral  SpO2: 92%  Weight: 149 lb (67.6 kg)   Body mass index is 23.34 kg/m. Physical Exam  Constitutional: No distress.  Resting in recliner chair, sleepy  HENT:  Head: Normocephalic and atraumatic.  Eyes:  Pupils are equal, round, and reactive to light. Conjunctivae and EOM are normal.  glasses  Neck: Neck supple. No JVD present.  Cardiovascular: Normal rate, regular rhythm, normal heart sounds and intact distal pulses.   Pulmonary/Chest: Effort normal and breath sounds normal. No respiratory distress.  Abdominal: Soft. She exhibits distension.  Hyperactive bS  Musculoskeletal: Normal range of motion.  Lymphadenopathy:    She has no cervical adenopathy.  Neurological:  Easily awakened and answers questions, but denies any concerns whatosever  Skin: Skin is warm and dry. There is pallor.  Dime-sized callus on right heel, not open or draining, nontender  Psychiatric: She has a normal mood and affect.    Labs reviewed:  Recent Labs  04/17/17 04/19/17 05/03/17 1119  NA 138 137 137  K 4.4 3.9 4.2  BUN 22* 21 21  CREATININE 1.4* 1.2* 1.4*    Recent Labs  01/11/17 0630 05/03/17 1119  AST 18 21  ALT 10 19  ALKPHOS 39 48    Recent Labs  03/04/17 0600 04/17/17 05/03/17 1119  WBC 5.4 8.5 10.8  HGB 13.4 12.5 11.5*  HCT 41 37 33*  PLT 155 152 161   Lab Results  Component Value Date   TSH 0.70 06/14/2016   Lab Results  Component Value Date   HGBA1C 4.0 06/14/2016   Lab Results  Component Value Date   CHOL 175 09/16/2014   HDL 43 09/16/2014   LDLCALC 82 09/16/2014   TRIG 249 (A) 09/16/2014   CHOLHDL 3.5 07/09/2012    Assessment/Plan 1. Fever, unspecified fever cause -suspect due to recurrent aspiration episodes -this last one did not show up with any infiltrate on CXR (would stop rechecking these) -just got treated with IVFs mid august for hypotension and dehydration with UA and CXR done--wound up on keflex for heel wound and then rocephin for 5 days for UTI, then had fever again, difficulty walking and found to have RLL pneumonia (aspiration it seems) treated with avelox, then this latest episode of fever, weakness, nasal drainage and cough for which CXR ordered and  ST eval -spoke with pt's daughter, Dorian Pod, who has been talking with the health care director -Dorian Pod realized this seems to be a recurrent problem for her mother and does not want her to suffer -we discussed that often patients with dementia do not tolerate IVs well, often pulling them out due to not understanding what's being done to them--she agreed to withhold any further IVFs or IV abx -we agreed to revise the MOST form -Dorian Pod also requested a hospice referral (see below)  2. Other specified hypotension -  due to poor intake past few days, declining status, does not appear septic  3. Dysphagia, unspecified type -we decided to still involve ST (therapy director requested to wait and see if hospice admits patient or not so we agreed to hold off on ST eval until they evaluate b/c Dorian Pod does not want her mother on a modified diet that she does not enjoy (I had encouraged the ST for other strategies to help with swallowing and possibly a downgrade that did not go all the way to a puree if possible)  4. Heel callus -ongoing but improved per caregiver, had been an open wound, cont offloading pressure from heel   5. Weakness -due to progression of dementia and recurrent aspiration, pt losing ability to ambulate and feed herself recently -advised d/c PT   6. Frailty syndrome in geriatric patient -notably has many criteria for frailty, cont SNF level support for her   7. Vascular dementia without behavioral disturbance -progressing, mmse has been trending down with last I see of 14/30 in May last year but suspect much worse now  8. Lichen sclerosus et atrophicus -improved, cont treatments as recommended by gyn and barrier cream after incontinence episodes and appropriate cleaning  Hospice referral at her daughter's request--has been weaker, having recurrent aspiration, declining skin condition, worsening incontinence and ambulation with increased need for assistance with meals--discussed that she  may not quite qualify for hospice care, but assessment and hospice opinion seems appropriate--certainly a palliative care candidate Revised MOST form to read no IVFs or IV abx Cont with DNR, comfort measures, no tube feeding and possible po abx  Family/ staff Communication: Advance care planning conversation held for 25 minutes with her daughter Dorian Pod [facility]) by phone (she is in [place]), MOST updated and to be emailed for signature, then original mailed for official signature after fax received back   Labs/tests ordered:  No new  Elam Ellis L. Tryton Bodi, D.O. [facility] [address], [place] [address] Cell Phone (Mon-Fri 8am-5pm):  [phone] On Call:  [phone] & follow prompts after 5pm & weekends Office Phone:  [phone] Office Fax:  [phone]

## 2017-05-23 DIAGNOSIS — K219 Gastro-esophageal reflux disease without esophagitis: Secondary | ICD-10-CM | POA: Diagnosis not present

## 2017-05-23 DIAGNOSIS — I672 Cerebral atherosclerosis: Secondary | ICD-10-CM | POA: Diagnosis not present

## 2017-05-23 DIAGNOSIS — I10 Essential (primary) hypertension: Secondary | ICD-10-CM | POA: Diagnosis not present

## 2017-05-23 DIAGNOSIS — E039 Hypothyroidism, unspecified: Secondary | ICD-10-CM | POA: Diagnosis not present

## 2017-05-23 DIAGNOSIS — L900 Lichen sclerosus et atrophicus: Secondary | ICD-10-CM | POA: Diagnosis not present

## 2017-05-23 DIAGNOSIS — J309 Allergic rhinitis, unspecified: Secondary | ICD-10-CM | POA: Diagnosis not present

## 2017-05-23 DIAGNOSIS — Z8673 Personal history of transient ischemic attack (TIA), and cerebral infarction without residual deficits: Secondary | ICD-10-CM | POA: Diagnosis not present

## 2017-05-23 DIAGNOSIS — R1310 Dysphagia, unspecified: Secondary | ICD-10-CM | POA: Diagnosis not present

## 2017-05-23 DIAGNOSIS — E1159 Type 2 diabetes mellitus with other circulatory complications: Secondary | ICD-10-CM | POA: Diagnosis not present

## 2017-05-23 DIAGNOSIS — E785 Hyperlipidemia, unspecified: Secondary | ICD-10-CM | POA: Diagnosis not present

## 2017-05-23 DIAGNOSIS — F0150 Vascular dementia without behavioral disturbance: Secondary | ICD-10-CM | POA: Diagnosis not present

## 2017-05-23 DIAGNOSIS — N189 Chronic kidney disease, unspecified: Secondary | ICD-10-CM | POA: Diagnosis not present

## 2017-05-23 DIAGNOSIS — J690 Pneumonitis due to inhalation of food and vomit: Secondary | ICD-10-CM | POA: Diagnosis not present

## 2017-05-23 DIAGNOSIS — F339 Major depressive disorder, recurrent, unspecified: Secondary | ICD-10-CM | POA: Diagnosis not present

## 2017-05-24 DIAGNOSIS — N189 Chronic kidney disease, unspecified: Secondary | ICD-10-CM | POA: Diagnosis not present

## 2017-05-24 DIAGNOSIS — F0150 Vascular dementia without behavioral disturbance: Secondary | ICD-10-CM | POA: Diagnosis not present

## 2017-05-24 DIAGNOSIS — I672 Cerebral atherosclerosis: Secondary | ICD-10-CM | POA: Diagnosis not present

## 2017-05-24 DIAGNOSIS — Z8673 Personal history of transient ischemic attack (TIA), and cerebral infarction without residual deficits: Secondary | ICD-10-CM | POA: Diagnosis not present

## 2017-05-24 DIAGNOSIS — I10 Essential (primary) hypertension: Secondary | ICD-10-CM | POA: Diagnosis not present

## 2017-05-24 DIAGNOSIS — E1159 Type 2 diabetes mellitus with other circulatory complications: Secondary | ICD-10-CM | POA: Diagnosis not present

## 2017-05-26 DIAGNOSIS — I672 Cerebral atherosclerosis: Secondary | ICD-10-CM | POA: Diagnosis not present

## 2017-05-26 DIAGNOSIS — F0150 Vascular dementia without behavioral disturbance: Secondary | ICD-10-CM | POA: Diagnosis not present

## 2017-05-26 DIAGNOSIS — N189 Chronic kidney disease, unspecified: Secondary | ICD-10-CM | POA: Diagnosis not present

## 2017-05-26 DIAGNOSIS — Z8673 Personal history of transient ischemic attack (TIA), and cerebral infarction without residual deficits: Secondary | ICD-10-CM | POA: Diagnosis not present

## 2017-05-26 DIAGNOSIS — I10 Essential (primary) hypertension: Secondary | ICD-10-CM | POA: Diagnosis not present

## 2017-05-26 DIAGNOSIS — E1159 Type 2 diabetes mellitus with other circulatory complications: Secondary | ICD-10-CM | POA: Diagnosis not present

## 2017-05-27 DIAGNOSIS — Z8673 Personal history of transient ischemic attack (TIA), and cerebral infarction without residual deficits: Secondary | ICD-10-CM | POA: Diagnosis not present

## 2017-05-27 DIAGNOSIS — I672 Cerebral atherosclerosis: Secondary | ICD-10-CM | POA: Diagnosis not present

## 2017-05-27 DIAGNOSIS — E1159 Type 2 diabetes mellitus with other circulatory complications: Secondary | ICD-10-CM | POA: Diagnosis not present

## 2017-05-27 DIAGNOSIS — I10 Essential (primary) hypertension: Secondary | ICD-10-CM | POA: Diagnosis not present

## 2017-05-27 DIAGNOSIS — F0150 Vascular dementia without behavioral disturbance: Secondary | ICD-10-CM | POA: Diagnosis not present

## 2017-05-27 DIAGNOSIS — N189 Chronic kidney disease, unspecified: Secondary | ICD-10-CM | POA: Diagnosis not present

## 2017-05-31 DIAGNOSIS — I10 Essential (primary) hypertension: Secondary | ICD-10-CM | POA: Diagnosis not present

## 2017-05-31 DIAGNOSIS — E1159 Type 2 diabetes mellitus with other circulatory complications: Secondary | ICD-10-CM | POA: Diagnosis not present

## 2017-05-31 DIAGNOSIS — F0150 Vascular dementia without behavioral disturbance: Secondary | ICD-10-CM | POA: Diagnosis not present

## 2017-05-31 DIAGNOSIS — Z8673 Personal history of transient ischemic attack (TIA), and cerebral infarction without residual deficits: Secondary | ICD-10-CM | POA: Diagnosis not present

## 2017-05-31 DIAGNOSIS — N189 Chronic kidney disease, unspecified: Secondary | ICD-10-CM | POA: Diagnosis not present

## 2017-05-31 DIAGNOSIS — I672 Cerebral atherosclerosis: Secondary | ICD-10-CM | POA: Diagnosis not present

## 2017-06-03 DIAGNOSIS — I10 Essential (primary) hypertension: Secondary | ICD-10-CM | POA: Diagnosis not present

## 2017-06-03 DIAGNOSIS — L900 Lichen sclerosus et atrophicus: Secondary | ICD-10-CM | POA: Diagnosis not present

## 2017-06-03 DIAGNOSIS — J309 Allergic rhinitis, unspecified: Secondary | ICD-10-CM | POA: Diagnosis not present

## 2017-06-03 DIAGNOSIS — E039 Hypothyroidism, unspecified: Secondary | ICD-10-CM | POA: Diagnosis not present

## 2017-06-03 DIAGNOSIS — E785 Hyperlipidemia, unspecified: Secondary | ICD-10-CM | POA: Diagnosis not present

## 2017-06-03 DIAGNOSIS — E1159 Type 2 diabetes mellitus with other circulatory complications: Secondary | ICD-10-CM | POA: Diagnosis not present

## 2017-06-03 DIAGNOSIS — F0150 Vascular dementia without behavioral disturbance: Secondary | ICD-10-CM | POA: Diagnosis not present

## 2017-06-03 DIAGNOSIS — N189 Chronic kidney disease, unspecified: Secondary | ICD-10-CM | POA: Diagnosis not present

## 2017-06-03 DIAGNOSIS — F339 Major depressive disorder, recurrent, unspecified: Secondary | ICD-10-CM | POA: Diagnosis not present

## 2017-06-03 DIAGNOSIS — J690 Pneumonitis due to inhalation of food and vomit: Secondary | ICD-10-CM | POA: Diagnosis not present

## 2017-06-03 DIAGNOSIS — Z8673 Personal history of transient ischemic attack (TIA), and cerebral infarction without residual deficits: Secondary | ICD-10-CM | POA: Diagnosis not present

## 2017-06-03 DIAGNOSIS — I672 Cerebral atherosclerosis: Secondary | ICD-10-CM | POA: Diagnosis not present

## 2017-06-03 DIAGNOSIS — K219 Gastro-esophageal reflux disease without esophagitis: Secondary | ICD-10-CM | POA: Diagnosis not present

## 2017-06-03 DIAGNOSIS — R1310 Dysphagia, unspecified: Secondary | ICD-10-CM | POA: Diagnosis not present

## 2017-06-04 DIAGNOSIS — I10 Essential (primary) hypertension: Secondary | ICD-10-CM | POA: Diagnosis not present

## 2017-06-04 DIAGNOSIS — F0150 Vascular dementia without behavioral disturbance: Secondary | ICD-10-CM | POA: Diagnosis not present

## 2017-06-04 DIAGNOSIS — Z8673 Personal history of transient ischemic attack (TIA), and cerebral infarction without residual deficits: Secondary | ICD-10-CM | POA: Diagnosis not present

## 2017-06-04 DIAGNOSIS — E1159 Type 2 diabetes mellitus with other circulatory complications: Secondary | ICD-10-CM | POA: Diagnosis not present

## 2017-06-04 DIAGNOSIS — I672 Cerebral atherosclerosis: Secondary | ICD-10-CM | POA: Diagnosis not present

## 2017-06-04 DIAGNOSIS — N189 Chronic kidney disease, unspecified: Secondary | ICD-10-CM | POA: Diagnosis not present

## 2017-06-11 DIAGNOSIS — I10 Essential (primary) hypertension: Secondary | ICD-10-CM | POA: Diagnosis not present

## 2017-06-11 DIAGNOSIS — I672 Cerebral atherosclerosis: Secondary | ICD-10-CM | POA: Diagnosis not present

## 2017-06-11 DIAGNOSIS — Z8673 Personal history of transient ischemic attack (TIA), and cerebral infarction without residual deficits: Secondary | ICD-10-CM | POA: Diagnosis not present

## 2017-06-11 DIAGNOSIS — F0150 Vascular dementia without behavioral disturbance: Secondary | ICD-10-CM | POA: Diagnosis not present

## 2017-06-11 DIAGNOSIS — N189 Chronic kidney disease, unspecified: Secondary | ICD-10-CM | POA: Diagnosis not present

## 2017-06-11 DIAGNOSIS — E1159 Type 2 diabetes mellitus with other circulatory complications: Secondary | ICD-10-CM | POA: Diagnosis not present

## 2017-06-12 DIAGNOSIS — E1159 Type 2 diabetes mellitus with other circulatory complications: Secondary | ICD-10-CM | POA: Diagnosis not present

## 2017-06-12 DIAGNOSIS — F0150 Vascular dementia without behavioral disturbance: Secondary | ICD-10-CM | POA: Diagnosis not present

## 2017-06-12 DIAGNOSIS — I10 Essential (primary) hypertension: Secondary | ICD-10-CM | POA: Diagnosis not present

## 2017-06-12 DIAGNOSIS — N189 Chronic kidney disease, unspecified: Secondary | ICD-10-CM | POA: Diagnosis not present

## 2017-06-12 DIAGNOSIS — I672 Cerebral atherosclerosis: Secondary | ICD-10-CM | POA: Diagnosis not present

## 2017-06-12 DIAGNOSIS — Z8673 Personal history of transient ischemic attack (TIA), and cerebral infarction without residual deficits: Secondary | ICD-10-CM | POA: Diagnosis not present

## 2017-06-17 DIAGNOSIS — Z8673 Personal history of transient ischemic attack (TIA), and cerebral infarction without residual deficits: Secondary | ICD-10-CM | POA: Diagnosis not present

## 2017-06-17 DIAGNOSIS — I10 Essential (primary) hypertension: Secondary | ICD-10-CM | POA: Diagnosis not present

## 2017-06-17 DIAGNOSIS — I672 Cerebral atherosclerosis: Secondary | ICD-10-CM | POA: Diagnosis not present

## 2017-06-17 DIAGNOSIS — E1159 Type 2 diabetes mellitus with other circulatory complications: Secondary | ICD-10-CM | POA: Diagnosis not present

## 2017-06-17 DIAGNOSIS — F0150 Vascular dementia without behavioral disturbance: Secondary | ICD-10-CM | POA: Diagnosis not present

## 2017-06-17 DIAGNOSIS — N189 Chronic kidney disease, unspecified: Secondary | ICD-10-CM | POA: Diagnosis not present

## 2017-06-18 DIAGNOSIS — E1159 Type 2 diabetes mellitus with other circulatory complications: Secondary | ICD-10-CM | POA: Diagnosis not present

## 2017-06-18 DIAGNOSIS — N189 Chronic kidney disease, unspecified: Secondary | ICD-10-CM | POA: Diagnosis not present

## 2017-06-18 DIAGNOSIS — Z8673 Personal history of transient ischemic attack (TIA), and cerebral infarction without residual deficits: Secondary | ICD-10-CM | POA: Diagnosis not present

## 2017-06-18 DIAGNOSIS — I10 Essential (primary) hypertension: Secondary | ICD-10-CM | POA: Diagnosis not present

## 2017-06-18 DIAGNOSIS — I672 Cerebral atherosclerosis: Secondary | ICD-10-CM | POA: Diagnosis not present

## 2017-06-18 DIAGNOSIS — F0150 Vascular dementia without behavioral disturbance: Secondary | ICD-10-CM | POA: Diagnosis not present

## 2017-06-21 DIAGNOSIS — Z8673 Personal history of transient ischemic attack (TIA), and cerebral infarction without residual deficits: Secondary | ICD-10-CM | POA: Diagnosis not present

## 2017-06-21 DIAGNOSIS — E1159 Type 2 diabetes mellitus with other circulatory complications: Secondary | ICD-10-CM | POA: Diagnosis not present

## 2017-06-21 DIAGNOSIS — I10 Essential (primary) hypertension: Secondary | ICD-10-CM | POA: Diagnosis not present

## 2017-06-21 DIAGNOSIS — F0150 Vascular dementia without behavioral disturbance: Secondary | ICD-10-CM | POA: Diagnosis not present

## 2017-06-21 DIAGNOSIS — N189 Chronic kidney disease, unspecified: Secondary | ICD-10-CM | POA: Diagnosis not present

## 2017-06-21 DIAGNOSIS — I672 Cerebral atherosclerosis: Secondary | ICD-10-CM | POA: Diagnosis not present

## 2017-06-24 ENCOUNTER — Non-Acute Institutional Stay (SKILLED_NURSING_FACILITY): Payer: Medicare Other | Admitting: Adult Health

## 2017-06-24 DIAGNOSIS — R1310 Dysphagia, unspecified: Secondary | ICD-10-CM

## 2017-06-24 DIAGNOSIS — K5901 Slow transit constipation: Secondary | ICD-10-CM

## 2017-06-24 DIAGNOSIS — R634 Abnormal weight loss: Secondary | ICD-10-CM

## 2017-06-24 DIAGNOSIS — F0150 Vascular dementia without behavioral disturbance: Secondary | ICD-10-CM | POA: Diagnosis not present

## 2017-06-24 DIAGNOSIS — R269 Unspecified abnormalities of gait and mobility: Secondary | ICD-10-CM

## 2017-06-24 DIAGNOSIS — L900 Lichen sclerosus et atrophicus: Secondary | ICD-10-CM

## 2017-06-24 NOTE — Progress Notes (Signed)
Location:  [facility] psychologist of Service:  SNF (31) Provider:   Cindi Carbon, ANP [place] [phone]   Gayland Curry, DO  Patient Care Team: Gayland Curry, DO as PCP - General (Geriatric Medicine) Neldon Mc, MD as Consulting Physician (General Surgery) Particia Nearing, MD as Consulting Physician (Dermatology) Monna Fam, MD as Consulting Physician (Ophthalmology) Garlan Fair, MD as Consulting Physician (Gastroenterology) Kary Kos, MD as Consulting Physician (Neurosurgery) Elsie Saas, MD as Consulting Physician (Orthopedic Surgery) Selinda Orion, MD (Inactive) as Consulting Physician (Obstetrics and Gynecology) [place], [place] Pu, MD as Consulting Physician (Dermatology)  Extended Emergency Contact Information Primary Emergency Contact: [place] of [place] Phone: [phone] Mobile Phone: [phone] Relation: Daughter Secondary Emergency Contact: Hubert Azure, [place] [address] Johnnette Litter of [place] Phone: [phone] Work Phone: [phone] Mobile Phone: [phone] Relation: Other  Code Status:  DNR Goals of care: Advanced Directive information Advanced Directives 05/21/2017  Does Patient Have a Medical Advance Directive? Yes  Type of Advance Directive Out of facility DNR (pink MOST or yellow form);[place];Living will  Does patient want to make changes to medical advance directive? -  Copy of [place] in Chart? Yes  Pre-existing out of facility DNR order (yellow form or pink MOST form) Yellow form placed in chart (order not valid for inpatient use);Pink MOST form placed in chart (order not valid for inpatient use)     Chief Complaint  Patient presents with  . Medical Management of Chronic Issues    HPI:  Pt is a 83 y.o. female seen today for medical management of chronic diseases.   She resides in memory care due to dementia and is followed by hospice due to weight loss, dysphagia, dementia, and over all decline. She has lichen sclerosis which has been difficult to treat.  She uses clobetasol ointment but experienced an increase in redness so the following orders were written: 10/15 Clobetasol increase to BID for 7days 10/18 Diflucan ordered x 2 doses 48 hrs apart. He skin looks much better and she is having no discomfort. Her caregiver is with her and reports she has been eating better and walking with PT.  There are no reports of difficulty swallowing which had been an issue.   Her weight has been on a steady decline due to decreased intake/ Wt Readings from Last 3 Encounters:  06/26/17 144 lb (65.3 kg)  05/21/17 149 lb (67.6 kg)  05/20/17 149 lb 9.6 oz (67.9 kg)     Past Medical History:  Diagnosis Date  . Abnormality of gait 2008   re: metoclopramide.   . Alopecia areata   . Anal fissure   . Arthritis   . B12 deficiency   . Cataracts, bilateral   . Closed fracture of lateral malleolus   . Depression   . Diabetes mellitus without complication ([place])   . Diffuse cystic mastopathy 1997   s/p right breast bx, benign  . Digestive-genital tract fistula, female   . Diverticulosis of colon (without mention of hemorrhage)   . Dizziness and giddiness    benign positional vertigo  . Dysphagia, pharyngoesophageal phase   . GERD (gastroesophageal reflux disease)   . Hyperlipidemia   . Hypertension   . Insomnia, unspecified   . Lumbago 2001   lumbar radiculopathy L3, spondylosis L2-3, 3-4. dx Dr.Cram  . Mild cognitive impairment with memory loss  12/16/2013  . Other lichen, not elsewhere classified 6712   Lichen sclerosis perineum/perianal  . Other malaise and fatigue   . Other psoriasis   . Other specified diseases of blood and blood-forming organs(289.89)    s/p Chelation treatment  . Skin cancer   . Thyroid disease   . Unspecified constipation   . Ventral  hernia, unspecified, without mention of obstruction or gangrene    Past Surgical History:  Procedure Laterality Date  . APPENDECTOMY/OVARIAN CYSTECTOMY  1950s  . BREAST BIOPSY  1997   right benign  . BREAST LUMPECTOMY  1997  . BUNIONECTOMY Bilateral 1975   twice  . CATARACT EXTRACTION W/ INTRAOCULAR LENS  IMPLANT, BILATERAL  1990  . COLECTOMY  11/2007   Sigmoid w/closure of fistula and repair of umbilical hernia  . CYSTECTOMY  1950's   brachial cleft  . HERNIA REPAIR    . TONSILLECTOMY AND ADENOIDECTOMY  1942  . VAGINAL HYSTERECTOMY  1998    Allergies  Allergen Reactions  . Codeine     Unknown   . Erythromycin     Unknown     Outpatient Encounter Prescriptions as of 06/24/2017  Medication Sig  . acetaminophen (TYLENOL) 325 MG tablet Take 650 mg by mouth every 4 (four) hours as needed.  Marland Kitchen acetaminophen (TYLENOL) 650 MG suppository Place 650 mg rectally every 4 (four) hours as needed.  . Calcium Carbonate-Vitamin D 600-400 MG-UNIT per chew tablet Chew 1 tablet by mouth 2 (two) times daily.  . Cholecalciferol (VITAMIN D3) 2000 units TABS Take 1 tablet by mouth daily.  . clobetasol ointment (TEMOVATE) 4.58 % Apply 1 application topically 2 (two) times a week.  . conjugated estrogens (PREMARIN) vaginal cream Place 1 Applicatorful vaginally 3 (three) times a week.  . cyanocobalamin 1000 MCG tablet Take 1,000 mcg by mouth daily.   Marland Kitchen docusate sodium (COLACE) 100 MG capsule Take 100 mg by mouth 2 (two) times daily as needed for mild constipation.  Marland Kitchen levothyroxine (SYNTHROID, LEVOTHROID) 75 MCG tablet Take 62.5 mcg by mouth daily before breakfast.   . Melatonin 5 MG CAPS Take 5 mg by mouth.  . memantine (NAMENDA) 10 MG tablet Take 10 mg by mouth 2 (two) times daily.  . Menthol-Zinc Oxide (CALMOSEPTINE) 0.44-20.6 % OINT Apply 1 application topically daily as needed.  . Nutritional Supplements (NUTRITIONAL SUPPLEMENT PLUS PO) Take 1 scoop by mouth daily.  . Probiotic Product (RISA-BID  PROBIOTIC) TABS Take 1 tablet by mouth daily.  . Turmeric 500 MG TABS Take 1 tablet by mouth daily.  . Zinc Oxide (BOUDREAUXS BUTT PASTE EX) Apply 1 application topically daily.   No facility-administered encounter medications on file as of 06/24/2017.     Review of Systems  Constitutional: Positive for appetite change and unexpected weight change. Negative for activity change, chills, diaphoresis, fatigue and fever.  HENT: Negative for congestion.   Respiratory: Negative for cough, shortness of breath and wheezing.   Cardiovascular: Negative for chest pain, palpitations and leg swelling.  Gastrointestinal: Negative for abdominal distention, abdominal pain, constipation and diarrhea.  Genitourinary: Negative for difficulty urinating and dysuria.  Musculoskeletal: Positive for gait problem. Negative for arthralgias, back pain, joint swelling and myalgias.  Neurological: Positive for weakness. Negative for dizziness, tremors, seizures, syncope, facial asymmetry, speech difficulty, light-headedness, numbness and headaches.  Psychiatric/Behavioral: Positive for confusion. Negative for agitation and behavioral problems.    Immunization History  Administered Date(s) Administered  . Influenza Inj Mdck Quad Pf 06/21/2016  . Influenza Whole 06/03/2012, 06/17/2013  .  Influenza-Unspecified 06/09/2014, 06/16/2015  . PPD Test 09/29/2012  . Pneumococcal Conjugate-13 06/14/2016  . Pneumococcal Polysaccharide-23 09/03/1998  . Td 09/03/1998, 09/25/2016  . Tdap 09/25/2016  . Zoster 09/04/2007   Pertinent  Health Maintenance Due  Topic Date Due  . OPHTHALMOLOGY EXAM  06/30/1937  . URINE MICROALBUMIN  06/30/1937  . HEMOGLOBIN A1C  12/13/2016  . INFLUENZA VACCINE  04/03/2017  . FOOT EXAM  09/24/2017  . DEXA SCAN  Completed  . PNA vac Low Risk Adult  Completed   Fall Risk  06/19/2016 01/19/2015 12/16/2013  Falls in the past year? Yes Yes No  Number falls in past yr: 1 1 -  Injury with Fall? No No  -  Risk for fall due to : Other (Comment) - -  Follow up Education provided - -   Functional Status Survey:    Vitals:   06/26/17 1417  BP: 107/66  Pulse: 88  Resp: 20  Temp: 98.2 F (36.8 C)  Weight: 144 lb (65.3 kg)   Body mass index is 22.55 kg/m. Physical Exam  Constitutional: No distress.  HENT:  Head: Normocephalic and atraumatic.  Neck: No JVD present.  Cardiovascular: Normal rate and regular rhythm.   No murmur heard. Pulmonary/Chest: Effort normal and breath sounds normal. No respiratory distress. She has no wheezes.  Abdominal: Soft. Bowel sounds are normal.  Neurological: She is alert.  Skin: Skin is warm and dry. Rash (erythema to the labial area with small white plaques note (much improved overall). Remains with some redness to the rectal area. ) noted. She is not diaphoretic.  Psychiatric: She has a normal mood and affect.    Labs reviewed:  Recent Labs  04/17/17 04/19/17 05/03/17 1119  NA 138 137 137  K 4.4 3.9 4.2  BUN 22* 21 21  CREATININE 1.4* 1.2* 1.4*    Recent Labs  01/11/17 0630 05/03/17 1119  AST 18 21  ALT 10 19  ALKPHOS 39 48    Recent Labs  03/04/17 0600 04/17/17 05/03/17 1119  WBC 5.4 8.5 10.8  HGB 13.4 12.5 11.5*  HCT 41 37 33*  PLT 155 152 161   Lab Results  Component Value Date   TSH 0.70 06/14/2016   Lab Results  Component Value Date   HGBA1C 4.0 06/14/2016   Lab Results  Component Value Date   CHOL 175 09/16/2014   HDL 43 09/16/2014   LDLCALC 82 09/16/2014   TRIG 249 (A) 09/16/2014   CHOLHDL 3.5 07/09/2012    Significant Diagnostic Results in last 30 days:  No results found.  Assessment/Plan  1. Gait abnormality Continue to work with PT as she is able [facility] with a walker and assistance  2. Vascular dementia without behavioral disturbance Progressive decline in cognition and function with weight loss Goals of care are comfort based  3. Lichen sclerosus et atrophicus Improved Continue clobetasol  indefinitely per GYN twice weekly  4. Slow transit constipation Continue laxatives 100 mg BID prn   5. Weight loss Due to progressive dementia and dysphagia Continue nutritional supplements, no aggressive care  6. Dysphagia Regular diet due to goals of care Asp prec and 1:1 observation with meals    Family/ staff Communication: discussed with her nurse/caregiver  Labs/tests ordered:  NA

## 2017-06-25 DIAGNOSIS — I10 Essential (primary) hypertension: Secondary | ICD-10-CM | POA: Diagnosis not present

## 2017-06-25 DIAGNOSIS — Z8673 Personal history of transient ischemic attack (TIA), and cerebral infarction without residual deficits: Secondary | ICD-10-CM | POA: Diagnosis not present

## 2017-06-25 DIAGNOSIS — F0150 Vascular dementia without behavioral disturbance: Secondary | ICD-10-CM | POA: Diagnosis not present

## 2017-06-25 DIAGNOSIS — N189 Chronic kidney disease, unspecified: Secondary | ICD-10-CM | POA: Diagnosis not present

## 2017-06-25 DIAGNOSIS — E1159 Type 2 diabetes mellitus with other circulatory complications: Secondary | ICD-10-CM | POA: Diagnosis not present

## 2017-06-25 DIAGNOSIS — I672 Cerebral atherosclerosis: Secondary | ICD-10-CM | POA: Diagnosis not present

## 2017-06-26 ENCOUNTER — Encounter: Payer: Self-pay | Admitting: Adult Health

## 2017-07-04 DEATH — deceased
